# Patient Record
Sex: Male | Born: 1935 | Race: White | Hispanic: No | Marital: Married | Smoking: Former smoker
Health system: Southern US, Community
[De-identification: ages and names within clinical notes are randomized; demographics above are authoritative.]

## PROBLEM LIST (undated history)

## (undated) DIAGNOSIS — I4891 Unspecified atrial fibrillation: Secondary | ICD-10-CM

## (undated) DIAGNOSIS — E039 Hypothyroidism, unspecified: Secondary | ICD-10-CM

## (undated) DIAGNOSIS — I5042 Chronic combined systolic (congestive) and diastolic (congestive) heart failure: Secondary | ICD-10-CM

## (undated) DIAGNOSIS — H4920 Sixth [abducent] nerve palsy, unspecified eye: Principal | ICD-10-CM

## (undated) DIAGNOSIS — I442 Atrioventricular block, complete: Secondary | ICD-10-CM

## (undated) DIAGNOSIS — I10 Essential (primary) hypertension: Secondary | ICD-10-CM

## (undated) DIAGNOSIS — E871 Hypo-osmolality and hyponatremia: Secondary | ICD-10-CM

## (undated) DIAGNOSIS — M810 Age-related osteoporosis without current pathological fracture: Secondary | ICD-10-CM

## (undated) DIAGNOSIS — M519 Unspecified thoracic, thoracolumbar and lumbosacral intervertebral disc disorder: Secondary | ICD-10-CM

## (undated) DIAGNOSIS — Z7901 Long term (current) use of anticoagulants: Secondary | ICD-10-CM

## (undated) DIAGNOSIS — Z435 Encounter for attention to cystostomy: Secondary | ICD-10-CM

## (undated) DIAGNOSIS — J329 Chronic sinusitis, unspecified: Secondary | ICD-10-CM

## (undated) DIAGNOSIS — K219 Gastro-esophageal reflux disease without esophagitis: Secondary | ICD-10-CM

## (undated) DIAGNOSIS — H532 Diplopia: Secondary | ICD-10-CM

## (undated) DIAGNOSIS — H9190 Unspecified hearing loss, unspecified ear: Secondary | ICD-10-CM

## (undated) DIAGNOSIS — M109 Gout, unspecified: Secondary | ICD-10-CM

## (undated) DIAGNOSIS — E669 Obesity, unspecified: Secondary | ICD-10-CM

## (undated) DIAGNOSIS — N452 Orchitis: Secondary | ICD-10-CM

## (undated) DIAGNOSIS — H3530 Unspecified macular degeneration: Secondary | ICD-10-CM

## (undated) DIAGNOSIS — Z950 Presence of cardiac pacemaker: Secondary | ICD-10-CM

## (undated) DIAGNOSIS — J45909 Unspecified asthma, uncomplicated: Secondary | ICD-10-CM

## (undated) DIAGNOSIS — N400 Enlarged prostate without lower urinary tract symptoms: Secondary | ICD-10-CM

## (undated) HISTORY — DX: Diplopia: H53.2

## (undated) HISTORY — DX: Unspecified hearing loss, unspecified ear: H91.90

## (undated) HISTORY — DX: Unspecified macular degeneration: H35.30

## (undated) HISTORY — PX: CATARACT EXTRACTION: SUR2

## (undated) HISTORY — DX: Atrioventricular block, complete: I44.2

## (undated) HISTORY — DX: Unspecified atrial fibrillation: I48.91

## (undated) HISTORY — DX: Sixth (abducent) nerve palsy, unspecified eye: H49.20

## (undated) HISTORY — DX: Gastro-esophageal reflux disease without esophagitis: K21.9

## (undated) HISTORY — PX: TRANSURETHRAL RESECTION OF PROSTATE: SHX73

## (undated) HISTORY — PX: PACEMAKER INSERTION: SHX728

## (undated) HISTORY — PX: OTHER SURGICAL HISTORY: SHX169

---

## 1967-02-28 HISTORY — PX: INGUINAL HERNIA REPAIR: SUR1180

## 1988-02-28 HISTORY — PX: ABLATION: SHX5711

## 1997-06-09 ENCOUNTER — Other Ambulatory Visit: Admission: RE | Admit: 1997-06-09 | Discharge: 1997-06-09 | Payer: Self-pay | Admitting: Cardiology

## 1998-10-23 ENCOUNTER — Emergency Department (HOSPITAL_COMMUNITY): Admission: EM | Admit: 1998-10-23 | Discharge: 1998-10-23 | Payer: Self-pay | Admitting: Emergency Medicine

## 1998-10-24 ENCOUNTER — Encounter: Payer: Self-pay | Admitting: Emergency Medicine

## 2000-07-31 ENCOUNTER — Ambulatory Visit (HOSPITAL_COMMUNITY): Admission: RE | Admit: 2000-07-31 | Discharge: 2000-07-31 | Payer: Self-pay | Admitting: Family Medicine

## 2000-07-31 ENCOUNTER — Encounter: Payer: Self-pay | Admitting: Family Medicine

## 2001-07-11 ENCOUNTER — Encounter (INDEPENDENT_AMBULATORY_CARE_PROVIDER_SITE_OTHER): Payer: Self-pay | Admitting: Specialist

## 2001-07-11 ENCOUNTER — Ambulatory Visit (HOSPITAL_COMMUNITY): Admission: RE | Admit: 2001-07-11 | Discharge: 2001-07-11 | Payer: Self-pay | Admitting: Gastroenterology

## 2003-02-28 DIAGNOSIS — Z950 Presence of cardiac pacemaker: Secondary | ICD-10-CM

## 2003-02-28 HISTORY — DX: Presence of cardiac pacemaker: Z95.0

## 2003-06-29 ENCOUNTER — Encounter: Admission: RE | Admit: 2003-06-29 | Discharge: 2003-06-29 | Payer: Self-pay | Admitting: Cardiology

## 2003-09-16 ENCOUNTER — Encounter: Admission: RE | Admit: 2003-09-16 | Discharge: 2003-09-16 | Payer: Self-pay | Admitting: Orthopedic Surgery

## 2003-11-21 ENCOUNTER — Emergency Department (HOSPITAL_COMMUNITY): Admission: EM | Admit: 2003-11-21 | Discharge: 2003-11-21 | Payer: Self-pay | Admitting: Emergency Medicine

## 2003-11-23 ENCOUNTER — Encounter: Admission: RE | Admit: 2003-11-23 | Discharge: 2003-11-23 | Payer: Self-pay | Admitting: Cardiology

## 2005-02-12 ENCOUNTER — Emergency Department (HOSPITAL_COMMUNITY): Admission: EM | Admit: 2005-02-12 | Discharge: 2005-02-12 | Payer: Self-pay | Admitting: Emergency Medicine

## 2005-07-20 ENCOUNTER — Encounter: Admission: RE | Admit: 2005-07-20 | Discharge: 2005-07-20 | Payer: Self-pay | Admitting: Cardiology

## 2006-07-05 ENCOUNTER — Encounter: Admission: RE | Admit: 2006-07-05 | Discharge: 2006-07-05 | Payer: Self-pay | Admitting: Cardiology

## 2007-07-22 ENCOUNTER — Emergency Department (HOSPITAL_COMMUNITY): Admission: EM | Admit: 2007-07-22 | Discharge: 2007-07-22 | Payer: Self-pay | Admitting: Emergency Medicine

## 2007-08-16 ENCOUNTER — Encounter: Admission: RE | Admit: 2007-08-16 | Discharge: 2007-08-16 | Payer: Self-pay | Admitting: Cardiology

## 2007-09-26 ENCOUNTER — Encounter (INDEPENDENT_AMBULATORY_CARE_PROVIDER_SITE_OTHER): Payer: Self-pay | Admitting: Neurosurgery

## 2007-09-26 ENCOUNTER — Ambulatory Visit: Payer: Self-pay | Admitting: Surgery

## 2007-09-26 ENCOUNTER — Ambulatory Visit (HOSPITAL_COMMUNITY): Admission: RE | Admit: 2007-09-26 | Discharge: 2007-09-26 | Payer: Self-pay | Admitting: Neurosurgery

## 2007-10-01 ENCOUNTER — Ambulatory Visit (HOSPITAL_COMMUNITY): Admission: RE | Admit: 2007-10-01 | Discharge: 2007-10-01 | Payer: Self-pay | Admitting: Neurosurgery

## 2007-11-14 DIAGNOSIS — I10 Essential (primary) hypertension: Secondary | ICD-10-CM | POA: Insufficient documentation

## 2007-11-14 DIAGNOSIS — K219 Gastro-esophageal reflux disease without esophagitis: Secondary | ICD-10-CM | POA: Insufficient documentation

## 2007-11-15 ENCOUNTER — Ambulatory Visit: Payer: Self-pay | Admitting: Pulmonary Disease

## 2007-11-15 DIAGNOSIS — J45901 Unspecified asthma with (acute) exacerbation: Secondary | ICD-10-CM

## 2007-11-15 DIAGNOSIS — R05 Cough: Secondary | ICD-10-CM

## 2007-11-15 DIAGNOSIS — R059 Cough, unspecified: Secondary | ICD-10-CM | POA: Insufficient documentation

## 2007-11-15 DIAGNOSIS — R0602 Shortness of breath: Secondary | ICD-10-CM | POA: Insufficient documentation

## 2007-11-29 ENCOUNTER — Inpatient Hospital Stay (HOSPITAL_COMMUNITY): Admission: EM | Admit: 2007-11-29 | Discharge: 2007-11-30 | Payer: Self-pay | Admitting: Emergency Medicine

## 2007-11-29 ENCOUNTER — Ambulatory Visit: Payer: Self-pay | Admitting: Pulmonary Disease

## 2007-11-29 DIAGNOSIS — J45909 Unspecified asthma, uncomplicated: Secondary | ICD-10-CM | POA: Insufficient documentation

## 2007-12-13 ENCOUNTER — Ambulatory Visit: Payer: Self-pay | Admitting: Pulmonary Disease

## 2007-12-13 DIAGNOSIS — J209 Acute bronchitis, unspecified: Secondary | ICD-10-CM | POA: Insufficient documentation

## 2008-01-20 ENCOUNTER — Ambulatory Visit: Payer: Self-pay | Admitting: Pulmonary Disease

## 2008-02-07 ENCOUNTER — Ambulatory Visit: Payer: Self-pay | Admitting: Pulmonary Disease

## 2008-02-07 ENCOUNTER — Ambulatory Visit: Payer: Self-pay | Admitting: Cardiovascular Disease

## 2008-02-07 LAB — CONVERTED CEMR LAB: INR: 1.8 — ABNORMAL HIGH (ref 0.8–1.0)

## 2008-02-17 ENCOUNTER — Telehealth: Payer: Self-pay | Admitting: Pulmonary Disease

## 2008-02-18 ENCOUNTER — Telehealth: Payer: Self-pay | Admitting: Pulmonary Disease

## 2008-03-16 ENCOUNTER — Encounter: Payer: Self-pay | Admitting: Pulmonary Disease

## 2008-04-24 ENCOUNTER — Encounter: Payer: Self-pay | Admitting: Pulmonary Disease

## 2008-06-12 ENCOUNTER — Telehealth: Payer: Self-pay | Admitting: Pulmonary Disease

## 2008-06-17 ENCOUNTER — Ambulatory Visit: Payer: Self-pay | Admitting: Pulmonary Disease

## 2008-06-17 DIAGNOSIS — J329 Chronic sinusitis, unspecified: Secondary | ICD-10-CM | POA: Insufficient documentation

## 2008-06-18 ENCOUNTER — Encounter: Payer: Self-pay | Admitting: Pulmonary Disease

## 2008-07-06 ENCOUNTER — Emergency Department (HOSPITAL_COMMUNITY): Admission: EM | Admit: 2008-07-06 | Discharge: 2008-07-07 | Payer: Self-pay | Admitting: Emergency Medicine

## 2008-08-05 ENCOUNTER — Emergency Department (HOSPITAL_COMMUNITY): Admission: EM | Admit: 2008-08-05 | Discharge: 2008-08-05 | Payer: Self-pay | Admitting: Emergency Medicine

## 2008-08-05 ENCOUNTER — Encounter: Payer: Self-pay | Admitting: Pulmonary Disease

## 2008-08-19 ENCOUNTER — Ambulatory Visit: Payer: Self-pay | Admitting: Pulmonary Disease

## 2008-12-17 ENCOUNTER — Ambulatory Visit: Payer: Self-pay | Admitting: Pulmonary Disease

## 2009-05-26 ENCOUNTER — Encounter (INDEPENDENT_AMBULATORY_CARE_PROVIDER_SITE_OTHER): Payer: Self-pay | Admitting: *Deleted

## 2010-03-29 NOTE — Letter (Signed)
SummaryBiochemist, clinical Pulmonary Care Appointment Letter  [facility]  [address], [place] [address]   Phone: [phone]  Fax: [phone]    05/26/2009 MRN: [id number]  [facility] [address] [place], [place]  [address]  Dear Mr. Resor,   Our office is attempting to contact you about an appointment.  Please call our office at [phone] to schedule this appointment with Dr. Danton Sewer.  You had an appointment schedulled for June 17, 2009 that has been cancelled.  Please call us to reschedule this appointment.  Our registration staff is prepared to assist you with any questions you may have.    Thank you,   Therapist, music Pulmonary Division

## 2010-06-06 LAB — PROTIME-INR
INR: 3.6 — ABNORMAL HIGH (ref 0.00–1.49)
Prothrombin Time: 39.1 seconds — ABNORMAL HIGH (ref 11.6–15.2)

## 2010-06-06 LAB — CBC
HCT: 40 % (ref 39.0–52.0)
MCHC: 33.6 g/dL (ref 30.0–36.0)
MCV: 91.5 fL (ref 78.0–100.0)

## 2010-06-06 LAB — DIFFERENTIAL
Basophils Absolute: 0 10*3/uL (ref 0.0–0.1)
Eosinophils Absolute: 0.1 10*3/uL (ref 0.0–0.7)
Eosinophils Relative: 2 % (ref 0–5)
Lymphocytes Relative: 27 % (ref 12–46)
Lymphs Abs: 1.5 10*3/uL (ref 0.7–4.0)
Monocytes Relative: 8 % (ref 3–12)
Neutro Abs: 3.5 10*3/uL (ref 1.7–7.7)
Neutrophils Relative %: 63 % (ref 43–77)

## 2010-06-06 LAB — POCT I-STAT, CHEM 8
BUN: 22 mg/dL (ref 6–23)
Glucose, Bld: 145 mg/dL — ABNORMAL HIGH (ref 70–99)
HCT: 41 % (ref 39.0–52.0)
TCO2: 27 mmol/L (ref 0–100)

## 2010-06-07 LAB — APTT: aPTT: 44 seconds — ABNORMAL HIGH (ref 24–37)

## 2010-06-07 LAB — POCT I-STAT, CHEM 8
Calcium, Ion: 1.01 mmol/L — ABNORMAL LOW (ref 1.12–1.32)
Hemoglobin: 13.6 g/dL (ref 13.0–17.0)
Potassium: 4.2 mEq/L (ref 3.5–5.1)
Sodium: 132 mEq/L — ABNORMAL LOW (ref 135–145)

## 2010-06-15 ENCOUNTER — Emergency Department (HOSPITAL_COMMUNITY): Payer: Medicare Other

## 2010-06-15 ENCOUNTER — Emergency Department (HOSPITAL_COMMUNITY)
Admission: EM | Admit: 2010-06-15 | Discharge: 2010-06-15 | Disposition: A | Discharge status: Home / Self Care | Payer: Medicare Other | Attending: Emergency Medicine | Admitting: Emergency Medicine

## 2010-06-15 DIAGNOSIS — Z7901 Long term (current) use of anticoagulants: Secondary | ICD-10-CM | POA: Insufficient documentation

## 2010-06-15 DIAGNOSIS — R0789 Other chest pain: Secondary | ICD-10-CM | POA: Insufficient documentation

## 2010-06-15 DIAGNOSIS — I10 Essential (primary) hypertension: Secondary | ICD-10-CM | POA: Insufficient documentation

## 2010-06-15 DIAGNOSIS — Z862 Personal history of diseases of the blood and blood-forming organs and certain disorders involving the immune mechanism: Secondary | ICD-10-CM | POA: Insufficient documentation

## 2010-06-15 DIAGNOSIS — Z79899 Other long term (current) drug therapy: Secondary | ICD-10-CM | POA: Insufficient documentation

## 2010-06-15 DIAGNOSIS — R5383 Other fatigue: Secondary | ICD-10-CM | POA: Insufficient documentation

## 2010-06-15 DIAGNOSIS — I2510 Atherosclerotic heart disease of native coronary artery without angina pectoris: Secondary | ICD-10-CM | POA: Insufficient documentation

## 2010-06-15 DIAGNOSIS — R0602 Shortness of breath: Secondary | ICD-10-CM | POA: Insufficient documentation

## 2010-06-15 DIAGNOSIS — Z8639 Personal history of other endocrine, nutritional and metabolic disease: Secondary | ICD-10-CM | POA: Insufficient documentation

## 2010-06-15 DIAGNOSIS — N289 Disorder of kidney and ureter, unspecified: Secondary | ICD-10-CM | POA: Insufficient documentation

## 2010-06-15 DIAGNOSIS — Z950 Presence of cardiac pacemaker: Secondary | ICD-10-CM | POA: Insufficient documentation

## 2010-06-15 DIAGNOSIS — R110 Nausea: Secondary | ICD-10-CM | POA: Insufficient documentation

## 2010-06-15 DIAGNOSIS — R5381 Other malaise: Secondary | ICD-10-CM | POA: Insufficient documentation

## 2010-06-15 DIAGNOSIS — R42 Dizziness and giddiness: Secondary | ICD-10-CM | POA: Insufficient documentation

## 2010-06-15 LAB — COMPREHENSIVE METABOLIC PANEL
Alkaline Phosphatase: 53 U/L (ref 39–117)
BUN: 30 mg/dL — ABNORMAL HIGH (ref 6–23)
Chloride: 99 mEq/L (ref 96–112)
Glucose, Bld: 117 mg/dL — ABNORMAL HIGH (ref 70–99)
Potassium: 5.1 mEq/L (ref 3.5–5.1)
Total Bilirubin: 0.8 mg/dL (ref 0.3–1.2)

## 2010-06-15 LAB — CBC
HCT: 37.5 % — ABNORMAL LOW (ref 39.0–52.0)
Platelets: 209 10*3/uL (ref 150–400)
RBC: 4.14 MIL/uL — ABNORMAL LOW (ref 4.22–5.81)
RDW: 13.3 % (ref 11.5–15.5)
WBC: 9.3 10*3/uL (ref 4.0–10.5)

## 2010-06-15 LAB — DIFFERENTIAL
Basophils Absolute: 0.1 10*3/uL (ref 0.0–0.1)
Eosinophils Relative: 2 % (ref 0–5)
Lymphocytes Relative: 18 % (ref 12–46)
Neutrophils Relative %: 72 % (ref 43–77)

## 2010-06-15 LAB — PROTIME-INR: INR: 2.85 — ABNORMAL HIGH (ref 0.00–1.49)

## 2010-06-15 LAB — POCT CARDIAC MARKERS: CKMB, poc: <1 ng/mL — ABNORMAL LOW (ref 1.0–8.0)

## 2010-06-15 LAB — D-DIMER, QUANTITATIVE: D-Dimer, Quant: 0.24 ug/mL-FEU (ref 0.00–0.48)

## 2010-06-15 LAB — URINALYSIS, ROUTINE W REFLEX MICROSCOPIC
Ketones, ur: NEGATIVE mg/dL
Nitrite: NEGATIVE
Protein, ur: NEGATIVE mg/dL

## 2010-06-15 LAB — APTT: aPTT: 49 seconds — ABNORMAL HIGH (ref 24–37)

## 2010-06-17 ENCOUNTER — Inpatient Hospital Stay (HOSPITAL_COMMUNITY): Payer: Medicare Other

## 2010-06-17 ENCOUNTER — Inpatient Hospital Stay (HOSPITAL_COMMUNITY)
Admission: AD | Admit: 2010-06-17 | Discharge: 2010-06-18 | DRG: 149 | Disposition: A | Discharge status: Home / Self Care | Payer: Medicare Other | Source: Ambulatory Visit | Attending: Cardiology | Admitting: Cardiology

## 2010-06-17 ENCOUNTER — Encounter (HOSPITAL_COMMUNITY): Payer: Self-pay | Admitting: Radiology

## 2010-06-17 DIAGNOSIS — I10 Essential (primary) hypertension: Secondary | ICD-10-CM | POA: Diagnosis present

## 2010-06-17 DIAGNOSIS — R0789 Other chest pain: Secondary | ICD-10-CM | POA: Diagnosis present

## 2010-06-17 DIAGNOSIS — Z87891 Personal history of nicotine dependence: Secondary | ICD-10-CM

## 2010-06-17 DIAGNOSIS — E785 Hyperlipidemia, unspecified: Secondary | ICD-10-CM | POA: Diagnosis present

## 2010-06-17 DIAGNOSIS — R42 Dizziness and giddiness: Principal | ICD-10-CM | POA: Diagnosis present

## 2010-06-17 DIAGNOSIS — E039 Hypothyroidism, unspecified: Secondary | ICD-10-CM | POA: Diagnosis present

## 2010-06-17 DIAGNOSIS — Z950 Presence of cardiac pacemaker: Secondary | ICD-10-CM

## 2010-06-17 DIAGNOSIS — I4891 Unspecified atrial fibrillation: Secondary | ICD-10-CM | POA: Diagnosis present

## 2010-06-17 DIAGNOSIS — E871 Hypo-osmolality and hyponatremia: Secondary | ICD-10-CM | POA: Diagnosis present

## 2010-06-17 DIAGNOSIS — J45909 Unspecified asthma, uncomplicated: Secondary | ICD-10-CM | POA: Diagnosis present

## 2010-06-17 DIAGNOSIS — Z79899 Other long term (current) drug therapy: Secondary | ICD-10-CM

## 2010-06-17 DIAGNOSIS — Z7901 Long term (current) use of anticoagulants: Secondary | ICD-10-CM

## 2010-06-17 DIAGNOSIS — N400 Enlarged prostate without lower urinary tract symptoms: Secondary | ICD-10-CM | POA: Diagnosis present

## 2010-06-17 DIAGNOSIS — M810 Age-related osteoporosis without current pathological fracture: Secondary | ICD-10-CM | POA: Diagnosis present

## 2010-06-17 DIAGNOSIS — E669 Obesity, unspecified: Secondary | ICD-10-CM | POA: Diagnosis present

## 2010-06-17 DIAGNOSIS — I498 Other specified cardiac arrhythmias: Secondary | ICD-10-CM | POA: Diagnosis present

## 2010-06-17 DIAGNOSIS — N179 Acute kidney failure, unspecified: Secondary | ICD-10-CM | POA: Diagnosis present

## 2010-06-17 HISTORY — DX: Essential (primary) hypertension: I10

## 2010-06-17 HISTORY — DX: Presence of cardiac pacemaker: Z95.0

## 2010-06-17 LAB — BASIC METABOLIC PANEL
CO2: 25 mEq/L (ref 19–32)
Calcium: 8.8 mg/dL (ref 8.4–10.5)
Creatinine, Ser: 1.51 mg/dL — ABNORMAL HIGH (ref 0.4–1.5)
Glucose, Bld: 126 mg/dL — ABNORMAL HIGH (ref 70–99)
Sodium: 128 mEq/L — ABNORMAL LOW (ref 135–145)

## 2010-06-17 LAB — CARDIAC PANEL(CRET KIN+CKTOT+MB+TROPI)
CK, MB: 1.7 ng/mL (ref 0.3–4.0)
Relative Index: INVALID (ref 0.0–2.5)
Relative Index: INVALID (ref 0.0–2.5)
Total CK: 85 U/L (ref 7–232)
Troponin I: 0.02 ng/mL (ref 0.00–0.06)

## 2010-06-17 LAB — TSH: TSH: 3.013 u[IU]/mL (ref 0.350–4.500)

## 2010-06-17 LAB — CBC
MCHC: 34.4 g/dL (ref 30.0–36.0)
RDW: 13.1 % (ref 11.5–15.5)

## 2010-06-21 NOTE — Discharge Summary (Signed)
NAMEELIZEO, KAHLER NO.:  [id number]  MEDICAL RECORD NO.:  [id number]           PATIENT TYPE:  I  LOCATION:  [id number]                         FACILITY:  [facility]  PHYSICIAN:  Jerline Pain, MD      DATE OF BIRTH:  [birth year]  DATE OF ADMISSION:  06/17/2010 DATE OF DISCHARGE:  06/18/2010                              DISCHARGE SUMMARY   PRIMARY CARE PHYSICIAN:  Janalyn Rouse, MD  CARDIOLOGIST:  Ezzard Standing, MD  FINAL DIAGNOSES: 1. Dizziness. 2. Atypical chest pain. 3. Atrial fibrillation. 4. Chronic anticoagulation. 5. Hyperlipidemia. 6. Hypertension. 7. Obesity. 8. Supraventricular tachycardia. 9. Pacemaker.  Discharge medications changes are as follows:  Flecainide has been decreased from 100 mg twice a day to 50 mg twice a day.  Also triamterene/hydrochlorothiazide is currently on hold.  This may need to be restarted as an outpatient.  These were the two medication changes made.  Other medications remain the same and are as follows:  The addition of meclizine 25 mg p.r.n. q.8 h. has also been made by consultant, Dr. Brigitte Pulse, his primary care physician.  DISCHARGE MEDICATIONS: 1. Flecainide 50 mg twice a day. 2. Meclizine 25 mg p.o. q.8 h. p.r.n. 3. Nasonex once per each nostril daily. 4. Loratadine 10 mg once a day. 5. Omeprazole 20 mg once a day. 6. Multivitamins once a day. 7. Finasteride 5 mg one tablet daily. 8. Levothyroxine 75 mcg one tablet daily. 9. Losartan 100 mg once a day. 10.Warfarin 1.5 mg on Thursdays, 3 mg every other day of the week as     previously prescribed. 11.Allopurinol 300 mg once a day. 12.Alendronate 70 mg every Wednesday. 13.Vitamin D3. 14.Citrucel. 15.MiraLax. 16.Rapaflo 8 mg one tablet nightly. 17.Colchicine 0.6 mg once a day. 18.Vicodin 5/500 one tablet q.6 h. 19.Tramadol 50 mg twice a day. 20.Flurazepam one p.o. nightly as needed for sleep. 21.Fish oil 1000 mg once a day. 22.Calcium with vitamin  D. 23.Ocuvite. 24.Symbicort 160/4.5 mcg two puffs inhale b.i.d.  BRIEF HOSPITAL COURSE:  Mr. Hissam is a pleasant 77 year old male who over the past few days has been having complaints of vague malaise, fatigue, dizziness.  He was seen previously in the emergency department, sent home.  He then sought the counsel of [name] and Dr. Brigitte Pulse. Given his Coumadin usage and his vague complaints including some atypical chest discomfort, he was admitted to the hospital for further evaluation.  Head CT was performed, which was negative for bleed.  He is on chronic Coumadin.  His BUN and creatinine was 30 and 1.6 on June 15, 2010 and on discharge was 23 and 1.51.  Orthostatics were performed in the hospital setting and were negative. Lying down, he was 150/82 with a pulse of 75 and standing he was 147/74 with pulse of 74.  He was remained afebrile.  Urinalysis was normal and INR was therapeutic at 2.5 for his atrial fibrillation.  Cardiac biomarkers were also drawn, which were negative.  TSH was normal at 3.0. ESR was slightly elevated at 22 and his BNP was also elevated at 332.  D- dimer was negative.  On morning  of discharge, he actually stated that he was feeling quite well and was eager to go home.  He was ambulating well without any issues.  Dr. Brigitte Pulse saw him in consultation yesterday on June 17, 2010 and recommended gentle hydration as well as p.r.n. meclizine.  Perhaps his dizziness was secondary at the time due to intravascular volume depletion.  [name] also placed his triamterene/hydrochlorothiazide on hold due to increased BUN and creatinine and also decreased his flecainide from 100 twice a day to 50 twice a day.  PHYSICAL EXAMINATION ON MORNING OF DISCHARGE:  VITAL SIGNS:  Pulse 75, respirations 18, blood pressure 129/71, satting 94% on room air. GENERAL:  Alert and oriented x3, in no acute distress, ambulating well. CARDIOVASCULAR:  Regular rate and rhythm.  Soft  systolic murmur. LUNGS:  Clear to auscultation bilaterally. ABDOMEN:  Soft, nontender.  Normoactive bowel sounds. EXTREMITIES:  No clubbing, cyanosis, or edema.  Telemetry unremarkable, occasional atrial pacing, but mostly persistent ventricular pacing.  ASSESSMENT:  No adverse arrhythmias noted.  FOLLOW UP:  I asked him to call both [name] and Dr. Raul Del office early next week for followup appointment.  Head CT as noted above was negative for any bleed, no intracranial process noted.  Discharge time 35 minutes spent with med reconciliation, the patient instruction, review of medical records and labs.     Jerline Pain, MD     MCS/MEDQ  D:  06/18/2010  T:  06/18/2010  Job:  [id number]  cc:   Janalyn Rouse, M.D. Ezzard Standing, M.D.  Electronically Signed by Candee Furbish MD on 06/21/2010 08:56:38 PM

## 2010-06-23 NOTE — Consult Note (Signed)
NAMEAUBREE, [name] NO.:  [id number]  MEDICAL RECORD NO.:  [id number]           PATIENT TYPE:  I  LOCATION:  [id number]                         FACILITY:  [facility]  PHYSICIAN:  Janalyn Rouse, M.D.  DATE OF BIRTH:  Jan 12, 1923  DATE OF CONSULTATION:  06/17/2010 DATE OF DISCHARGE:                                CONSULTATION   CHIEF COMPLAINT:  Dizziness.  REASON FOR CONSULTATION:  Dizziness.  REQUESTING PHYSICIAN:  W. Tollie Eth, MD  HISTORY OF PRESENT ILLNESS:  Mr. Hubbard is an 77 year old white male with a history of atrial fibrillation status post pacemaker on anticoagulation, asthma, and history of syncope who was admitted by [name] for evaluation of atypical chest pain and persistent dizziness. Mr. Marocco was actually seen in the emergency department 2 days ago for evaluation of dizziness.  He spent the weekend in [place] where he spent a lot of time walking.  He states while he was up there he had some intermittent episodes of dizziness.  Two days ago while at the [facility], he developed some additional dizziness, which he described as blurred vision, lightheadedness associated with some chest tightness. He does state that he had some GI upset as well, but no diarrhea.  Given these symptoms, he was evaluated in the emergency department where was found to have a drop in his systolic blood pressure from 135 lying to 119 sitting, and his BUN and creatinine were elevated from baseline.  It was felt that this was most consistent with orthostasis.  He did follow up with me in the office today following this episode, and I concurred that this was most likely orthostasis.  I encouraged him to increase his fluid intake and to call if his symptoms persisted.  The patient states that this morning he developed some left-sided chest pressure that lasted 2-3 hours.  He stated that he wanted to "check out the pacemaker," so the was seen by [name] who recommended  admission for further evaluation.  I was called by [name] for assistance with medical management.  PAST MEDICAL HISTORY: 1. Atrial fibrillation on chronic anticoagulation. 2. Asthma, well controlled. 3. Hypertension. 4. Hypothyroidism. 5. Chronic hyponatremia in the setting of diuretic therapy. 6. BPH. 7. Obesity. 8. Status post pacemaker placement. 9. Chronic low back pain. 10.History of syncope. 11.Impaired fasting glucose/borderline diabetes. 12.Osteoporosis. 13.History of hyperplastic polyps.  SOCIAL HISTORY:  He is remarried to [place].  His first wife is deceased.  He has one son and one daughter and five grandchildren.  He is retired from [employer] since 1984.  Remote tobacco use and no alcohol use.  FAMILY HISTORY:  Father died of an accident at 75.  Mother died of heart failure at 39.  CURRENT MEDICATIONS: 1. Colchicine 0.6 mg p.r.n. gout. 2. Finasteride 5 mg daily. 3. Flecainide 100 mg b.i.d. 4. Levoxyl 75 mcg daily. 5. Prilosec 20 mg daily. 6. Maxzide 37.5/25 daily. 7. Coumadin 3 mg for 6 days then 1/2 on Wednesdays. 8. Symbicort 160/4.5 b.i.d. 9. Xopenex HFA p.r.n. 10.Colace 100 mg daily. 11.Fosamax 70 mg weekly. 12.Vitamin D 1000 units daily. 13.Allopurinol 300 mg daily.  14.Lutein 20 mg daily. 15.Losartan 100 mg daily. 16.Rapaflo 8 mg daily p.r.n. 17.Hydrocodone p.r.n. 18.Fish oil daily. 19.Ultram 50 mg q.8 h. p.r.n. 20.Nasonex p.r.n. (not taken recently). 21.Proctosol-HC p.r.n. 22.Lidoderm patch p.r.n. 23.Claritin 10 mg daily.  PHYSICAL EXAMINATION:  VITAL SIGNS:  Temperature 98.9, pulse 67, respirations 19, blood pressure 129/71, oxygen saturation 94% on room air. GENERAL:  Pleasant, healthy-appearing male in no acute distress. HEENT:  Oropharynx is moist.  Extraocular movements are intact.  No scleral icterus. NECK:  Supple without lymphadenopathy, JVD, or carotid bruits. HEART:  Regular rate and rhythm without murmurs, rubs, or  gallops. LUNGS:  Clear to auscultation bilaterally. ABDOMEN:  Soft, nondistended, nontender with normoactive bowel sounds. EXTREMITIES:  No clubbing, cyanosis, or edema. NEUROLOGIC:  Alert and oriented x4.  Motor strength 5/5 in all extremities.  No ataxia or finger-to-nose abnormalities.  LABORATORY DATA:  CBC shows a white count of 9.0, hemoglobin 12.2, platelets 194.  BMET shows sodium 128, potassium 4.5, chloride 97, bicarb 25, BUN 23, creatinine 1.5, glucose 126, troponin 0.02.  ASSESSMENT/PLAN: 1. Dizziness - his history and recent evaluation are most consistent     with orthostasis in the setting of diuretic use and mild volume     depletion with recent travel.  I do agree with the head CT to rule     out bleed given the persistence of symptoms in the setting of     Coumadin therapy.  Differential diagnosis does include mild vertigo     secondary to allergies.  We will continue his Claritin and Flonase     and add meclizine. 2. Asthma - this is well controlled.  We will continue his home     medications. 3. Acute renal failure - he has had a slight increase in his     creatinine from baseline.  We will monitor with gentle hydration. 4. Atypical chest pain per [name]. 5. Disposition - per [name]. 6. I appreciate the care by [name], and we will follow his     inpatient course and provide close outpatient followup upon     discharge.     Janalyn Rouse, M.D.     WS/MEDQ  D:  06/17/2010  T:  06/18/2010  Job:  [id number]  cc:   Ezzard Standing, M.D.  Electronically Signed by Viona Gilmore. Lutricia Feil M.D. on 06/23/2010 08:48:53 AM

## 2010-07-12 NOTE — Discharge Summary (Signed)
[name], MOSKWA NO.:  [id number]   MEDICAL RECORD NO.:  [id number]          PATIENT TYPE:  INP   LOCATION:  [id number]                         FACILITY:  [facility]   PHYSICIAN:  Ezzard Standing, M.D.DATE OF BIRTH:  1923/01/22   DATE OF ADMISSION:  11/29/2007  DATE OF DISCHARGE:  11/30/2007                               DISCHARGE SUMMARY   FINAL DIAGNOSES:  1. Syncopal episode, presumed vasovagal.      a.     No arrhythmias noted.  The pacemaker evaluation shows normal       pacemaker function with no tachyarrhythmia previous.  2. Paroxysmal atrial fibrillation, currently in sinus rhythm.  3. Over anticoagulation with Coumadin.  4. Hypertension.  5. History of supraventricular tachycardia.  6. Previous history of asthmatic bronchitis with recent exacerbation.   HISTORY:  The patient is an 77 year old male who has had a 42-month  history of asthmatic bronchitis with exacerbation.  He has received  multiple courses of antibiotics as well as steroids recently.  He was in  his usual state of health and went for routine visit to the doctor's  office yesterday.  He was seen by the doctor and then afterwards while  sitting in an examination table, he then got up, went, found a chair and  had promontory symptoms of nausea, sweating, and diaphoresis and then  had a syncopal episode.  He was assisted before and regained  consciousness.  By the time they got the crash card and there was blood  pressure returned normal and he had regained consciousness and was  transported here.  Please see the previously dictated history and  physical for remainder of the details.   HOSPITAL COURSE:  On admission, his white count is 15,400, hemoglobin is  13.6, hematocrit is 40.  Protime INR was 4.1, sodium is 128, potassium  4.3, chloride 93, CO2 27, BUN 25, creatinine 1.2, glucose 150.  Liver  enzymes were normal.  TSH is 4.667.  CPK-MB and troponin were all  normal.   The patient was  observed and his Coumadin was held and his Flomax was  held.  It was noted that he was taken intermittent Flomax dosages and we  asked him to stop taking this.  His symptoms improved and he had no  arrhythmias.  Pacemaker evaluation showed normal pacemaker function.  Last episode of mode switching was on November 21, 2007.  It was  thought the most likely etiology of his syncope was vasovagal, likely  due also to the recent illness that he had as well as the Flomax that he  had taken the night before, which he was taken only on intermittent  basis.  He is discharged at this time in improved condition.  He does  have hyponatremia and we will stop his diuretics at this time.  He is  discharged on:  1. Flecainide 100 mg twice a day.  2. Levothroid 0.05 mg daily.  3. Prilosec 20 mg daily.  4. Lutein 45 mg daily.  5. Diovan 160 mg daily.  6. Stool softener daily.  7. Multivitamins daily.  8. Finasteride  5 mg nightly.  9. He is to hold Coumadin and have a repeat protime on Monday.  10.Flomax.  11.He is to discontinue colchicine 0.6 mg daily.   He is to follow up with a visit for protime and a 30-day event monitor  on Monday.  He will also have an echocardiogram done as an outpatient it  was the one ordered while he was in the hospital, it has not been done  yet.      Ezzard Standing, M.D.  Electronically Signed     WST/MEDQ  D:  11/30/2007  T:  11/30/2007  Job:  [id number]   cc:   Janalyn Rouse, M.D.

## 2010-07-12 NOTE — H&P (Signed)
[name], [name] NO.:  [id number]   MEDICAL RECORD NO.:  [id number]          PATIENT TYPE:  INP   LOCATION:  [place]                         FACILITY:  [facility]   PHYSICIAN:  Ezzard Standing, M.D.DATE OF BIRTH:  18-Nov-1922   DATE OF ADMISSION:  11/29/2007  DATE OF DISCHARGE:                              HISTORY & PHYSICAL   REASON FOR ADMISSION:  Syncope.   HISTORY:  The patient is a 77 year old male who has a previous history  of paroxysmal atrial fibrillation, hypertension, hyperlipidemia and a  history of supraventricular tachycardia.  He was last seen in June.  He  has a previous history of a [employer] permanent pacemaker implantation  in March of 2005 and has also had paroxysmal atrial fibrillation.  He  has a history of supraventricular tachycardia with an AV node ablation  for this at [facility] in 1988.  He has had previous cardiac  catheterization in 1989 that showed no significant coronary artery  disease and is followed.  He was last seen in June at which time he was  having severe low back pain.  He has been evaluated since then with  normal arterial brachial indices and was seen by Hosie Spangle,  M.D. who felt that he multilevel degenerative disease of the spine that  was not amenable to surgery and recommended medical treatment.  His  pacemaker was checked recently and he was having minimal mode switching.  Over the past months he has had developed severe bronchitis which was  asthmatic bronchitis and was treated with multiple courses of  antibiotics by Janalyn Rouse, M.D. including prednisone.  He received  Zithromax and Avelox, as well as other antibiotics and was eventually  referred to Kathee Delton, MD, [facility] who treated him with prolonged  course of steroids and ultimately resulted in improvement in his  symptoms.  He was at Dr. Janifer Adie office today for routine visit and Dr.  Gwenette Greet wanted him instructed in inhaler therapy.  The  patient had been  sitting on the examination table, got up and moved to a chair in the  examination room and had been sitting there a while and then began to  feel somewhat diaphoretic and sweaty and then dizzy and had a syncopal  episode in the chair.  The wife stepped out and called the nurse who  came into the room and they assisted the patient to the floor after  which he regained consciousness.  He was reported as being apneic and  pulseless, but no CPR was done and when they hooked the monitor up to  him he was in a regular rhythm and had a blood pressure of 120/80 and  was transported to the emergency room.  Evaluation in the emergency room  has shown a paced ventricular rhythm and he also has had a sodium of  128, a white count of 15,000.  Pro time INR was 4.  I was asked to see  him.  The patient has not had any recent syncopal episodes and denies  any significant chest pain.  He has no PND or  orthopnea.  He did take a  dose of Flomax last evening and did eat breakfast today.  He takes  Flomax intermittently for symptoms of BPH.  He has been on chronic  Coumadin therapy without complications.   PAST MEDICAL HISTORY:  Medical:  1. Previous history of hypertension.  2. Obesity.  3. History of BPH.  4. Asthma.  5. Severe lumbar disk disease.  6. Gout.  7. Previous history of hypothyroidism.  8. Previous cardiovascular illnesses include a history of      supraventricular tachycardia with previous history of AV nodal      ablation for SVT in [facility] in 1988 by Dr. Rolland Porter.  A pacemaker was      implanted in [place] for a second-degree heart block, April of 2005   Previous surgery:  1. Inguinal herniorrhaphy bilaterally.  2. TURP.   ALLERGIES:  SULFONAMIDES.   CURRENT MEDICATIONS:  1. Prilosec OTC daily.  2. Diovan 160 mg daily.  3. Flecainide 100 b.i.d.  4. Hydrochlorothiazide and triamterene 25/37.5 daily.  5. Levothyroxine 0.05 mg daily.  6. Lutein 45 mg daily.  7.  Surfak daily.  8. Warfarin 2.5 mg daily except for 5 mg twice weekly.  9. Colchicine 0.6 mg daily.  10.Finasteride daily.  11.He does take intermittent Flomax also.   SOCIAL HISTORY:  He is a retired Corporate treasurer.  He  has never smoked.  He does not use alcohol to excess.  Currently lives  with his second wife.   FAMILY HISTORY:  Negative for premature cardiac disease.   REVIEW OF SYSTEMS:  He has been obese for several years.  He denies  significant eye complaints or eye problems.  He has some moderate  dyspnea with exertion.  No definite chest pain suggestive of angina.  He  has a history of mild esophageal reflux type symptoms in the past and a  history of some mild dyspepsia.  He has a history of an elevated PSA.  He has had prostatitis and urinary retention in the past and  intermittently will take Flomax.  He has a history of significant lumbar  disk disease and also has plantar fasciitis by history.  He has no  history of headaches or seizures other than as noted above.  The  remainder of systems is unremarkable.   PHYSICAL EXAMINATION:  He is a pleasant male appearing younger than  stated age.  Blood pressure standing by me in the emergency room was 140/70, pulse  was 70 and regular.  His skin was warm and dry.  There was no obvious signs of trauma.  HEENT:  EOMI.  PERLLA.  CNS clear.  Fundi not examined.  Pharynx  negative.  NECK:  Supple without masses, JVD, thyromegaly or bruits.  LUNGS:  Clear to A and P.  CARDIOVASCULAR EXAM:  Shows normal S1 and S2.  Tere is no S3-S4.  There  is a soft 1/6 systolic murmur.  ABDOMEN:  Soft and nontender.  Femoral distal pulses are 2+.  There was  no peripheral edema noted.   The 12-lead EKG shows a paced rhythm.   Lab data is as noted in the history and physical.   IMPRESSION:  1. Syncope that by description sounds vasovagal and by description the      recent dose of Flomax may have contributed to this.  2.  Hypertension.  3. Hyponatremia.  4. Over-anticoagulation with Coumadin.  5. History of ablation for supraventricular tachycardia.  6. History of second-degree  block and implantation of pacemaker.  7. Atrial fibrillation, paroxysmal on flecainide.  8. Obesity.  9. Chronic low back pain.  10.Gout.  11.Hypothyroidism.  12.Prostatism and benign prostate hypertrophy.   RECOMMENDATIONS:  The patient will have an echocardiogram.  We will hold  his Coumadin at this time and also hold his Maxzide.  Follow-up  hyponatremia.  Obtain orthostatic blood pressures and rule out  myocardial infarction although I doubt this is a clinical event.  Recommended that he likely just has a vagal reaction that may have been  exacerbated by Flomax.  We will obtain a pacer interrogation and  determine if there are any issues there.      Ezzard Standing, M.D.  Electronically Signed     WST/MEDQ  D:  11/29/2007  T:  11/29/2007  Job:  [id number]   cc:   Janalyn Rouse, M.D.

## 2010-07-15 NOTE — Consult Note (Signed)
NAMESEVERO, BRIAN NO.:  [id number]   MEDICAL RECORD NO.:  [id number]          PATIENT TYPE:  EMS   LOCATION:  [place]                         FACILITY:  [facility]   PHYSICIAN:  Kaylyn Lim., M.D.DATE OF BIRTH:  11-10-1922   DATE OF CONSULTATION:  11/21/2003  DATE OF DISCHARGE:                                   CONSULTATION   HISTORY OF PRESENT ILLNESS:  A 77 year old white male with a past medical  history of atrial fibrillation (status post AV node ablation), permanent  pacemaker placement, hypertension who presents for evaluation of shortness  of breath.  The patient reports recent trip to the [place] and [place].  Since that time, he has been having productive cough, increased shortness of  breath, malaise, and wheezing.  Reports that his chest has been full,  worse with exertion.  Tried Cipro without significant improvement.  Also  reports twitchy airways of the same time period.  Question plus / minus  orthopnea, no PND.  No lower extremity edema.  No chest pain.   CURRENT MEDICATIONS:  Current medications are Diovan, Cardura and Nexium.   ALLERGIES:  None.   SOCIAL HISTORY:  He is married.  No tobacco or alcohol.   FAMILY HISTORY:  Denies coronary disease.   PHYSICAL EXAMINATION:  VITAL SIGNS:  Afebrile, pulse in the 60s, telemetry  shows ventricular paced blood pressure 140/70, sats were 95%.  GENERAL:  Alert and oriented times four.  Elderly white male in no acute  distress.  NECK:  Supple.  No lymphadenopathy.  There are 2+ carotids.  No JVD.  LUNGS:  Bilateral rhonchi, primarily in the bases with wheezing.  HEART:  Regular.  ABDOMEN:  Soft, nontender, nondistended.  EXTREMITIES:  Warm, 2+ pulses and only trace edema.   LABORATORY DATA:  Significant laboratory values show a white count of 5.1,  hemoglobin of 12.2, platelet count 167, BUN and creatinine 21 and 1.2.  LFTs  were normal.  BNP is 51, cardiac enzymes are negative times two.  EKG  is  prepaced.  Chest x-ray shows no acute disease and pacemaker in proper  position.   IMPRESSION:  Bronchitis.   PLAN:  Recommend starting prednisone, azithromycin and Atrovent.  Patient  reports shakes with albuterol.  Prescriptions were called in to [facility].  Patient to follow up in one to two weeks with Dr. Raelene Bott.  He was instructed to notify the office should his breathing worsen or his  symptoms do not improve within the next 24 hours.       TWK/MEDQ  D:  11/21/2003  T:  11/22/2003  Job:  [id number]

## 2010-07-15 NOTE — Procedures (Signed)
[facility]  Patient:    [name], GAIR Visit Number: [id number] MRN: [id number]          Service Type: END Location: ENDO Attending Physician:  Orvis Brill Dictated by:   Jeryl Columbia, M.D. Proc. Date: 07/11/01 Admit Date:  07/11/2001   CC:         Biagio Borg, M.D.   Procedure Report  PROCEDURE:  Colonoscopy with biopsy.  INDICATION:  Patient with history of colon polyps, due for repeat screening. Consent was signed after risks, benefits, methods, and options thoroughly discussed in the office on multiple occasions.  MEDICATIONS:  Demerol 60, Versed 6.  DESCRIPTION OF PROCEDURE:  Rectal inspection was pertinent for external hemorrhoids, small.  Digital exam was negative.  The video colonoscope was inserted, easily advanced around the colon to the cecum.  This did require some abdominal pressure but no position changes.  On insertion, some left-sided diverticula were seen.  The cecum was identified by the appendiceal orifice and the ileocecal valve.  The scope was slowly withdrawn.  On slow withdrawal through the colon, the cecum and the ascending and the transverse were normal.  There was an occasional left-sided diverticula.  On the left side of the colon, probably in the distal descending, two tiny, probable hyperplastic-appearing polyps were seen and were each cold biopsied x 2.  The scope was further withdrawn through the sigmoid. Occasional diverticula were seen in the distal sigmoid and rectum.  A few other hyperplastic-appearing polyps were seen and were cold biopsied as well and put in with the other polyps.  The scope was withdrawn back to the rectum and retroflexed, pertinent for some internal hemorrhoids.  The scope was straightened and readvanced a short ways up the left side of the colon; air was suctioned and the scope removed.   The patient tolerated the procedure well.  There was no obvious immediate  complication.  ENDOSCOPIC DIAGNOSES: 1. Internal/external hemorrhoids. 2. Left occasional diverticula. 3. Rectal distal sigmoid and probably distal descending, probably    hyperplastic-appearing polyps, all cold biopsied. 4. Otherwise within normal limits to the cecum.  PLAN: 1. Await pathology but probably recheck screening in five years if doing well    medically. 2. Otherwise return care to Dr. Valere Dross for the customary health care    maintenance to include yearly rectals and guaiacs. 3. I would be happy to see back sooner p.r.n. Dictated by:   Jeryl Columbia, M.D. Attending Physician:  Orvis Brill DD:  07/11/01 TD:  07/13/01 Job: [phone] [id number]

## 2010-08-25 ENCOUNTER — Telehealth: Payer: Self-pay | Admitting: Pulmonary Disease

## 2010-08-25 MED ORDER — BUDESONIDE-FORMOTEROL FUMARATE 160-4.5 MCG/ACT IN AERO: 2.0000 | INHALATION_SPRAY | Freq: Two times a day (BID) | RESPIRATORY_TRACT | Status: DC

## 2010-08-25 NOTE — Telephone Encounter (Signed)
I spoke with the pt and advised that he has not been seen since 2010 so we cannot refill a med unless he makes an appt. Pt states he doe snot need an appt. I again advised of the above. He set an appt and I sent rx. i advised the pt that he must keep appt to get further refills. He states understanding. Culbertson Bing, CMA

## 2010-08-25 NOTE — Telephone Encounter (Signed)
LMTCBx1. Pt last seen 2010 with no pending appt. Burkburnett Bing, CMA

## 2010-08-25 NOTE — Telephone Encounter (Signed)
Pt called back- he asks for 90 day supply. Me

## 2010-08-25 NOTE — Telephone Encounter (Signed)
PATIENT RETURNED CALL.  I INFORMED HIM THAT HE NEEDED TO MAKE APPOINTMENT, BUT HE WANTS SYMBICORT WITHOUT MAKING ONE.  HE WANTS JENNIFER TO CALL HIM BACK.

## 2010-09-13 ENCOUNTER — Encounter: Payer: Self-pay | Admitting: Pulmonary Disease

## 2010-09-14 ENCOUNTER — Encounter: Payer: Self-pay | Admitting: Pulmonary Disease

## 2010-09-14 ENCOUNTER — Ambulatory Visit (INDEPENDENT_AMBULATORY_CARE_PROVIDER_SITE_OTHER): Payer: Medicare Other | Admitting: Pulmonary Disease

## 2010-09-14 VITALS — BP 132/72 | HR 86 | Temp 97.5°F | Ht 72.0 in | Wt 227.8 lb

## 2010-09-14 DIAGNOSIS — J45909 Unspecified asthma, uncomplicated: Secondary | ICD-10-CM

## 2010-09-14 MED ORDER — LEVALBUTEROL TARTRATE 45 MCG/ACT IN AERO: 2.0000 | INHALATION_SPRAY | RESPIRATORY_TRACT | Status: DC | PRN

## 2010-09-14 NOTE — Patient Instructions (Signed)
No change in medications for your asthma.  followup with me as needed.

## 2010-09-14 NOTE — Progress Notes (Signed)
  Subjective:    Patient ID: [name], male    DOB: 05-29-1922, 77 y.o.   MRN: [id number]  HPI The pt comes in today for pulmonary f/u for his known asthma.  He has not been seen in awhile, but has been doing very well.  He rarely uses his rescue inhaler, and has not had an acute exacerbation since the last visit here.  He denies cough or congestion.   Review of Systems  Constitutional: Negative for fever and unexpected weight change.  HENT: Positive for congestion, rhinorrhea and postnasal drip. Negative for ear pain, nosebleeds, sore throat, sneezing, trouble swallowing, dental problem and sinus pressure.   Eyes: Negative for redness and itching.  Respiratory: Negative for cough, chest tightness, shortness of breath and wheezing.   Cardiovascular: Negative for palpitations and leg swelling.  Gastrointestinal: Negative for nausea and vomiting.  Genitourinary: Negative for dysuria.  Musculoskeletal: Negative for joint swelling.  Skin: Negative for rash.  Neurological: Negative for headaches.  Hematological: Does not bruise/bleed easily.  Psychiatric/Behavioral: Negative for dysphoric mood. The patient is not nervous/anxious.        Objective:   Physical Exam Ow male in nad Chest with clear bs throughout, no wheezing, no rhonchi Cor with rrr LE without signficant edema, no cyanosis Alert, oriented, moves all 4        Assessment & Plan:

## 2010-09-17 NOTE — Assessment & Plan Note (Signed)
The pt is doing well on symbicort, with no acute exacerbations or excessive rescue inhaler use.  I have asked him to stay on his meds, and have encouraged him to work on weight loss and conditioning.

## 2010-11-23 LAB — POCT I-STAT, CHEM 8
BUN: 34 — ABNORMAL HIGH
Calcium, Ion: 1.19
Chloride: 97
Creatinine, Ser: 1.7 — ABNORMAL HIGH
Glucose, Bld: 109 — ABNORMAL HIGH
HCT: 43
Hemoglobin: 14.6
Potassium: 3.8
Sodium: 134 — ABNORMAL LOW
TCO2: 30

## 2010-11-23 LAB — DIFFERENTIAL
Eosinophils Absolute: 0.2
Lymphocytes Relative: 39
Lymphs Abs: 3.5
Neutro Abs: 4.3
Neutrophils Relative %: 48

## 2010-11-23 LAB — POCT CARDIAC MARKERS
CKMB, poc: 1.2
CKMB, poc: 1.6
Myoglobin, poc: 106
Myoglobin, poc: 122
Operator id: 277751
Troponin i, poc: <0.05

## 2010-11-23 LAB — CBC
Platelets: 221
RBC: 4.55
WBC: 8.9

## 2010-11-25 LAB — PROTIME-INR
INR: 1.1
Prothrombin Time: 14

## 2010-11-25 LAB — APTT: aPTT: 33

## 2010-11-28 LAB — BASIC METABOLIC PANEL
CO2: 27
Calcium: 8.9
GFR calc Af Amer: >60
Potassium: 4.3
Sodium: 128 — ABNORMAL LOW

## 2010-11-28 LAB — URINALYSIS, ROUTINE W REFLEX MICROSCOPIC
Glucose, UA: NEGATIVE
Hgb urine dipstick: NEGATIVE
Protein, ur: NEGATIVE
Specific Gravity, Urine: 1.009

## 2010-11-28 LAB — PROTIME-INR
INR: 4.2 — ABNORMAL HIGH
Prothrombin Time: 44.7 — ABNORMAL HIGH

## 2010-11-28 LAB — POCT CARDIAC MARKERS
CKMB, poc: <1 — ABNORMAL LOW
Myoglobin, poc: 76.9
Troponin i, poc: <0.05

## 2010-11-28 LAB — POCT I-STAT, CHEM 8
BUN: 30 — ABNORMAL HIGH
Chloride: 94 — ABNORMAL LOW
Creatinine, Ser: 1.5
Potassium: 4
Sodium: 128 — ABNORMAL LOW

## 2010-11-28 LAB — CARDIAC PANEL(CRET KIN+CKTOT+MB+TROPI)
Relative Index: INVALID
Relative Index: INVALID
Total CK: 21

## 2010-11-28 LAB — DIFFERENTIAL
Eosinophils Relative: 1
Lymphocytes Relative: 11 — ABNORMAL LOW
Monocytes Absolute: 1.3 — ABNORMAL HIGH
Monocytes Relative: 8
Neutro Abs: 12.3 — ABNORMAL HIGH

## 2010-11-28 LAB — CK TOTAL AND CKMB (NOT AT ARMC)
CK, MB: 1.7
Total CK: 31

## 2010-11-28 LAB — COMPREHENSIVE METABOLIC PANEL
ALT: 22
AST: 17
Albumin: 3.4 — ABNORMAL LOW
Calcium: 8.7
GFR calc Af Amer: >60
Glucose, Bld: 86
Sodium: 127 — ABNORMAL LOW
Total Protein: 6.2

## 2010-11-28 LAB — APTT: aPTT: 58 — ABNORMAL HIGH

## 2010-11-28 LAB — D-DIMER, QUANTITATIVE: D-Dimer, Quant: 0.3

## 2010-11-28 LAB — CBC
HCT: 39.2
Hemoglobin: 13.2
MCHC: 33.8
RBC: 4.3

## 2010-12-28 ENCOUNTER — Telehealth: Payer: Self-pay | Admitting: Pulmonary Disease

## 2010-12-28 MED ORDER — BUDESONIDE-FORMOTEROL FUMARATE 160-4.5 MCG/ACT IN AERO: 2.0000 | INHALATION_SPRAY | Freq: Two times a day (BID) | RESPIRATORY_TRACT | Status: DC

## 2010-12-28 NOTE — Telephone Encounter (Signed)
Rx has been sent to requested pharmacy and patient is aware. Also patient understands that he needs to follow up with [facility] as planned and no longer than 3 years as he will become a new patient again.

## 2011-01-17 ENCOUNTER — Encounter (HOSPITAL_COMMUNITY): Payer: Self-pay | Admitting: Emergency Medicine

## 2011-01-17 ENCOUNTER — Emergency Department (HOSPITAL_COMMUNITY)
Admission: EM | Admit: 2011-01-17 | Discharge: 2011-01-18 | Disposition: A | Discharge status: Home / Self Care | Payer: Medicare Other | Attending: Emergency Medicine | Admitting: Emergency Medicine

## 2011-01-17 ENCOUNTER — Emergency Department (HOSPITAL_COMMUNITY): Payer: Medicare Other

## 2011-01-17 DIAGNOSIS — Z79899 Other long term (current) drug therapy: Secondary | ICD-10-CM | POA: Insufficient documentation

## 2011-01-17 DIAGNOSIS — R3919 Other difficulties with micturition: Secondary | ICD-10-CM | POA: Insufficient documentation

## 2011-01-17 DIAGNOSIS — R1031 Right lower quadrant pain: Secondary | ICD-10-CM | POA: Insufficient documentation

## 2011-01-17 DIAGNOSIS — N452 Orchitis: Secondary | ICD-10-CM | POA: Insufficient documentation

## 2011-01-17 DIAGNOSIS — K219 Gastro-esophageal reflux disease without esophagitis: Secondary | ICD-10-CM | POA: Insufficient documentation

## 2011-01-17 DIAGNOSIS — N453 Epididymo-orchitis: Secondary | ICD-10-CM

## 2011-01-17 DIAGNOSIS — N509 Disorder of male genital organs, unspecified: Secondary | ICD-10-CM | POA: Insufficient documentation

## 2011-01-17 DIAGNOSIS — R10819 Abdominal tenderness, unspecified site: Secondary | ICD-10-CM | POA: Insufficient documentation

## 2011-01-17 DIAGNOSIS — I10 Essential (primary) hypertension: Secondary | ICD-10-CM | POA: Insufficient documentation

## 2011-01-17 DIAGNOSIS — N5089 Other specified disorders of the male genital organs: Secondary | ICD-10-CM | POA: Insufficient documentation

## 2011-01-17 DIAGNOSIS — R110 Nausea: Secondary | ICD-10-CM | POA: Insufficient documentation

## 2011-01-17 DIAGNOSIS — Z950 Presence of cardiac pacemaker: Secondary | ICD-10-CM | POA: Insufficient documentation

## 2011-01-17 LAB — BASIC METABOLIC PANEL
BUN: 21 mg/dL (ref 6–23)
GFR calc Af Amer: 67 mL/min — ABNORMAL LOW (ref >90)
GFR calc non Af Amer: 57 mL/min — ABNORMAL LOW (ref >90)
Potassium: 3.9 mEq/L (ref 3.5–5.1)
Sodium: 129 mEq/L — ABNORMAL LOW (ref 135–145)

## 2011-01-17 LAB — DIFFERENTIAL
Basophils Relative: 0 % (ref 0–1)
Eosinophils Absolute: 0 10*3/uL (ref 0.0–0.7)
Neutrophils Relative %: 90 % — ABNORMAL HIGH (ref 43–77)

## 2011-01-17 LAB — CBC
MCH: 31.5 pg (ref 26.0–34.0)
MCHC: 35.2 g/dL (ref 30.0–36.0)
Platelets: 210 10*3/uL (ref 150–400)
RDW: 13.3 % (ref 11.5–15.5)

## 2011-01-17 MED ORDER — ONDANSETRON HCL 4 MG/2ML IJ SOLN
4.0000 mg | Freq: Once | INTRAMUSCULAR | Status: AC
  Administered 2011-01-17: 4 mg via INTRAVENOUS
  Filled 2011-01-17: qty 2

## 2011-01-17 NOTE — ED Notes (Signed)
PT. REPORTS RLQ PAIN WITH NAUSEA , TOOK LAXATIVE FOR CONSTIPATION LAST NIGHT WITH RELIEF.  NO FEVER OR CHILLS.

## 2011-01-17 NOTE — ED Notes (Signed)
Pt states that he has been having severe right lower abdominal pain. Pt states that he still has his gallbladder and appendix. Pt states that he took a laxative and that helped his pain slightly by going to the bathroom. Pt states that the pain does not move. Pt alert and oriented and able to follow commands

## 2011-01-17 NOTE — ED Notes (Signed)
Pt actively vomiting in waiting area. His wife is crying. Pt and wife calmed and reassured. Expediting room assignment.

## 2011-01-18 LAB — URINALYSIS, ROUTINE W REFLEX MICROSCOPIC
Bilirubin Urine: NEGATIVE
Nitrite: NEGATIVE
Protein, ur: 30 mg/dL — AB
Specific Gravity, Urine: 1.018 (ref 1.005–1.030)
Urobilinogen, UA: 0.2 mg/dL (ref 0.0–1.0)

## 2011-01-18 LAB — URINE MICROSCOPIC-ADD ON

## 2011-01-18 MED ORDER — ONDANSETRON 8 MG PO TBDP: 8.0000 mg | ORAL_TABLET | Freq: Three times a day (TID) | ORAL | Status: AC | PRN

## 2011-01-18 MED ORDER — CIPROFLOXACIN HCL 500 MG PO TABS
500.0000 mg | ORAL_TABLET | Freq: Once | ORAL | Status: AC
  Administered 2011-01-18: 500 mg via ORAL
  Filled 2011-01-18: qty 1

## 2011-01-18 MED ORDER — ONDANSETRON 4 MG PO TBDP
8.0000 mg | ORAL_TABLET | Freq: Once | ORAL | Status: AC
  Administered 2011-01-18: 8 mg via ORAL
  Filled 2011-01-18: qty 2

## 2011-01-18 MED ORDER — CIPROFLOXACIN HCL 500 MG PO TABS: 500.0000 mg | ORAL_TABLET | Freq: Two times a day (BID) | ORAL | Status: AC

## 2011-01-18 NOTE — ED Provider Notes (Signed)
History     CSN: [id number] Arrival date & time: 01/17/2011  9:06 PM   First MD Initiated Contact with Patient 01/17/11 2246      Chief Complaint  Patient presents with  . Abdominal Pain    (Consider location/radiation/quality/duration/timing/severity/associated sxs/prior treatment) HPI Comments: Patient with onset of right lower quadrant abdominal and pelvic pain beginning this morning. Patient also notes tenderness and swelling of right testicle. Pain is worsened with any sort of movement. The patient took a laxative without relief of symptoms. He denies fever and vomiting however states he's feeling very nauseous. Denies diarrhea or constipation, dysuria, blood in urine or blood in stool. Patient with remote history of bilateral inguinal hernia repair.  Patient is a 77 y.o. male presenting with abdominal pain. The history is provided by the patient and the spouse.  Abdominal Pain The primary symptoms of the illness include abdominal pain and nausea. The primary symptoms of the illness do not include fever, shortness of breath, vomiting, diarrhea or dysuria. The current episode started 13 to 24 hours ago. The problem has not changed since onset. Symptoms associated with the illness do not include chills, constipation, frequency or back pain.    Past Medical History  Diagnosis Date  . Hypertension   . Pacemaker   . GERD (gastroesophageal reflux disease)   . Atrial arrhythmia     s/p pacemaker- on coumadin    History reviewed. No pertinent past surgical history.  Family History  Problem Relation Age of Onset  . Heart disease Mother   . Rheum arthritis Mother     History  Substance Use Topics  . Smoking status: Former Smoker -- 1.0 packs/day for 2 years    Types: Cigarettes  . Smokeless tobacco: Not on file  . Alcohol Use: No      Review of Systems  Constitutional: Negative for fever and chills.  HENT: Negative for sore throat and rhinorrhea.   Eyes: Negative for  redness.  Respiratory: Negative for shortness of breath.   Cardiovascular: Negative for chest pain.  Gastrointestinal: Positive for nausea and abdominal pain. Negative for vomiting, diarrhea, constipation and blood in stool.  Genitourinary: Positive for difficulty urinating and testicular pain. Negative for dysuria, frequency, flank pain, penile swelling and penile pain.  Musculoskeletal: Negative for myalgias and back pain.  Skin: Negative for rash.  Neurological: Negative for dizziness and headaches.  Hematological: Negative for adenopathy.    Allergies  Sulfonamide derivatives  Home Medications   Current Outpatient Rx  Name Route Sig Dispense Refill  . ALENDRONATE SODIUM 70 MG PO TABS Oral Take 1 tablet by mouth every 7 (seven) days.     . BUDESONIDE-FORMOTEROL FUMARATE 160-4.5 MCG/ACT IN AERO Inhalation Inhale 2 puffs into the lungs 2 (two) times daily. 1 Inhaler 5  . VITAMIN D3 1000 UNITS PO CAPS Oral Take 1 capsule by mouth daily.     . STOOL SOFTENER PO Oral Take 1 tablet by mouth daily.     [name] FINASTERIDE 5 MG PO TABS Oral Take 5 mg by mouth daily.     [name] HYDROCODONE-ACETAMINOPHEN 5-325 MG PO TABS Oral Take 1 tablet by mouth every 6 (six) hours as needed. For pain.    [name] LEVALBUTEROL TARTRATE 45 MCG/ACT IN AERO Inhalation Inhale 2 puffs into the lungs every 4 (four) hours as needed. Shortness of breath     . LEVOTHYROXINE SODIUM 50 MCG PO TABS Oral Take 50 mcg by mouth daily.      [name] LOSARTAN POTASSIUM  100 MG PO TABS Oral Take 1 tablet by mouth daily.    . MOMETASONE FUROATE 50 MCG/ACT NA SUSP Nasal Place 2 sprays into the nose daily.      . MULTIVITAMINS PO CAPS Oral Take 1 capsule by mouth daily.      [name] PRESERVISION AREDS PO Oral Take 1 tablet by mouth daily.     [name] OMEPRAZOLE MAGNESIUM 20 MG PO TBEC Oral Take 20 mg by mouth daily.      . TRAMADOL HCL 50 MG PO TABS Oral Take 1 tablet by mouth as needed. pain    . TRIAMTERENE-HCTZ 37.5-25 MG PO TABS  Take 1/2 tab once daily    .  VALSARTAN 160 MG PO TABS Oral Take 160 mg by mouth daily.        BP 158/60  Pulse 67  Temp(Src) 98.4 F (36.9 C) (Oral)  Resp 21  SpO2 94%  Physical Exam  Nursing note and vitals reviewed. Constitutional: He is oriented to person, place, and time. He appears well-developed and well-nourished.  HENT:  Head: Normocephalic and atraumatic.  Eyes: Conjunctivae are normal. Pupils are equal, round, and reactive to light. Right eye exhibits no discharge. Left eye exhibits no discharge.  Neck: Normal range of motion. Neck supple.  Cardiovascular: Normal rate, regular rhythm and normal heart sounds.   Pulmonary/Chest: Effort normal and breath sounds normal.  Abdominal: Soft. Bowel sounds are normal. There is tenderness. There is no rebound and no guarding.  Genitourinary: Penis normal. Right testis shows swelling and tenderness. Right testis shows no mass. Left testis shows no mass, no swelling and no tenderness.       Right testicle is swollen and very tender to palpation.  Musculoskeletal: He exhibits no edema.  Neurological: He is alert and oriented to person, place, and time.  Skin: Skin is warm and dry. There is erythema.  Psychiatric: He has a normal mood and affect.    ED Course  Procedures (including critical care time)  Labs Reviewed  CBC - Abnormal; Notable for the following:    WBC 24.0 (*)    All other components within normal limits  DIFFERENTIAL - Abnormal; Notable for the following:    Neutrophils Relative 90 (*)    Neutro Abs 21.6 (*)    Lymphocytes Relative 5 (*)    Monocytes Absolute 1.1 (*)    All other components within normal limits  BASIC METABOLIC PANEL - Abnormal; Notable for the following:    Sodium 129 (*)    Chloride 91 (*)    Glucose, Bld 185 (*)    GFR calc non Af Amer 57 (*)    GFR calc Af Amer 67 (*)    All other components within normal limits  URINALYSIS, ROUTINE W REFLEX MICROSCOPIC   US Scrotum  01/18/2011  *RADIOLOGY REPORT*  Clinical  Data: Right testicular swelling.  ULTRASOUND OF SCROTUM  Technique:  Complete ultrasound examination of the testicles, epididymis, and other scrotal structures was performed.  Comparison:  None.  Findings:  Right testis:  Enlarged and heterogeneous in echogenicity, measuring 4.7 x 3.0 x 3.4 cm.  There is increased color Doppler flow.  Arterial and venous wave forms documented.  Left testis:  Small/atrophic in appearance. Mildly heterogeneous echogenicity without definite focal lesion identified.  Measures 2.7 x 1.5 x 1.7 cm.  Color Doppler flow with arterial and venous wave forms documented.  Right epididymis:  Enlarged with increased vascularity.  Left epididymis:  Within normal limits.  A 4  mm epididymal cyst noted.  Hydrocele:  Moderate right hydrocele.  Varicocele:  Within normal limits.  IMPRESSION: Enlarged hypervascular right testicle.  The differential for this sonographic appearance in an elderly patient includes orchitis and lymphoma.  Per the sonographer's history, the onset is acute and painful, which favors orchitis. The epididymis is also enlarged, favoring epididymoorchitis. In the absence of pain, lymphoma would have to be excluded.  The left testicle is atrophic.  This may be congenital or secondary to remote insult.  Color Doppler flow with arterial and venous wave forms documented bilaterally.  Original Report Authenticated By: Suanne Marker, M.D.   [place] Art/ven Flow Abd Pelv Doppler  01/18/2011  *RADIOLOGY REPORT*  Clinical Data: Right testicular swelling.  ULTRASOUND OF SCROTUM  Technique:  Complete ultrasound examination of the testicles, epididymis, and other scrotal structures was performed.  Comparison:  None.  Findings:  Right testis:  Enlarged and heterogeneous in echogenicity, measuring 4.7 x 3.0 x 3.4 cm.  There is increased color Doppler flow.  Arterial and venous wave forms documented.  Left testis:  Small/atrophic in appearance. Mildly heterogeneous echogenicity without  definite focal lesion identified.  Measures 2.7 x 1.5 x 1.7 cm.  Color Doppler flow with arterial and venous wave forms documented.  Right epididymis:  Enlarged with increased vascularity.  Left epididymis:  Within normal limits.  A 4 mm epididymal cyst noted.  Hydrocele:  Moderate right hydrocele.  Varicocele:  Within normal limits.  IMPRESSION: Enlarged hypervascular right testicle.  The differential for this sonographic appearance in an elderly patient includes orchitis and lymphoma.  Per the sonographer's history, the onset is acute and painful, which favors orchitis. The epididymis is also enlarged, favoring epididymoorchitis. In the absence of pain, lymphoma would have to be excluded.  The left testicle is atrophic.  This may be congenital or secondary to remote insult.  Color Doppler flow with arterial and venous wave forms documented bilaterally.  Original Report Authenticated By: Suanne Marker, M.D.     1. Epididymo-orchitis, acute     Pt seen and examined. D/w Dr. Maryan Rued. U/S ordered as testicle seems to be the focus of the patient's pain. Nausea medication ordered.  12:46 AM Pt is in U/S.   1:50 AM US shows epididymo-orchitis. Will treat with cipro. Pt notified to have his INR closely monitored. Dr. Maryan Rued has seen. Patient agrees to close followup with his primary care doctor and urologist. Patient urged to return with worsening pain, persistent fever, worsening abdominal pain, persistent vomiting or if he has any other concerns. The patient and family verbalized understanding and agree with the plan. First his antibiotics given in the emergency department.   MDM  Patient with epididymoorchitis. The increased white blood cell count is likely due to stress as patient was persistently retching prior to arrival. Patient does not have significant tenderness in the right lower quadrant. Doubt appendicitis. Patient does not have a fever. He appears well and is stable for discharge home.  Patient has close PCP and urology followup. Patient has chronic hyponatremia that is at baseline.       Faustino Congress, [place] 01/18/11 [phone]

## 2011-01-18 NOTE — ED Provider Notes (Signed)
Medical screening examination/treatment/procedure(s) were conducted as a shared visit with non-physician practitioner(s) and myself.  I personally evaluated the patient during the encounter  Pt with testicular pain but no significant RLQ pain on exam.  U/S with epidiymo-orchitis.  Tolerating po's and well appearing here.  Will treat with cipro and have f/u with his PCP to keep a check on his INR and urology for above problem.  Blanchie Dessert, MD 01/18/11 [phone]

## 2011-05-03 ENCOUNTER — Encounter: Payer: Self-pay | Admitting: Internal Medicine

## 2011-05-16 ENCOUNTER — Other Ambulatory Visit: Payer: Self-pay | Admitting: Dermatology

## 2011-06-06 ENCOUNTER — Encounter: Payer: Self-pay | Admitting: Internal Medicine

## 2011-07-11 ENCOUNTER — Telehealth: Payer: Self-pay | Admitting: Internal Medicine

## 2011-07-11 NOTE — Telephone Encounter (Signed)
New msg Pt called and wants to talk to you about symptoms he has been having and possibly moving June appt up sooner.

## 2011-07-11 NOTE — Telephone Encounter (Signed)
I spoke with the patient. He states he is very tired and not feeling well since his device hit ERI. I explained to the patient I would review with Dr. Caryl Comes when we can get him worked in sooner. I will call him back today.

## 2011-07-11 NOTE — Telephone Encounter (Signed)
Per Dr. Caryl Comes- ok for 07/18/11 at 12:15 pm. I have left a message with the appointment date and time on his identified voice mail. I have asked that he call back if this is a problem.

## 2011-07-18 ENCOUNTER — Encounter: Payer: Self-pay | Admitting: Internal Medicine

## 2011-07-18 ENCOUNTER — Encounter: Payer: Self-pay | Admitting: *Deleted

## 2011-07-18 ENCOUNTER — Ambulatory Visit (INDEPENDENT_AMBULATORY_CARE_PROVIDER_SITE_OTHER): Payer: Medicare Other | Admitting: Internal Medicine

## 2011-07-18 VITALS — BP 152/64 | HR 65 | Ht 70.0 in | Wt 225.0 lb

## 2011-07-18 DIAGNOSIS — Z950 Presence of cardiac pacemaker: Secondary | ICD-10-CM

## 2011-07-18 DIAGNOSIS — I442 Atrioventricular block, complete: Secondary | ICD-10-CM | POA: Insufficient documentation

## 2011-07-18 DIAGNOSIS — I10 Essential (primary) hypertension: Secondary | ICD-10-CM

## 2011-07-18 DIAGNOSIS — I4891 Unspecified atrial fibrillation: Secondary | ICD-10-CM

## 2011-07-18 LAB — CBC WITH DIFFERENTIAL/PLATELET
Basophils Relative: 0.8 % (ref 0.0–3.0)
Eosinophils Absolute: 0.1 10*3/uL (ref 0.0–0.7)
HCT: 38.5 % — ABNORMAL LOW (ref 39.0–52.0)
Hemoglobin: 12.8 g/dL — ABNORMAL LOW (ref 13.0–17.0)
Lymphocytes Relative: 23.5 % (ref 12.0–46.0)
Lymphs Abs: 1.7 10*3/uL (ref 0.7–4.0)
MCHC: 33.2 g/dL (ref 30.0–36.0)
Neutro Abs: 4.9 10*3/uL (ref 1.4–7.7)
RBC: 4.07 Mil/uL — ABNORMAL LOW (ref 4.22–5.81)
RDW: 14.8 % — ABNORMAL HIGH (ref 11.5–14.6)

## 2011-07-18 LAB — PROTIME-INR
INR: 2.5 ratio — ABNORMAL HIGH (ref 0.8–1.0)
Prothrombin Time: 27.5 s — ABNORMAL HIGH (ref 10.2–12.4)

## 2011-07-18 LAB — BASIC METABOLIC PANEL
CO2: 29 mEq/L (ref 19–32)
Calcium: 9.1 mg/dL (ref 8.4–10.5)
Chloride: 99 mEq/L (ref 96–112)
Glucose, Bld: 109 mg/dL — ABNORMAL HIGH (ref 70–99)
Potassium: 4.1 mEq/L (ref 3.5–5.1)
Sodium: 134 mEq/L — ABNORMAL LOW (ref 135–145)

## 2011-07-18 MED ORDER — SODIUM CHLORIDE 0.9 % IR SOLN
80.0000 mg | Status: AC
  Filled 2011-07-18: qty 2

## 2011-07-18 MED ORDER — CEFAZOLIN SODIUM-DEXTROSE 2-3 GM-% IV SOLR
2.0000 g | INTRAVENOUS | Status: AC
  Filled 2011-07-18: qty 50

## 2011-07-18 NOTE — Assessment & Plan Note (Signed)
I presume he is device dependent. He is 100% ventricularly paced

## 2011-07-18 NOTE — Assessment & Plan Note (Signed)
Paroxysmal on flecainide and warfarin.

## 2011-07-18 NOTE — Progress Notes (Signed)
History and Physical  Patient ID: [name] MRN: [id number], SOB: November 13, 1922 78 y.o. Date of Encounter: 07/18/2011, 1:11 PM  Primary Physician: Janalyn Rouse, MD, MD Primary Cardiologist: Dorthula Perfect Electrophysiologist:  sk  Chief Complaint: need pacer replacement   History of Present Illness: [name] is a 78 y.o. male  is referred by Dr. Francella Solian in her device generator replacement.  He underwent urgent pacemaker implantation in 2005 for high-grade heart block while in [place]. This occurred in the setting of paroxysmal atrial fibrillation for which he takes flecainide and Coumadin.  About 3-4 weeks ago he noted a significant change in his exercise tolerance. This is coincidental with his reversion to VVI mode.  He denies chest pain. He has no peripheral edema. Exercise is limited primarily by his back.  Past Medical History  Diagnosis Date  . Hypertension   . Pacemaker-[employer]tr[employer]   . GERD (gastroesophageal reflux disease)   . Atrial fibrillation      on coumadin    . Atrioventricular block, complete      No past surgical history on file.    Current Outpatient Prescriptions  Medication Sig Dispense Refill  . alendronate (FOSAMAX) 70 MG tablet Take 1 tablet by mouth every 7 (seven) days.       [name] allopurinol (ZYLOPRIM) 300 MG tablet Take 300 mg by mouth daily.       . budesonide-formoterol (SYMBICORT) 160-4.5 MCG/ACT inhaler Inhale 2 puffs into the lungs 2 (two) times daily.  1 Inhaler  5  . Cholecalciferol (VITAMIN D3) 1000 UNITS CAPS Take 1 capsule by mouth daily.       . colchicine 0.6 MG tablet Take 0.6 mg by mouth daily as needed.      Mariane Baumgarten Calcium (STOOL SOFTENER PO) Take 1 tablet by mouth daily.       . finasteride (PROSCAR) 5 MG tablet Take 5 mg by mouth daily.       . fish oil-omega-3 fatty acids 1000 MG capsule Take 1 g by mouth daily.      . flecainide (TAMBOCOR) 100 MG tablet Take 50 mg by mouth 2 (two) times daily.       . flurazepam  (DALMANE) 30 MG capsule Take 30 mg by mouth at bedtime as needed.      . hydrocodone-acetaminophen (LORCET-HD) 5-500 MG per capsule Take 1 capsule by mouth as needed.      . levalbuterol (XOPENEX HFA) 45 MCG/ACT inhaler Inhale 2 puffs into the lungs every 4 (four) hours as needed. Shortness of breath       . levothyroxine (SYNTHROID, LEVOTHROID) 50 MCG tablet Take 50 mcg by mouth daily.        [name] losartan (COZAAR) 100 MG tablet Take 1 tablet by mouth daily.      [name] lubiprostone (AMITIZA) 8 MCG capsule Take 8 mcg by mouth as needed.      . methylcellulose oral powder Take 1 packet by mouth daily.      . mometasone (NASONEX) 50 MCG/ACT nasal spray Place 2 sprays into the nose daily.        . Multiple Vitamin (MULTIVITAMIN) capsule Take 1 capsule by mouth daily.        . Multiple Vitamins-Minerals (PRESERVISION AREDS PO) Take 1 tablet by mouth daily.       [name] omeprazole (PRILOSEC OTC) 20 MG tablet Take 20 mg by mouth daily.        . silodosin (RAPAFLO) 8 MG CAPS capsule Take 8 mg  by mouth every 3 (three) days as needed.      . traMADol (ULTRAM) 50 MG tablet Take 1 tablet by mouth as needed. pain      . triamterene-hydrochlorothiazide (MAXZIDE-25) 37.5-25 MG per tablet Take 1/2 tab once daily      . warfarin (COUMADIN) 3 MG tablet Take by mouth. As directed by the COUMADIN clinic         Allergies: Allergies  Allergen Reactions  . Sulfonamide Derivatives      History  Substance Use Topics  . Smoking status: Former Smoker -- 1.0 packs/day for 2 years    Types: Cigarettes    Quit date: 07/17/1941  . Smokeless tobacco: Never Used  . Alcohol Use: No      Family History  Problem Relation Age of Onset  . Heart disease Mother   . Rheum arthritis Mother       ROS:  Please see the history of present illness.     All other systems reviewed and negative.   Vital Signs: Blood pressure 152/64, pulse 65, height 5\' 10"  (1.778 m), weight 225 lb (102.059 kg).  PHYSICAL EXAM: General:  Well  nourished, well developed significantly obese male in no acute distress  HEENT: normal Lymph: no adenopathy Neck: no JVD Endocrine:  No thryomegaly Vascular: No carotid bruits; FA pulses 2+ bilaterally without bruits Cardiac:  normal S1, S2; RRR; no murmur Back: without kyphosis/scoliosis, no CVA tenderness Lungs:  clear to auscultation bilaterally, no wheezing, rhonchi or rales Abd: soft, nontender, no hepatomegaly Ext: no edema Musculoskeletal:  No deformities, BUE and BLE strength normal and equal Skin: warm and dry Neuro:  CNs 2-12 intact, no focal abnormalities noted Psych:  Normal affect    ECG: Sinus Rhythm  @65             Intervals  -/20/49  Axis -70       ASSESSMENT AND PLAN:

## 2011-07-18 NOTE — Patient Instructions (Signed)
Your physician has recommended that you have a pacemaker generator change. Please see the instruction sheet given to you today for more information.

## 2011-07-18 NOTE — Assessment & Plan Note (Signed)
Modestly elevated. I will defer further management to Dr. Francella Solian and Dr. Brigitte Pulse

## 2011-07-18 NOTE — Assessment & Plan Note (Signed)
The patient's pacemaker has reached ERI and has reverted to VVI concurrent with his change in functional status. We have reviewed the potential benefits and risks of device generator replacement primarily related to infection and lead failure He understands these risks and is willing to proceed. The sooner rather than later

## 2011-07-19 ENCOUNTER — Ambulatory Visit (HOSPITAL_COMMUNITY)
Admission: RE | Admit: 2011-07-19 | Discharge: 2011-07-19 | Disposition: A | Discharge status: Home / Self Care | Payer: Medicare Other | Source: Ambulatory Visit | Attending: Internal Medicine | Admitting: Internal Medicine

## 2011-07-19 ENCOUNTER — Encounter (HOSPITAL_COMMUNITY)
Admission: RE | Disposition: A | Discharge status: Home / Self Care | Payer: Self-pay | Source: Ambulatory Visit | Attending: Internal Medicine

## 2011-07-19 DIAGNOSIS — J45909 Unspecified asthma, uncomplicated: Secondary | ICD-10-CM

## 2011-07-19 DIAGNOSIS — Z45018 Encounter for adjustment and management of other part of cardiac pacemaker: Secondary | ICD-10-CM | POA: Insufficient documentation

## 2011-07-19 DIAGNOSIS — Z7901 Long term (current) use of anticoagulants: Secondary | ICD-10-CM | POA: Insufficient documentation

## 2011-07-19 DIAGNOSIS — Z950 Presence of cardiac pacemaker: Secondary | ICD-10-CM

## 2011-07-19 DIAGNOSIS — I10 Essential (primary) hypertension: Secondary | ICD-10-CM

## 2011-07-19 DIAGNOSIS — K219 Gastro-esophageal reflux disease without esophagitis: Secondary | ICD-10-CM

## 2011-07-19 DIAGNOSIS — J329 Chronic sinusitis, unspecified: Secondary | ICD-10-CM

## 2011-07-19 DIAGNOSIS — I4891 Unspecified atrial fibrillation: Secondary | ICD-10-CM

## 2011-07-19 DIAGNOSIS — I442 Atrioventricular block, complete: Secondary | ICD-10-CM

## 2011-07-19 HISTORY — PX: PACEMAKER GENERATOR CHANGE: SHX5481

## 2011-07-19 LAB — SURGICAL PCR SCREEN
MRSA, PCR: NEGATIVE
Staphylococcus aureus: NEGATIVE

## 2011-07-19 SURGERY — PACEMAKER GENERATOR CHANGE
Anesthesia: LOCAL

## 2011-07-19 MED ORDER — SODIUM CHLORIDE 0.9 % IV SOLN: 250.0000 mL | INTRAVENOUS | Status: DC

## 2011-07-19 MED ORDER — SODIUM CHLORIDE 0.9 % IJ SOLN: 3.0000 mL | Freq: Two times a day (BID) | INTRAMUSCULAR | Status: DC

## 2011-07-19 MED ORDER — FENTANYL CITRATE 0.05 MG/ML IJ SOLN
INTRAMUSCULAR | Status: AC
  Filled 2011-07-19: qty 2

## 2011-07-19 MED ORDER — MUPIROCIN 2 % EX OINT
TOPICAL_OINTMENT | CUTANEOUS | Status: AC
  Administered 2011-07-19: 1 via NASAL
  Filled 2011-07-19: qty 22

## 2011-07-19 MED ORDER — MIDAZOLAM HCL 5 MG/5ML IJ SOLN
INTRAMUSCULAR | Status: AC
  Filled 2011-07-19: qty 5

## 2011-07-19 MED ORDER — ONDANSETRON HCL 4 MG/2ML IJ SOLN: 4.0000 mg | Freq: Four times a day (QID) | INTRAMUSCULAR | Status: DC | PRN

## 2011-07-19 MED ORDER — CHLORHEXIDINE GLUCONATE 4 % EX LIQD
60.0000 mL | Freq: Once | CUTANEOUS | Status: DC
  Filled 2011-07-19: qty 60

## 2011-07-19 MED ORDER — SODIUM CHLORIDE 0.45 % IV SOLN
INTRAVENOUS | Status: DC
  Administered 2011-07-19: 12:00:00 via INTRAVENOUS

## 2011-07-19 MED ORDER — LABETALOL HCL 5 MG/ML IV SOLN
INTRAVENOUS | Status: AC
  Filled 2011-07-19: qty 4

## 2011-07-19 MED ORDER — SODIUM CHLORIDE 0.9 % IJ SOLN: 3.0000 mL | INTRAMUSCULAR | Status: DC | PRN

## 2011-07-19 MED ORDER — ACETAMINOPHEN 325 MG PO TABS
325.0000 mg | ORAL_TABLET | ORAL | Status: DC | PRN
  Filled 2011-07-19: qty 2

## 2011-07-19 MED ORDER — MUPIROCIN 2 % EX OINT
TOPICAL_OINTMENT | Freq: Once | CUTANEOUS | Status: AC
  Administered 2011-07-19: 1 via NASAL

## 2011-07-19 MED ORDER — LABETALOL HCL 5 MG/ML IV SOLN
10.0000 mg | Freq: Once | INTRAVENOUS | Status: AC
  Administered 2011-07-19: 10 mg via INTRAVENOUS

## 2011-07-19 NOTE — Interval H&P Note (Signed)
History and Physical Interval Note:  07/19/2011 12:13 PM  [name]  has presented today for surgery, with the diagnosis of ERI  The various methods of treatment have been discussed with the patient and family. After consideration of risks, benefits and other options for treatment, the patient has consented to  Procedure(s) (LRB): PACEMAKER GENERATOR CHANGE (N/A) as a surgical intervention .  The patients' history has been reviewed, patient examined, no change in status, stable for surgery.  I have reviewed the patients' chart and labs.  Questions were answered to the patient's satisfaction.     Virl Axe

## 2011-07-19 NOTE — Discharge Instructions (Signed)
Pacemaker Battery Change A pacemaker battery usually lasts 4 to 12 years. Once or twice per year, you will be asked to visit your caregiver to have a full evaluation of your pacemaker. When a battery needs to be replaced, the entire pacemaker is actually replaced so that you can benefit from new circuitry and any new features that have recently been added to pacemakers. Most often, this procedure is very simple because the leads are already in place. After giving medicine to numb the skin, your health care provider makes a cut to reopen the pocket holding the pacemaker and disconnects the old device from its leads. The leads are routinely tested at this time. If they are working okay, the new pacemaker may simply be connected to the existing leads. If there is any problem with the old lead system, it may be wise to replace the lead system while inserting the new pacemaker. There are many things that affect how long a pacemaker battery will last:  Age of the pacemaker.   Number of leads (1, 2 or 3).   Pacemaker work load. If the pacemaker is helping the heart more often, then the battery will not last as long as if the pacemaker does not need to help the heart.   Resistance of the leads. The greater the resistance, the greater the drain on the battery. This can increase as the leads get older or if one or more of the leads does not have the best contact with the heart.   Power (voltage) settings.   The health of the person's heart. If the health of the heart gets worse, then the pacemaker may have to work more often and the setting changed to accommodate these changes.  Your health care provider will be alerted to the fact that it is time to replace the battery during follow-up exams. He or she will check your pacemaker using a small table-top computer, called a programmer, and a wand. The wand is about the same size as a remote control. Your provider puts the wand on your body in the area where the  pacemaker is located. Information from the pacemaker is received about how well your heart is working and the status of the battery. It is not painful, and it usually takes just a few minutes. You will have plenty of time before the battery is fully used up to plan for replacement.  LET YOUR CAREGIVER KNOW ABOUT:   Symptoms of chest pain, trouble breathing, palpitations, lightheadedness, or feelings of an abnormal or irregular heart beat.   Allergies.   Medications taken including herbs, eye drops, over the counter medications, and creams   Use of steroids (by mouth or creams).   Possible pregnancy, if applicable.   Previous problems with anesthetics or Novocaine.   History of blood clots (thrombophlebitis).   History of bleeding or blood problems.   Surgery since your last pacemaker placement.   Other health problems.  RISKS AND COMPLICATIONS These are very uncommon but include:  Bleeding.   Bruising of the skin around where the incision was made.   Pain at the site of the incision.   Pulling apart of the skin at the incision site.   Infection.   Allergic reaction to anesthetics or medicines used during the procedure.  Diabetics may have a temporary increase in their blood sugar after any surgical procedure.  BEFORE THE PROCEDURE  Wash all of the skin around the area of the chest where the pacemaker is located.   Try to remove any loose, scaling skin. Unless advised otherwise, avoid using aspirin, ibuprofen, or naproxen for 3-4 days before the procedure. Ask your caregiver for help with any other medication adjustments before the pacemaker is replaced. Unless advised otherwise, do not eat or drink after midnight on the night before the procedure EXCEPT for drinking water and taking your medications as you normally would. AFTER THE PROCEDURE   A heart monitor and the pacemaker programmer will be used to make sure that the new pacemaker is working properly.   You can go home  after the procedure.   Your caregiver will advise you if you need to have any stitches. They will be removed 5-7 days after the procedure.  HOME CARE INSTRUCTIONS   Keep the incision clean and dry.   Unless advised otherwise, you may shower after carefully covering the incision with plastic wrap that is taped to your chest.   For the first week after the replacement, avoid stretching motions that pull at the incision site and avoid heavy exercise with the arm on the same side as the incision.   Only take over-the-counter or prescription medicines for pain, discomfort, or fever as directed by your caregiver.   Your caregiver will tell you when you will need to next test your pacemaker by telephone or when to return to the office for re-exam and/or removal of stitches, if necessary.  SEEK MEDICAL CARE IF:   You have unusual pain at the incision site that is not adequately helped by over-the-counter or prescription medicine.   There is drainage or pus from the incision site.   You develop red streaking that extends above or below the incision site.   You feel brief intermittent palpitations, lightheadedness or any symptoms that you feel might be related to your heart.  SEEK IMMEDIATE MEDICAL CARE IF:   You experience chest pain that is different than the pain at the incision site.   You experience:   Shortness of breath.   Palpitations.   Irregular heart beat.   Lightheadedness that does not go away quickly.   Fainting.   You develop a fever.   You have pain that gets worse even though you are taking pain medicine.  MAKE SURE YOU:   Understand these instructions.   Will watch your condition.   Will get help right away if you are not doing well or get worse.  Document Released: 05/24/2006 Document Revised: 02/02/2011 Document Reviewed: 08/27/2006 [employer] Patient Information 2012 [employer]. 

## 2011-07-19 NOTE — Progress Notes (Signed)
Upon wheeling pt out at d/c, pt noticed two blood spots coming up on dsg.  Dime sized not increasing in size.  Dr. Caryl Comes notified.  Wanted pressure held x86min then reinfoce drg with a pressure drg.  Completed.  No further bleeding.  Pt monitored 20 more minutes as per Dr. Aquilla Hacker instructions then d/c to home.

## 2011-07-19 NOTE — H&P (View-Only) (Signed)
History and Physical  Patient ID: [name] MRN: [id number], SOB: May 16, 1922 78 y.o. Date of Encounter: 07/18/2011, 1:11 PM  Primary Physician: Janalyn Rouse, MD, MD Primary Cardiologist: Dorthula Perfect Electrophysiologist:  sk  Chief Complaint: need pacer replacement   History of Present Illness: [name] is a 78 y.o. male  is referred by Dr. Francella Solian in her device generator replacement.  He underwent urgent pacemaker implantation in 2005 for high-grade heart block while in [place]. This occurred in the setting of paroxysmal atrial fibrillation for which he takes flecainide and Coumadin.  About 3-4 weeks ago he noted a significant change in his exercise tolerance. This is coincidental with his reversion to VVI mode.  He denies chest pain. He has no peripheral edema. Exercise is limited primarily by his back.  Past Medical History  Diagnosis Date  . Hypertension   . Pacemaker-Medtronic   . GERD (gastroesophageal reflux disease)   . Atrial fibrillation      on coumadin    . Atrioventricular block, complete      No past surgical history on file.    Current Outpatient Prescriptions  Medication Sig Dispense Refill  . alendronate (FOSAMAX) 70 MG tablet Take 1 tablet by mouth every 7 (seven) days.       [name] allopurinol (ZYLOPRIM) 300 MG tablet Take 300 mg by mouth daily.       . budesonide-formoterol (SYMBICORT) 160-4.5 MCG/ACT inhaler Inhale 2 puffs into the lungs 2 (two) times daily.  1 Inhaler  5  . Cholecalciferol (VITAMIN D3) 1000 UNITS CAPS Take 1 capsule by mouth daily.       . colchicine 0.6 MG tablet Take 0.6 mg by mouth daily as needed.      Mariane Baumgarten Calcium (STOOL SOFTENER PO) Take 1 tablet by mouth daily.       . finasteride (PROSCAR) 5 MG tablet Take 5 mg by mouth daily.       . fish oil-omega-3 fatty acids 1000 MG capsule Take 1 g by mouth daily.      . flecainide (TAMBOCOR) 100 MG tablet Take 50 mg by mouth 2 (two) times daily.       . flurazepam  (DALMANE) 30 MG capsule Take 30 mg by mouth at bedtime as needed.      . hydrocodone-acetaminophen (LORCET-HD) 5-500 MG per capsule Take 1 capsule by mouth as needed.      . levalbuterol (XOPENEX HFA) 45 MCG/ACT inhaler Inhale 2 puffs into the lungs every 4 (four) hours as needed. Shortness of breath       . levothyroxine (SYNTHROID, LEVOTHROID) 50 MCG tablet Take 50 mcg by mouth daily.        [name] losartan (COZAAR) 100 MG tablet Take 1 tablet by mouth daily.      [name] lubiprostone (AMITIZA) 8 MCG capsule Take 8 mcg by mouth as needed.      . methylcellulose oral powder Take 1 packet by mouth daily.      . mometasone (NASONEX) 50 MCG/ACT nasal spray Place 2 sprays into the nose daily.        . Multiple Vitamin (MULTIVITAMIN) capsule Take 1 capsule by mouth daily.        . Multiple Vitamins-Minerals (PRESERVISION AREDS PO) Take 1 tablet by mouth daily.       [name] omeprazole (PRILOSEC OTC) 20 MG tablet Take 20 mg by mouth daily.        . silodosin (RAPAFLO) 8 MG CAPS capsule Take 8 mg  by mouth every 3 (three) days as needed.      . traMADol (ULTRAM) 50 MG tablet Take 1 tablet by mouth as needed. pain      . triamterene-hydrochlorothiazide (MAXZIDE-25) 37.5-25 MG per tablet Take 1/2 tab once daily      . warfarin (COUMADIN) 3 MG tablet Take by mouth. As directed by the COUMADIN clinic         Allergies: Allergies  Allergen Reactions  . Sulfonamide Derivatives      History  Substance Use Topics  . Smoking status: Former Smoker -- 1.0 packs/day for 2 years    Types: Cigarettes    Quit date: 07/17/1941  . Smokeless tobacco: Never Used  . Alcohol Use: No      Family History  Problem Relation Age of Onset  . Heart disease Mother   . Rheum arthritis Mother       ROS:  Please see the history of present illness.     All other systems reviewed and negative.   Vital Signs: Blood pressure 152/64, pulse 65, height 5\' 10"  (1.778 m), weight 225 lb (102.059 kg).  PHYSICAL EXAM: General:  Well  nourished, well developed significantly obese male in no acute distress  HEENT: normal Lymph: no adenopathy Neck: no JVD Endocrine:  No thryomegaly Vascular: No carotid bruits; FA pulses 2+ bilaterally without bruits Cardiac:  normal S1, S2; RRR; no murmur Back: without kyphosis/scoliosis, no CVA tenderness Lungs:  clear to auscultation bilaterally, no wheezing, rhonchi or rales Abd: soft, nontender, no hepatomegaly Ext: no edema Musculoskeletal:  No deformities, BUE and BLE strength normal and equal Skin: warm and dry Neuro:  CNs 2-12 intact, no focal abnormalities noted Psych:  Normal affect    ECG: Sinus Rhythm  @65             Intervals  -/20/49  Axis -70       ASSESSMENT AND PLAN:

## 2011-07-19 NOTE — CV Procedure (Signed)
.  Preoperative diagnosis CHB, prev pacemaker Postoperative diagnosis same Procedure: Generator replacement    Following informed consent the patient was brought to the electrophysiology laboratory in place of the fluoroscopic table in the supine position after routine prep and drape lidocaine was infiltrated in the region of the previous incision and carried down to later the device pocket using sharp dissection and electrocautery. The pocket was opened the device was freed up and was explanted.  Interrogation of the previously implanted ventricular lead MDT [phone]  demonstrated an R wave of 5.2 millivolts., and impedance of 612ohms, and a pacing threshold of 0.5 volts at .5 msec.    The previously implanted atrial lead  MDT 5568 added P-wave amplitude of 1.5 illlivolts  and impedance of  534 ohms, and a pacing threshold of 1.2volts at 0.53milliseconds.  The leads were inspected. The leads were then attached to a MDT Adapta L pulse generator, serial number [id number]  P-synchronous/ AV  Pacing noted.    The pocket was irrigated with antibiotic containing saline solution hemostasis was assured and the leads and the device were placed in the pocket. The wound is then closed in2 layers in normal fashion.  The patient tolerated the procedure without apparent complication.  Virl Axe

## 2011-07-27 ENCOUNTER — Ambulatory Visit (INDEPENDENT_AMBULATORY_CARE_PROVIDER_SITE_OTHER): Payer: Medicare Other | Admitting: *Deleted

## 2011-07-27 ENCOUNTER — Encounter: Payer: Self-pay | Admitting: Internal Medicine

## 2011-07-27 DIAGNOSIS — I442 Atrioventricular block, complete: Secondary | ICD-10-CM

## 2011-07-27 LAB — PACEMAKER DEVICE OBSERVATION
AL IMPEDENCE PM: 548 Ohm
ATRIAL PACING PM: 6
BAMS-0001: 150 {beats}/min
RV LEAD IMPEDENCE PM: 584 Ohm
VENTRICULAR PACING PM: 100

## 2011-07-27 NOTE — Progress Notes (Signed)
Wound check-PPM 

## 2011-08-08 ENCOUNTER — Encounter: Payer: Self-pay | Admitting: Internal Medicine

## 2011-08-10 ENCOUNTER — Ambulatory Visit: Payer: Medicare Other | Admitting: Internal Medicine

## 2011-10-19 ENCOUNTER — Encounter (INDEPENDENT_AMBULATORY_CARE_PROVIDER_SITE_OTHER): Payer: Medicare Other | Admitting: Ophthalmology

## 2011-10-25 ENCOUNTER — Encounter (INDEPENDENT_AMBULATORY_CARE_PROVIDER_SITE_OTHER): Payer: Medicare Other | Admitting: Ophthalmology

## 2011-10-25 DIAGNOSIS — H35379 Puckering of macula, unspecified eye: Secondary | ICD-10-CM

## 2011-10-25 DIAGNOSIS — H35039 Hypertensive retinopathy, unspecified eye: Secondary | ICD-10-CM

## 2011-10-25 DIAGNOSIS — H3530 Unspecified macular degeneration: Secondary | ICD-10-CM

## 2011-10-25 DIAGNOSIS — H43819 Vitreous degeneration, unspecified eye: Secondary | ICD-10-CM

## 2011-10-25 DIAGNOSIS — I10 Essential (primary) hypertension: Secondary | ICD-10-CM

## 2011-10-31 ENCOUNTER — Ambulatory Visit (INDEPENDENT_AMBULATORY_CARE_PROVIDER_SITE_OTHER): Payer: Medicare Other | Admitting: Ophthalmology

## 2011-11-01 ENCOUNTER — Ambulatory Visit (INDEPENDENT_AMBULATORY_CARE_PROVIDER_SITE_OTHER): Payer: Medicare Other | Admitting: Ophthalmology

## 2011-11-01 DIAGNOSIS — I10 Essential (primary) hypertension: Secondary | ICD-10-CM

## 2011-11-01 DIAGNOSIS — H3530 Unspecified macular degeneration: Secondary | ICD-10-CM

## 2011-11-01 DIAGNOSIS — H43819 Vitreous degeneration, unspecified eye: Secondary | ICD-10-CM

## 2011-11-01 DIAGNOSIS — H35039 Hypertensive retinopathy, unspecified eye: Secondary | ICD-10-CM

## 2011-11-01 DIAGNOSIS — H35379 Puckering of macula, unspecified eye: Secondary | ICD-10-CM

## 2011-11-29 ENCOUNTER — Encounter (INDEPENDENT_AMBULATORY_CARE_PROVIDER_SITE_OTHER): Payer: Medicare Other | Admitting: Ophthalmology

## 2012-02-28 DIAGNOSIS — E871 Hypo-osmolality and hyponatremia: Secondary | ICD-10-CM

## 2012-02-28 HISTORY — DX: Hypo-osmolality and hyponatremia: E87.1

## 2012-04-23 ENCOUNTER — Encounter: Payer: Self-pay | Admitting: *Deleted

## 2012-05-10 ENCOUNTER — Telehealth: Payer: Self-pay | Admitting: Internal Medicine

## 2012-05-10 NOTE — Telephone Encounter (Signed)
Patient status now inactive in paceart.

## 2012-05-10 NOTE — Telephone Encounter (Signed)
pt called to let us know his device being checked by dr tilley/mt

## 2012-05-16 ENCOUNTER — Other Ambulatory Visit: Payer: Self-pay | Admitting: Dermatology

## 2012-06-30 ENCOUNTER — Encounter (HOSPITAL_COMMUNITY): Payer: Self-pay | Admitting: *Deleted

## 2012-06-30 ENCOUNTER — Emergency Department (INDEPENDENT_AMBULATORY_CARE_PROVIDER_SITE_OTHER)
Admission: EM | Admit: 2012-06-30 | Discharge: 2012-06-30 | Disposition: A | Discharge status: Home / Self Care | Payer: Medicare Other | Source: Home / Self Care | Attending: Family Medicine | Admitting: Family Medicine

## 2012-06-30 ENCOUNTER — Emergency Department (INDEPENDENT_AMBULATORY_CARE_PROVIDER_SITE_OTHER): Payer: Medicare Other

## 2012-06-30 DIAGNOSIS — J309 Allergic rhinitis, unspecified: Secondary | ICD-10-CM

## 2012-06-30 DIAGNOSIS — J302 Other seasonal allergic rhinitis: Secondary | ICD-10-CM

## 2012-06-30 MED ORDER — METHYLPREDNISOLONE SODIUM SUCC 125 MG IJ SOLR: 125.0000 mg | Freq: Once | INTRAMUSCULAR | Status: DC

## 2012-06-30 NOTE — ED Notes (Signed)
Patient complains of cough and congestion x 5 days. Patient states that he can't lie down due to not being able to breath. Denies fever/chills.

## 2012-06-30 NOTE — ED Provider Notes (Cosign Needed)
History     CSN: [id number]  Arrival date & time 06/30/12  1520   First MD Initiated Contact with Patient 06/30/12 1555      Chief Complaint  Patient presents with  . URI    (Consider location/radiation/quality/duration/timing/severity/associated sxs/prior treatment) Patient is a 79 y.o. male presenting with URI. The history is provided by the patient and the spouse.  URI Presenting symptoms: congestion, cough and rhinorrhea   Presenting symptoms: no fever   Severity:  Moderate Onset quality:  Gradual Duration:  5 days Timing:  Constant Progression:  Worsening Chronicity:  Chronic Associated symptoms: no wheezing   Risk factors: chronic respiratory disease     Past Medical History  Diagnosis Date  . Hypertension   . Pacemaker-Medtronic   . GERD (gastroesophageal reflux disease)   . Atrial fibrillation      on coumadin    . Atrioventricular block, complete     History reviewed. No pertinent past surgical history.  Family History  Problem Relation Age of Onset  . Heart disease Mother   . Rheum arthritis Mother     History  Substance Use Topics  . Smoking status: Former Smoker -- 1.00 packs/day for 2 years    Types: Cigarettes    Quit date: 07/17/1941  . Smokeless tobacco: Never Used  . Alcohol Use: No      Review of Systems  Constitutional: Negative.  Negative for fever.  HENT: Positive for congestion and rhinorrhea.   Respiratory: Positive for cough and shortness of breath. Negative for chest tightness and wheezing.   Cardiovascular: Negative.   Gastrointestinal: Negative.     Allergies  Sulfonamide derivatives  Home Medications   Current Outpatient Rx  Name  Route  Sig  Dispense  Refill  . alendronate (FOSAMAX) 70 MG tablet   Oral   Take 1 tablet by mouth every 7 (seven) days. wednesdays         . allopurinol (ZYLOPRIM) 300 MG tablet   Oral   Take 300 mg by mouth daily.          . beta carotene w/minerals (OCUVITE) tablet   Oral  Take 1 tablet by mouth daily.         [name] EXPIRED: budesonide-formoterol (SYMBICORT) 160-4.5 MCG/ACT inhaler   Inhalation   Inhale 2 puffs into the lungs 2 (two) times daily.   1 Inhaler   5   . Cholecalciferol (VITAMIN D3) 1000 UNITS CAPS   Oral   Take 2 capsules by mouth daily.          . colchicine 0.6 MG tablet   Oral   Take 0.6 mg by mouth daily as needed. For gout         . Docusate Calcium (STOOL SOFTENER PO)   Oral   Take 1 tablet by mouth daily.          . finasteride (PROSCAR) 5 MG tablet   Oral   Take 5 mg by mouth daily.          . fish oil-omega-3 fatty acids 1000 MG capsule   Oral   Take 1 g by mouth daily.         . flecainide (TAMBOCOR) 100 MG tablet   Oral   Take 50 mg by mouth 2 (two) times daily.          . flurazepam (DALMANE) 30 MG capsule   Oral   Take 30 mg by mouth at bedtime as needed. For sleep         .  hydrocodone-acetaminophen (LORCET-HD) 5-500 MG per capsule   Oral   Take 1 capsule by mouth as needed.         . levalbuterol (XOPENEX HFA) 45 MCG/ACT inhaler   Inhalation   Inhale 2 puffs into the lungs every 4 (four) hours as needed. Shortness of breath          . levothyroxine (SYNTHROID, LEVOTHROID) 50 MCG tablet   Oral   Take 50 mcg by mouth daily.          [name] losartan (COZAAR) 100 MG tablet   Oral   Take 1 tablet by mouth daily.         [name] lubiprostone (AMITIZA) 8 MCG capsule   Oral   Take 8 mcg by mouth as needed.         . methylcellulose oral powder   Oral   Take 1 packet by mouth daily.         . mometasone (NASONEX) 50 MCG/ACT nasal spray   Nasal   Place 2 sprays into the nose daily as needed. For nasal congestion         . Multiple Vitamin (MULTIVITAMIN) capsule   Oral   Take 1 capsule by mouth daily.           [name] omeprazole (PRILOSEC OTC) 20 MG tablet   Oral   Take 20 mg by mouth at bedtime.          . silodosin (RAPAFLO) 8 MG CAPS capsule   Oral   Take 8 mg by mouth every 3  (three) days as needed.         . traMADol (ULTRAM) 50 MG tablet   Oral   Take 1 tablet by mouth as needed. pain         . triamterene-hydrochlorothiazide (MAXZIDE-25) 37.5-25 MG per tablet   Oral   Take 1 tablet by mouth daily.          [name] warfarin (COUMADIN) 3 MG tablet   Oral   Take 1.5-3 mg by mouth at bedtime. Take 1/2 tab on Wednesday and 1 tab all other days           BP 151/70  Pulse 87  Temp(Src) 98.1 F (36.7 C) (Oral)  Resp 17  SpO2 96%  Physical Exam  Nursing note and vitals reviewed. Constitutional: He is oriented to person, place, and time. He appears well-developed and well-nourished.  HENT:  Head: Normocephalic.  Right Ear: External ear normal.  Left Ear: External ear normal.  Mouth/Throat: Oropharynx is clear and moist.  Neck: Normal range of motion. Neck supple.  Cardiovascular: Normal rate, regular rhythm and normal heart sounds.   Pulmonary/Chest: Effort normal and breath sounds normal.  Abdominal: Soft. There is no tenderness.  Neurological: He is alert and oriented to person, place, and time.  Skin: Skin is warm and dry.    ED Course  Procedures (including critical care time)  Labs Reviewed - No data to display Dg Chest 2 View  06/30/2012  *RADIOLOGY REPORT*  Clinical Data: Cough, shortness of breath  CHEST - 2 VIEW  Comparison: 06/15/2010  Findings: Cardiomediastinal silhouette is stable.  No pulmonary edema.  Mild basilar atelectasis.  Dual lead cardiac pacemaker is unchanged in position.  Bony thorax is stable.  IMPRESSION: No pulmonary edema.  Mild basilar atelectasis.   Original Report Authenticated By: Lahoma Crocker, M.D.      1. Seasonal allergic reaction       MDM  X-rays reviewed  and report per radiologist.         Billy Fischer, MD 06/30/12 [phone]

## 2012-07-03 ENCOUNTER — Emergency Department (HOSPITAL_COMMUNITY): Payer: Medicare Other

## 2012-07-03 ENCOUNTER — Inpatient Hospital Stay (HOSPITAL_COMMUNITY)
Admission: EM | Admit: 2012-07-03 | Discharge: 2012-07-06 | DRG: 640 | Disposition: A | Discharge status: Home / Self Care | Payer: Medicare Other | Attending: Internal Medicine | Admitting: Internal Medicine

## 2012-07-03 ENCOUNTER — Encounter (HOSPITAL_COMMUNITY): Payer: Self-pay | Admitting: *Deleted

## 2012-07-03 DIAGNOSIS — Z950 Presence of cardiac pacemaker: Secondary | ICD-10-CM

## 2012-07-03 DIAGNOSIS — E871 Hypo-osmolality and hyponatremia: Principal | ICD-10-CM | POA: Diagnosis present

## 2012-07-03 DIAGNOSIS — E039 Hypothyroidism, unspecified: Secondary | ICD-10-CM | POA: Diagnosis present

## 2012-07-03 DIAGNOSIS — Z66 Do not resuscitate: Secondary | ICD-10-CM | POA: Diagnosis present

## 2012-07-03 DIAGNOSIS — K560 Paralytic ileus: Secondary | ICD-10-CM | POA: Diagnosis present

## 2012-07-03 DIAGNOSIS — Z794 Long term (current) use of insulin: Secondary | ICD-10-CM

## 2012-07-03 DIAGNOSIS — R7301 Impaired fasting glucose: Secondary | ICD-10-CM | POA: Diagnosis present

## 2012-07-03 DIAGNOSIS — J45901 Unspecified asthma with (acute) exacerbation: Secondary | ICD-10-CM | POA: Diagnosis present

## 2012-07-03 DIAGNOSIS — I4891 Unspecified atrial fibrillation: Secondary | ICD-10-CM | POA: Diagnosis present

## 2012-07-03 DIAGNOSIS — Z79899 Other long term (current) drug therapy: Secondary | ICD-10-CM

## 2012-07-03 DIAGNOSIS — M109 Gout, unspecified: Secondary | ICD-10-CM | POA: Diagnosis present

## 2012-07-03 DIAGNOSIS — Z7901 Long term (current) use of anticoagulants: Secondary | ICD-10-CM

## 2012-07-03 DIAGNOSIS — N400 Enlarged prostate without lower urinary tract symptoms: Secondary | ICD-10-CM | POA: Diagnosis present

## 2012-07-03 DIAGNOSIS — J189 Pneumonia, unspecified organism: Secondary | ICD-10-CM | POA: Diagnosis present

## 2012-07-03 DIAGNOSIS — I10 Essential (primary) hypertension: Secondary | ICD-10-CM | POA: Diagnosis present

## 2012-07-03 DIAGNOSIS — E669 Obesity, unspecified: Secondary | ICD-10-CM | POA: Diagnosis present

## 2012-07-03 DIAGNOSIS — J45909 Unspecified asthma, uncomplicated: Secondary | ICD-10-CM | POA: Diagnosis present

## 2012-07-03 DIAGNOSIS — R112 Nausea with vomiting, unspecified: Secondary | ICD-10-CM | POA: Diagnosis present

## 2012-07-03 DIAGNOSIS — K567 Ileus, unspecified: Secondary | ICD-10-CM | POA: Diagnosis present

## 2012-07-03 LAB — URINALYSIS, ROUTINE W REFLEX MICROSCOPIC
Bilirubin Urine: NEGATIVE
Glucose, UA: NEGATIVE mg/dL
Hgb urine dipstick: NEGATIVE
Specific Gravity, Urine: 1.019 (ref 1.005–1.030)
Urobilinogen, UA: 0.2 mg/dL (ref 0.0–1.0)
pH: 7 (ref 5.0–8.0)

## 2012-07-03 LAB — CBC WITH DIFFERENTIAL/PLATELET
Eosinophils Absolute: 0 10*3/uL (ref 0.0–0.7)
Hemoglobin: 13.1 g/dL (ref 13.0–17.0)
Lymphocytes Relative: 9 % — ABNORMAL LOW (ref 12–46)
Lymphs Abs: 1.5 10*3/uL (ref 0.7–4.0)
MCH: 29.9 pg (ref 26.0–34.0)
MCV: 83.6 fL (ref 78.0–100.0)
Monocytes Relative: 6 % (ref 3–12)
Neutrophils Relative %: 85 % — ABNORMAL HIGH (ref 43–77)
RBC: 4.38 MIL/uL (ref 4.22–5.81)

## 2012-07-03 LAB — COMPREHENSIVE METABOLIC PANEL
Alkaline Phosphatase: 55 U/L (ref 39–117)
BUN: 24 mg/dL — ABNORMAL HIGH (ref 6–23)
CO2: 23 mEq/L (ref 19–32)
Chloride: 77 mEq/L — ABNORMAL LOW (ref 96–112)
GFR calc Af Amer: 65 mL/min — ABNORMAL LOW (ref >90)
GFR calc non Af Amer: 56 mL/min — ABNORMAL LOW (ref >90)
Glucose, Bld: 129 mg/dL — ABNORMAL HIGH (ref 70–99)
Potassium: 3.9 mEq/L (ref 3.5–5.1)
Total Bilirubin: 0.6 mg/dL (ref 0.3–1.2)
Total Protein: 6.9 g/dL (ref 6.0–8.3)

## 2012-07-03 LAB — LIPASE, BLOOD: Lipase: 15 U/L (ref 11–59)

## 2012-07-03 MED ORDER — ONDANSETRON HCL 4 MG/2ML IJ SOLN
INTRAMUSCULAR | Status: AC
  Filled 2012-07-03: qty 2

## 2012-07-03 MED ORDER — POTASSIUM CHLORIDE IN NACL 20-0.9 MEQ/L-% IV SOLN
Freq: Once | INTRAVENOUS | Status: AC
  Administered 2012-07-04: via INTRAVENOUS
  Filled 2012-07-03: qty 1000

## 2012-07-03 MED ORDER — ONDANSETRON HCL 4 MG/2ML IJ SOLN
4.0000 mg | Freq: Once | INTRAMUSCULAR | Status: AC
  Administered 2012-07-03: 4 mg via INTRAVENOUS

## 2012-07-03 MED ORDER — ONDANSETRON 4 MG PO TBDP
4.0000 mg | ORAL_TABLET | Freq: Once | ORAL | Status: AC
  Administered 2012-07-04: 4 mg via ORAL
  Filled 2012-07-03: qty 1

## 2012-07-03 MED ORDER — SODIUM CHLORIDE 0.9 % IV BOLUS (SEPSIS)
1000.0000 mL | Freq: Once | INTRAVENOUS | Status: AC
  Administered 2012-07-03: 1000 mL via INTRAVENOUS

## 2012-07-03 NOTE — H&P (Signed)
PCP:   Eric Rouse, MD   Chief Complaint:  Nausea and vomiting, weakness  HPI: [name] is an 79 year old white male with a history of asthma, atrial fibrillation on anticoagulation status post pacemaker placement who presented to the emergency department with a complaint of nausea/vomiting and severe weakness. Patient reports that he developed upper respiratory infection symptoms about one week ago consisting of head congestion, drainage, and cough. This worsened over the weekend prompting him to go to urgent care on Sunday 5/4 where chest x-ray was normal. His symptoms were felt to be secondary to allergies and he was discharged home on over-the-counter antihistamines. He was seen in our office the following day for followup or he reported that his symptoms were worsening with increasing cough. He also reported nausea and vomiting with dry heaves. At the end of the visit he began vomiting in our office. He was prescribed Zofran for nausea and Levaquin for his bronchitis. He was continued on his Symbicort and Xopenex for asthma. Over the past 3 days his symptoms have continued to worsen with persistent dry heaves. He has been unable eat for at least 4 days. His last bowel movement was 3 days ago. He has been able to keep down his medications and has continued taking his diuretic and Coumadin. Given the inability to maintain his hydration he presented to the emergency department tonight where he was found have a sodium of 112. Acute abdominal series shows possible ileus but no obstruction. He is being admitted for further management.  Review of Systems:  Review of Systems - All systems were reviewed with the patient and are negative except in history of present illness with following exceptions: Generalized fatigue and weakness, dry mouth.   Past Medical History: Asthma Afib on coumadin HTN IFG/borderline DM2 Microalbuminuria Hypothyroid Hyponatremia BPH Obesity Long-term anticoagulant  therapy Mitral valve d/o SVT Chronic LBP Chronic sinusitis Gout Syncope Osteoporosis BPH Hyperplastic polyps (5/03)  Past Surgical History Pacemaker 2005 Ablation 1990 Inguinal hernia repair 1969 Tympan. tubes   Medications: Prior to Admission medications   Medication Sig Start Date End Date Taking? Authorizing Provider  alendronate (FOSAMAX) 70 MG tablet Take 1 tablet by mouth every 7 (seven) days. wednesdays 06/17/10  Yes Historical Provider, MD  beta carotene w/minerals (OCUVITE) tablet Take 1 tablet by mouth daily.   Yes Historical Provider, MD  budesonide-formoterol (SYMBICORT) 160-4.5 MCG/ACT inhaler Inhale 2 puffs into the lungs 2 (two) times daily.   Yes Historical Provider, MD  Cholecalciferol (VITAMIN D3) 1000 UNITS CAPS Take 2 capsules by mouth daily.    Yes Historical Provider, MD  docusate sodium (COLACE) 100 MG capsule Take 100 mg by mouth 2 (two) times daily as needed for constipation.   Yes Historical Provider, MD  finasteride (PROSCAR) 5 MG tablet Take 2.5 mg by mouth 2 (two) times daily.    Yes Historical Provider, MD  fish oil-omega-3 fatty acids 1000 MG capsule Take 1 g by mouth daily.   Yes Historical Provider, MD  flecainide (TAMBOCOR) 100 MG tablet Take 50 mg by mouth 2 (two) times daily.  05/09/11  Yes Historical Provider, MD  flurazepam (DALMANE) 30 MG capsule Take 30 mg by mouth at bedtime as needed. For sleep   Yes Historical Provider, MD  hydrocodone-acetaminophen (LORCET-HD) 5-500 MG per capsule Take 1 capsule by mouth as needed.   Yes Historical Provider, MD  levalbuterol [facility] HFA) 45 MCG/ACT inhaler Inhale 2 puffs into the lungs every 4 (four) hours as needed. Shortness of breath  09/14/10  Yes Eric Delton, MD  levothyroxine (SYNTHROID, LEVOTHROID) 50 MCG tablet Take 50 mcg by mouth daily.    Yes Historical Provider, MD  losartan (COZAAR) 100 MG tablet Take 1 tablet by mouth daily. 07/28/10  Yes Historical Provider, MD  methylcellulose oral powder  Take 1 packet by mouth daily.   Yes Historical Provider, MD  mometasone (NASONEX) 50 MCG/ACT nasal spray Place 2 sprays into the nose daily as needed. For nasal congestion   Yes Historical Provider, MD  Multiple Vitamin (MULTIVITAMIN) capsule Take 1 capsule by mouth daily.     Yes Historical Provider, MD  omeprazole (PRILOSEC OTC) 20 MG tablet Take 20 mg by mouth at bedtime.    Yes Historical Provider, MD  ondansetron (ZOFRAN) 4 MG tablet Take 4 mg by mouth every 6 (six) hours as needed for nausea.   Yes Historical Provider, MD  silodosin (RAPAFLO) 8 MG CAPS capsule Take 8 mg by mouth every 3 (three) days as needed.   Yes Historical Provider, MD  traMADol (ULTRAM) 50 MG tablet Take 1 tablet by mouth as needed. pain 07/23/10  Yes Historical Provider, MD  triamterene-hydrochlorothiazide (MAXZIDE-25) 37.5-25 MG per tablet Take 1 tablet by mouth daily.    Yes Historical Provider, MD  warfarin (COUMADIN) 3 MG tablet Take 1.5-3 mg by mouth at bedtime. Take 1/2 tab on Wednesday and 1 tab all other days   Yes Historical Provider, MD  budesonide-formoterol (SYMBICORT) 160-4.5 MCG/ACT inhaler Inhale 2 puffs into the lungs 2 (two) times daily. 12/28/10 12/28/11  Eric Delton, MD  colchicine 0.6 MG tablet Take 0.6 mg by mouth daily as needed. For gout    Historical Provider, MD    Allergies:  No Known Allergies  Social History: Re-Married [name]).  1st wife deceased.  1 son, 1 daughter, 5 grandchildren. High school education.  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 Remote tobacco use, [place].   Family History: Father deceased at 72 (accident, PUD, ETOHism). Mother deceased at 35 (heart dz). Sister- deceased blood clot. Son & daughter healthy.  Physical Exam: Filed Vitals:   07/03/12 2008  BP: 167/94  Pulse: 84  Temp: 98.3 F (36.8 C)  TempSrc: Oral  Resp: 18  SpO2: 96%   General appearance: Appears fatigued, pale appearing. Head: Normocephalic, without obvious abnormality, atraumatic Eyes:  conjunctivae/corneas clear. PERRL, EOM's intact.  Nose: Nares normal. Septum midline. Mucosa normal. No drainage or sinus tenderness. Throat: lips, mucosa, and tongue normal; teeth and gums normal Neck: no adenopathy, no carotid bruit, no JVD and thyroid not enlarged, symmetric, no tenderness/mass/nodules Resp: Bilateral expiratory wheezing with left greater than right basilar rhonchi; possible egophony in the bilateral upper lobes; no dullness to percussion Cardio: regular rate and rhythm GI: Distended with hypoactive bowel sounds; no tenderness rebound or guarding. Extremities: extremities normal, atraumatic, no cyanosis or edema Pulses: 2+ and symmetric Lymph nodes:  no cervical lymphadenopathy Neurologic: Alert and oriented X 3, normal strength and tone. Normal symmetric reflexes.     Labs on Admission:   Recent Labs  07/03/12 2120  NA 112*  K 3.9  CL 77*  CO2 23  GLUCOSE 129*  BUN 24*  CREATININE 1.13  CALCIUM 8.7    Recent Labs  07/03/12 2120  AST 26  ALT 15  ALKPHOS 55  BILITOT 0.6  PROT 6.9  ALBUMIN 3.2*    Recent Labs  07/03/12 2120  LIPASE 15    Recent Labs  07/03/12 2120  WBC 17.4*  NEUTROABS 14.8*  HGB 13.1  HCT 36.6*  MCV 83.6  PLT 169   No results found for this basename: CKTOTAL, CKMB, CKMBINDEX, TROPONINI,  in the last 72 hours Lab Results  Component Value Date   INR 2.5* 07/18/2011   INR 2.55* 06/18/2010   INR 2.85* 06/15/2010   Urinalysis normal  Radiological Exams on Admission: Dg Chest 2 View  06/30/2012  *RADIOLOGY REPORT*  Clinical Data: Cough, shortness of breath  CHEST - 2 VIEW  Comparison: 06/15/2010  Findings: Cardiomediastinal silhouette is stable.  No pulmonary edema.  Mild basilar atelectasis.  Dual lead cardiac pacemaker is unchanged in position.  Bony thorax is stable.  IMPRESSION: No pulmonary edema.  Mild basilar atelectasis.   Original Report Authenticated By: Lahoma Crocker, M.D.    ACUTE ABDOMEN SERIES (ABDOMEN 2 VIEW &  CHEST 1 VIEW)  Comparison: 06/30/2012 and earlier.  Findings: Larger lung volumes. Stable left chest dual lead cardiac  pacemaker. Stable cardiac size and mediastinal contours.  Visualized tracheal air column is within normal limits. Apical  lung scarring is stable. No pneumothorax or pneumoperitoneum. Gas  in colon at the flexures. No acute pulmonary opacity identified.  Gas filled colon. No dilated small bowel identified. Gas is  present in the descending colon which appears more decompressed.  Chronic degenerative changes in the lumbar spine. Chronic right  pelvic phlebolith.  IMPRESSION:  1. Nonobstructed bowel gas pattern, no free air. Gas throughout  much of the colon might reflect ileus or chronic dysmotility.  2. Larger lung volumes. No acute cardiopulmonary abnormality.    Assessment/Plan Principal Problem: 1. Acute hyponatremia- severe hyponatremia is likely secondary to volume depletion in the setting of nausea/vomiting, diuretic use. 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 Will check urine sodium, serum/urine osmolality. Hydration with normal saline has been initiated in the emergency department. Will follow his electrolytes closely.  Active Problems: 2. Asthmatic Bronchitis, acute- we'll continue Levaquin to cover bacterial causes. Repeat chest x-ray is pending though there are no acute pulmonary findings on acute abdominal series. Continue Advair and Xopenex nebulizers.  3. Nausea and vomiting- compounded by his hyponatremia. We'll treat with IV antibiotics and IV fluid hydration. Consider CNS imaging if persistent that he does not have any neurologic findings. 4. Ileus- likely secondary to his electrolyte disturbance. Monitor with hydration. Clear liquid diet. Will advance as tolerated. 5. Atrial fibrillation- check PT/INR and continue Coumadin per pharmacy protocol to 6. Hypertension-blood pressure is elevated in the emergency department. We'll  continue home medications with exception of his diuretic and monitor. 7. Disposition- anticipate discharge to home in 2-3 days if stable.    Dawna Jakes,W [name] 07/03/2012, 10:57 PM

## 2012-07-03 NOTE — ED Notes (Signed)
Pt c/o constant nausea/vomiting and abd bloating. Family reports that pt has had a "chest cold all week." also reports poor appetite.

## 2012-07-03 NOTE — ED Notes (Signed)
Dr Wilson Singer notified of sodium 112; no orders given

## 2012-07-03 NOTE — ED Provider Notes (Signed)
History     CSN: [id number]  Arrival date & time 07/03/12  1945   First MD Initiated Contact with Patient 07/03/12 2307      Chief Complaint  Patient presents with  . Nausea  . Emesis  . Bloated   HPI [name] is a 79 y.o. male who presents with generalized weakness, nausea vomiting and abdominal bloating. Patient started feeling bad prior to last weekend summer on the middle of the week. Towards the end of the week, he saw his physician Dr. Brigitte Pulse who prescribed him Levaquin is he's also been having a cough.  Over the course of the last couple days the nausea vomiting and bloating has gotten worse, the patient has had a poor appetite and has not been to keep food or fluids down. Symptoms are severe, constant, with no alleviating or exacerbating factors. Patient is on Coumadin, has a cough but denies any chest pains, no known fevers, some chills, no abdominal pains, no back pains no shortness of breath.   Past Medical History  Diagnosis Date  . Hypertension   . Pacemaker-[employer]   . GERD (gastroesophageal reflux disease)   . Atrial fibrillation      on coumadin    . Atrioventricular block, complete     History reviewed. No pertinent past surgical history.  Family History  Problem Relation Age of Onset  . Heart disease Mother   . Rheum arthritis Mother     History  Substance Use Topics  . Smoking status: Former Smoker -- 1.00 packs/day for 2 years    Types: Cigarettes    Quit date: 07/17/1941  . Smokeless tobacco: Never Used  . Alcohol Use: No      Review of Systems At least 10pt or greater review of systems completed and are negative except where specified in the HPI.  Allergies  Review of patient's allergies indicates no known allergies.  Home Medications   Current Outpatient Rx  Name  Route  Sig  Dispense  Refill  . alendronate (FOSAMAX) 70 MG tablet   Oral   Take 1 tablet by mouth every 7 (seven) days. wednesdays         . beta carotene  w/minerals (OCUVITE) tablet   Oral   Take 1 tablet by mouth daily.         . budesonide-formoterol (SYMBICORT) 160-4.5 MCG/ACT inhaler   Inhalation   Inhale 2 puffs into the lungs 2 (two) times daily.         . Cholecalciferol (VITAMIN D3) 1000 UNITS CAPS   Oral   Take 2 capsules by mouth daily.          [name] docusate sodium (COLACE) 100 MG capsule   Oral   Take 100 mg by mouth 2 (two) times daily as needed for constipation.         . finasteride (PROSCAR) 5 MG tablet   Oral   Take 2.5 mg by mouth 2 (two) times daily.          . fish oil-omega-3 fatty acids 1000 MG capsule   Oral   Take 1 g by mouth daily.         . flecainide (TAMBOCOR) 100 MG tablet   Oral   Take 50 mg by mouth 2 (two) times daily.          . flurazepam (DALMANE) 30 MG capsule   Oral   Take 30 mg by mouth at bedtime as needed. For sleep         .  hydrocodone-acetaminophen (LORCET-HD) 5-500 MG per capsule   Oral   Take 1 capsule by mouth as needed.         . levalbuterol (XOPENEX HFA) 45 MCG/ACT inhaler   Inhalation   Inhale 2 puffs into the lungs every 4 (four) hours as needed. Shortness of breath          . levothyroxine (SYNTHROID, LEVOTHROID) 50 MCG tablet   Oral   Take 50 mcg by mouth daily.          [name] losartan (COZAAR) 100 MG tablet   Oral   Take 1 tablet by mouth daily.         . methylcellulose oral powder   Oral   Take 1 packet by mouth daily.         . mometasone (NASONEX) 50 MCG/ACT nasal spray   Nasal   Place 2 sprays into the nose daily as needed. For nasal congestion         . Multiple Vitamin (MULTIVITAMIN) capsule   Oral   Take 1 capsule by mouth daily.           [name] omeprazole (PRILOSEC OTC) 20 MG tablet   Oral   Take 20 mg by mouth at bedtime.          . ondansetron (ZOFRAN) 4 MG tablet   Oral   Take 4 mg by mouth every 6 (six) hours as needed for nausea.         . silodosin (RAPAFLO) 8 MG CAPS capsule   Oral   Take 8 mg by mouth  every 3 (three) days as needed.         . traMADol (ULTRAM) 50 MG tablet   Oral   Take 1 tablet by mouth as needed. pain         . triamterene-hydrochlorothiazide (MAXZIDE-25) 37.5-25 MG per tablet   Oral   Take 1 tablet by mouth daily.          [name] warfarin (COUMADIN) 3 MG tablet   Oral   Take 1.5-3 mg by mouth at bedtime. Take 1/2 tab on Wednesday and 1 tab all other days         . EXPIRED: budesonide-formoterol (SYMBICORT) 160-4.5 MCG/ACT inhaler   Inhalation   Inhale 2 puffs into the lungs 2 (two) times daily.   1 Inhaler   5   . colchicine 0.6 MG tablet   Oral   Take 0.6 mg by mouth daily as needed. For gout           BP 167/94  Pulse 84  Temp(Src) 98.3 F (36.8 C) (Oral)  Resp 18  SpO2 96%  Physical Exam  Nursing notes reviewed.  Electronic medical record reviewed. VITAL SIGNS:   Filed Vitals:   07/03/12 2008 07/04/12 0013 07/04/12 0040  BP: 167/94 155/90 158/78  Pulse: 84 83 80  Temp: 98.3 F (36.8 C) 98.2 F (36.8 C) 98.1 F (36.7 C)  TempSrc: Oral Oral Oral  Resp: 18 20 18   Height:   5' 11.5" (1.816 m)  Weight:   219 lb 2.2 oz (99.4 kg)  SpO2: 96% 97% 98%   CONSTITUTIONAL: Awake, oriented, appears non-toxic HENT: Atraumatic, normocephalic, oral mucosa pink and moist, airway patent. Nares patent without drainage. External ears normal. EYES: Conjunctiva clear, EOMI, PERRLA NECK: Trachea midline, non-tender, supple CARDIOVASCULAR: Normal heart rate, Normal rhythm, No murmurs, rubs, gallops PULMONARY/CHEST: Rales in the left base, slightly decreased in the left base. Non-tender. Pacer in the  left upper chest ABDOMINAL: Non-distended, soft, non-tender - no rebound or guarding.  BS normal. NEUROLOGIC: Non-focal, moving all four extremities, no gross sensory or motor deficits. EXTREMITIES: No clubbing, cyanosis, or edema SKIN: Warm, Dry, No erythema, No rash  ED Course  Procedures (including critical care time)  Labs Reviewed  CBC WITH  DIFFERENTIAL - Abnormal; Notable for the following:    WBC 17.4 (*)    HCT 36.6 (*)    Neutrophils Relative 85 (*)    Neutro Abs 14.8 (*)    Lymphocytes Relative 9 (*)    Monocytes Absolute 1.1 (*)    All other components within normal limits  COMPREHENSIVE METABOLIC PANEL - Abnormal; Notable for the following:    Sodium 112 (*)    Chloride 77 (*)    Glucose, Bld 129 (*)    BUN 24 (*)    Albumin 3.2 (*)    GFR calc non Af Amer 56 (*)    GFR calc Af Amer 65 (*)    All other components within normal limits  PROTIME-INR - Abnormal; Notable for the following:    Prothrombin Time 26.2 (*)    INR 2.55 (*)    All other components within normal limits  LIPASE, BLOOD  URINALYSIS, ROUTINE W REFLEX MICROSCOPIC  URINALYSIS, MICROSCOPIC ONLY  OSMOLALITY  SODIUM, URINE, RANDOM  CREATININE, URINE, RANDOM  ETHANOL  LEGIONELLA ANTIGEN, URINE  CBC  BASIC METABOLIC PANEL   Dg Chest 1 View  07/04/2012  *RADIOLOGY REPORT*  Clinical Data: Nausea and vomiting, concern for pneumonia  CHEST - 1 VIEW  Comparison: PA views same day 07/03/2012, chest radiograph 06/30/2012  Findings: ,Compared to chest radiograph of 06/30/2012 there is increased lower lung density.  This could be on the left or right.  IMPRESSION: Concern for lower lobe pneumonia.   Original Report Authenticated By: Suzy Bouchard, M.D.    Dg Abd Acute W/chest  07/03/2012  *RADIOLOGY REPORT*  Clinical Data: 79 year old male nausea vomiting abdominal pain.  ACUTE ABDOMEN SERIES (ABDOMEN 2 VIEW & CHEST 1 VIEW)  Comparison: 06/30/2012 and earlier.  Findings: Larger lung volumes.  Stable left chest dual lead cardiac pacemaker.  Stable cardiac size and mediastinal contours. Visualized tracheal air column is within normal limits.  Apical lung scarring is stable.  No pneumothorax or pneumoperitoneum.  Gas in colon at the flexures.  No acute pulmonary opacity identified.  Gas filled colon.  No dilated small bowel identified.  Gas is present in the  descending colon which appears more decompressed. Chronic degenerative changes in the lumbar spine.  Chronic right pelvic phlebolith.  IMPRESSION: 1. Nonobstructed bowel gas pattern, no free air.  Gas throughout much of the colon might reflect ileus or chronic dysmotility. 2.  Larger lung volumes. No acute cardiopulmonary abnormality.   Original Report Authenticated By: Roselyn Reef, M.D.      1. Hyponatremia   2. Community acquired pneumonia   3. Ileus   4. Nausea and vomiting       MDM   This is a very nice 79 year old gentleman who is nontoxic, afebrile on presentation does have some changes in the left lung base concerning for pneumonia, patient has been on Levaquin for the last 2 days. As I finish my assessment, Dr. Brigitte Pulse came to the ER to evaluate the patient and admit him.  Patient also has hyponatremia, it started a normal saline drip it 125 an hour to slowly corrected. Based on hyponatremia, possible pneumonia also obtain urine for Legionella, as  well as a urine for sodium and urine for creatinine.  Admit patient stable and in good condition.      Rhunette Croft, MD 07/04/12 [id number]

## 2012-07-04 ENCOUNTER — Inpatient Hospital Stay (HOSPITAL_COMMUNITY): Payer: Medicare Other

## 2012-07-04 LAB — LEGIONELLA ANTIGEN, URINE

## 2012-07-04 LAB — CBC
HCT: 32.5 % — ABNORMAL LOW (ref 39.0–52.0)
Hemoglobin: 11.7 g/dL — ABNORMAL LOW (ref 13.0–17.0)
MCHC: 36 g/dL (ref 30.0–36.0)
MCV: 83.3 fL (ref 78.0–100.0)
RDW: 12.1 % (ref 11.5–15.5)
WBC: 12.6 10*3/uL — ABNORMAL HIGH (ref 4.0–10.5)

## 2012-07-04 LAB — BASIC METABOLIC PANEL
BUN: 20 mg/dL (ref 6–23)
BUN: 19 mg/dL (ref 6–23)
CO2: 24 mEq/L (ref 19–32)
CO2: 23 mEq/L (ref 19–32)
Calcium: 8 mg/dL — ABNORMAL LOW (ref 8.4–10.5)
Chloride: 79 mEq/L — ABNORMAL LOW (ref 96–112)
Chloride: 81 mEq/L — ABNORMAL LOW (ref 96–112)
Chloride: 82 mEq/L — ABNORMAL LOW (ref 96–112)
Creatinine, Ser: 1.16 mg/dL (ref 0.50–1.35)
GFR calc Af Amer: 63 mL/min — ABNORMAL LOW (ref >90)
GFR calc non Af Amer: 56 mL/min — ABNORMAL LOW (ref >90)
Glucose, Bld: 109 mg/dL — ABNORMAL HIGH (ref 70–99)
Glucose, Bld: 163 mg/dL — ABNORMAL HIGH (ref 70–99)
Glucose, Bld: 115 mg/dL — ABNORMAL HIGH (ref 70–99)
Potassium: 3.9 mEq/L (ref 3.5–5.1)
Sodium: 115 mEq/L — CL (ref 135–145)
Sodium: 116 mEq/L — CL (ref 135–145)

## 2012-07-04 LAB — PROTIME-INR: Prothrombin Time: 26.2 seconds — ABNORMAL HIGH (ref 11.6–15.2)

## 2012-07-04 LAB — SODIUM, URINE, RANDOM: Sodium, Ur: 78 mEq/L

## 2012-07-04 LAB — CREATININE, URINE, RANDOM: Creatinine, Urine: 70.8 mg/dL

## 2012-07-04 LAB — OSMOLALITY: Osmolality: 247 mOsm/kg — ABNORMAL LOW (ref 275–300)

## 2012-07-04 MED ORDER — BUDESONIDE-FORMOTEROL FUMARATE 160-4.5 MCG/ACT IN AERO
2.0000 | INHALATION_SPRAY | Freq: Two times a day (BID) | RESPIRATORY_TRACT | Status: DC
  Administered 2012-07-04 – 2012-07-06 (×5): 2 via RESPIRATORY_TRACT
  Filled 2012-07-04: qty 6

## 2012-07-04 MED ORDER — WARFARIN - PHARMACIST DOSING INPATIENT: Freq: Every day | Status: DC

## 2012-07-04 MED ORDER — LEVALBUTEROL HCL 0.63 MG/3ML IN NEBU
0.6300 mg | INHALATION_SOLUTION | Freq: Four times a day (QID) | RESPIRATORY_TRACT | Status: DC | PRN
  Filled 2012-07-04: qty 3

## 2012-07-04 MED ORDER — WARFARIN SODIUM 3 MG PO TABS
3.0000 mg | ORAL_TABLET | Freq: Once | ORAL | Status: AC
  Administered 2012-07-04: 3 mg via ORAL
  Filled 2012-07-04: qty 1

## 2012-07-04 MED ORDER — LEVOFLOXACIN IN D5W 750 MG/150ML IV SOLN
750.0000 mg | Freq: Every day | INTRAVENOUS | Status: DC
  Administered 2012-07-04 (×2): 750 mg via INTRAVENOUS
  Filled 2012-07-04 (×2): qty 150

## 2012-07-04 MED ORDER — FLUTICASONE PROPIONATE 50 MCG/ACT NA SUSP
1.0000 | Freq: Every day | NASAL | Status: DC
  Administered 2012-07-04 – 2012-07-06 (×3): 1 via NASAL
  Filled 2012-07-04: qty 16

## 2012-07-04 MED ORDER — LOSARTAN POTASSIUM 50 MG PO TABS
100.0000 mg | ORAL_TABLET | Freq: Every day | ORAL | Status: DC
  Administered 2012-07-04 – 2012-07-06 (×3): 100 mg via ORAL
  Filled 2012-07-04 (×3): qty 2

## 2012-07-04 MED ORDER — ONDANSETRON 8 MG/NS 50 ML IVPB
8.0000 mg | Freq: Four times a day (QID) | INTRAVENOUS | Status: DC | PRN
Start: 2012-07-04 — End: 2012-07-06
  Administered 2012-07-04: 8 mg via INTRAVENOUS
  Filled 2012-07-04 (×2): qty 8

## 2012-07-04 MED ORDER — LEVALBUTEROL HCL 0.63 MG/3ML IN NEBU
0.6300 mg | INHALATION_SOLUTION | Freq: Four times a day (QID) | RESPIRATORY_TRACT | Status: DC
  Administered 2012-07-04 – 2012-07-05 (×3): 0.63 mg via RESPIRATORY_TRACT
  Filled 2012-07-04 (×9): qty 3

## 2012-07-04 MED ORDER — OMEPRAZOLE MAGNESIUM 20 MG PO TBEC: 20.0000 mg | DELAYED_RELEASE_TABLET | Freq: Every day | ORAL | Status: DC

## 2012-07-04 MED ORDER — MULTIVITAMINS PO CAPS: 1.0000 | ORAL_CAPSULE | Freq: Every day | ORAL | Status: DC

## 2012-07-04 MED ORDER — ACETAMINOPHEN 650 MG RE SUPP: 650.0000 mg | Freq: Four times a day (QID) | RECTAL | Status: DC | PRN

## 2012-07-04 MED ORDER — TRAMADOL HCL 50 MG PO TABS
50.0000 mg | ORAL_TABLET | Freq: Four times a day (QID) | ORAL | Status: DC | PRN
  Administered 2012-07-04: 50 mg via ORAL
  Filled 2012-07-04: qty 1

## 2012-07-04 MED ORDER — TAMSULOSIN HCL 0.4 MG PO CAPS
0.4000 mg | ORAL_CAPSULE | Freq: Every day | ORAL | Status: DC
  Administered 2012-07-04 – 2012-07-05 (×3): 0.4 mg via ORAL
  Filled 2012-07-04 (×4): qty 1

## 2012-07-04 MED ORDER — FLURAZEPAM HCL 30 MG PO CAPS: 30.0000 mg | ORAL_CAPSULE | Freq: Every evening | ORAL | Status: DC | PRN

## 2012-07-04 MED ORDER — POTASSIUM CHLORIDE IN NACL 20-0.9 MEQ/L-% IV SOLN
INTRAVENOUS | Status: DC
  Administered 2012-07-04 – 2012-07-06 (×5): via INTRAVENOUS
  Filled 2012-07-04 (×8): qty 1000

## 2012-07-04 MED ORDER — ZOLPIDEM TARTRATE 5 MG PO TABS
5.0000 mg | ORAL_TABLET | Freq: Every evening | ORAL | Status: DC | PRN
  Administered 2012-07-04 – 2012-07-05 (×3): 5 mg via ORAL
  Filled 2012-07-04 (×3): qty 1

## 2012-07-04 MED ORDER — FINASTERIDE 5 MG PO TABS
2.5000 mg | ORAL_TABLET | Freq: Two times a day (BID) | ORAL | Status: DC
  Administered 2012-07-04 – 2012-07-06 (×5): 2.5 mg via ORAL
  Filled 2012-07-04 (×6): qty 0.5

## 2012-07-04 MED ORDER — PANTOPRAZOLE SODIUM 40 MG PO TBEC
40.0000 mg | DELAYED_RELEASE_TABLET | Freq: Every day | ORAL | Status: DC
  Administered 2012-07-04 – 2012-07-05 (×2): 40 mg via ORAL
  Filled 2012-07-04 (×3): qty 1

## 2012-07-04 MED ORDER — ACETAMINOPHEN 325 MG PO TABS: 650.0000 mg | ORAL_TABLET | Freq: Four times a day (QID) | ORAL | Status: DC | PRN

## 2012-07-04 MED ORDER — LEVOTHYROXINE SODIUM 50 MCG PO TABS
50.0000 ug | ORAL_TABLET | Freq: Every day | ORAL | Status: DC
  Administered 2012-07-04 – 2012-07-06 (×3): 50 ug via ORAL
  Filled 2012-07-04 (×4): qty 1

## 2012-07-04 MED ORDER — PROMETHAZINE HCL 25 MG/ML IJ SOLN
12.5000 mg | Freq: Four times a day (QID) | INTRAMUSCULAR | Status: DC | PRN
  Administered 2012-07-04: 12.5 mg via INTRAVENOUS
  Filled 2012-07-04: qty 1

## 2012-07-04 MED ORDER — DOCUSATE SODIUM 100 MG PO CAPS
100.0000 mg | ORAL_CAPSULE | Freq: Two times a day (BID) | ORAL | Status: DC
  Administered 2012-07-04 – 2012-07-06 (×6): 100 mg via ORAL
  Filled 2012-07-04 (×7): qty 1

## 2012-07-04 MED ORDER — FLECAINIDE ACETATE 50 MG PO TABS
50.0000 mg | ORAL_TABLET | Freq: Two times a day (BID) | ORAL | Status: DC
  Administered 2012-07-04 – 2012-07-06 (×6): 50 mg via ORAL
  Filled 2012-07-04 (×7): qty 1

## 2012-07-04 MED ORDER — ADULT MULTIVITAMIN W/MINERALS CH
1.0000 | ORAL_TABLET | Freq: Every day | ORAL | Status: DC
  Administered 2012-07-04 – 2012-07-06 (×3): 1 via ORAL
  Filled 2012-07-04 (×3): qty 1

## 2012-07-04 NOTE — Care Management Note (Signed)
CM spoke with patient at bedside concerning dc planning with spouse present. Pt lives home with spouse but pt notes spouse having hx of dementia requiring daughter-n-law to assist in home care. Pt offered choice list of [facility] agencies in [place]. Pt request HHRN for dx & medication management. Per pt choice [facility] to provide [facility] services upon discharge. [facility] rep Kristen notified of referral.   Arlean Hopping [phone]

## 2012-07-04 NOTE — Progress Notes (Signed)
Subjective: He reports that he feels better though still having dry heaves and abdominal pain.  Abdominal pain with bloating is a little better.  Cough with "sticky" white sputum.  Objective: Vital signs in last 24 hours: Temp:  [98.1 F (36.7 C)-98.3 F (36.8 C)] 98.1 F (36.7 C) (05/08 0453) Pulse Rate:  [74-84] 74 (05/08 0453) Resp:  [18-20] 18 (05/08 0453) BP: (138-167)/(62-94) 138/62 mmHg (05/08 0453) SpO2:  [96 %-98 %] 97 % (05/08 0453) Weight:  [99.4 kg (219 lb 2.2 oz)] 99.4 kg (219 lb 2.2 oz) (05/08 0040) Weight change:  Last BM Date: 06/01/12  CBG (last 3)  No results found for this basename: GLUCAP,  in the last 72 hours  Intake/Output from previous day: 05/07 0701 - 05/08 0700 In: 120 [P.O.:120] Out: 175 [Urine:175] Intake/Output this shift:    General appearance: alert and fatigued, looks a little stronger Eyes: no scleral icterus Throat: oropharynx moist without erythema Resp: bilateral expiratory wheezing most prominent in the bases Cardio: regular rate and rhythm GI: less distended with hypoactive bowel sounds, no rebound or guarding Extremities: no clubbing, cyanosis or edema   Lab Results:  Recent Labs  07/03/12 2120 07/04/12 0441  NA 112* 113*  K 3.9 4.0  CL 77* 79*  CO2 23 26  GLUCOSE 129* 109*  BUN 24* 20  CREATININE 1.13 1.16  CALCIUM 8.7 7.9*    Recent Labs  07/03/12 2120  AST 26  ALT 15  ALKPHOS 55  BILITOT 0.6  PROT 6.9  ALBUMIN 3.2*    Recent Labs  07/03/12 2120 07/04/12 0441  WBC 17.4* 12.6*  NEUTROABS 14.8*  --   HGB 13.1 11.7*  HCT 36.6* 32.5*  MCV 83.6 83.3  PLT 169 150   Lab Results  Component Value Date   INR 2.69* 07/04/2012   INR 2.55* 07/04/2012   INR 2.5* 07/18/2011    Studies/Results: Dg Chest 1 View  07/04/2012  *RADIOLOGY REPORT*  Clinical Data: Nausea and vomiting, concern for pneumonia  CHEST - 1 VIEW  Comparison: PA views same day 07/03/2012, chest radiograph 06/30/2012  Findings: ,Compared to chest  radiograph of 06/30/2012 there is increased lower lung density.  This could be on the left or right.  IMPRESSION: Concern for lower lobe pneumonia.   Original Report Authenticated By: Suzy Bouchard, M.D.    Dg Abd Acute W/chest  07/03/2012  *RADIOLOGY REPORT*  Clinical Data: 79 year old male nausea vomiting abdominal pain.  ACUTE ABDOMEN SERIES (ABDOMEN 2 VIEW & CHEST 1 VIEW)  Comparison: 06/30/2012 and earlier.  Findings: Larger lung volumes.  Stable left chest dual lead cardiac pacemaker.  Stable cardiac size and mediastinal contours. Visualized tracheal air column is within normal limits.  Apical lung scarring is stable.  No pneumothorax or pneumoperitoneum.  Gas in colon at the flexures.  No acute pulmonary opacity identified.  Gas filled colon.  No dilated small bowel identified.  Gas is present in the descending colon which appears more decompressed. Chronic degenerative changes in the lumbar spine.  Chronic right pelvic phlebolith.  IMPRESSION: 1. Nonobstructed bowel gas pattern, no free air.  Gas throughout much of the colon might reflect ileus or chronic dysmotility. 2.  Larger lung volumes. No acute cardiopulmonary abnormality.   Original Report Authenticated By: Roselyn Reef, M.D.      Medications: Scheduled: . budesonide-formoterol  2 puff Inhalation BID  . docusate sodium  100 mg Oral BID  . finasteride  2.5 mg Oral BID  . flecainide  50  mg Oral BID  . fluticasone  1 spray Each Nare Daily  . levofloxacin (LEVAQUIN) IV  750 mg Intravenous QHS  . levothyroxine  50 mcg Oral Daily  . losartan  100 mg Oral Daily  . multivitamin with minerals  1 tablet Oral Daily  . ondansetron      . pantoprazole  40 mg Oral QHS  . tamsulosin  0.4 mg Oral QHS  . warfarin  3 mg Oral Once  . Warfarin - Pharmacist Dosing Inpatient   Does not apply q1800   Continuous:   Assessment/Plan: Principal Problem:  1. Acute hyponatremia- severe hyponatremia is likely secondary to volume depletion in the setting  of nausea/vomiting, diuretic use. 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 Urine sodium and serum Osm noted.  Urine osmolality ordered. Continue NS hydration and repeat BMET this afternoon.    Active Problems:  2. Asthmatic Bronchitis, acute/possible pneumonia- we'll continue Levaquin (Day #4) to cover bacterial causes. Repeat chest x-ray (PA and lateral) to evaluate further.  Continue Advair and Xopenex nebulizers.  3. Nausea and vomiting- compounded by his hyponatremia. Patient reports some improvement.  We'll treat with IV antiemetics and IV fluid hydration. Consider CNS imaging if persistent that he does not have any neurologic findings.  4. Ileus- likely secondary to his electrolyte disturbance. Slight improvement.  Monitor with hydration. Clear liquid diet- advance as tolerated.  5. Atrial fibrillation- Continue Coumadin per pharmacy protocol. 6. Hypertension-blood pressure improved. We'll continue home medications with exception of his diuretic and monitor.  7. Disposition- anticipate discharge to home in 2-3 days if stable.    LOS: 1 day   Tecia Cinnamon,W [name] 07/04/2012, 8:19 AM

## 2012-07-04 NOTE — Progress Notes (Signed)
CRITICAL VALUE ALERT  Critical value received:  Serum osmolality   Date of notification:  07/04/2012  Time of notification:  0800  Critical value read back:yes  Nurse who received alert:  Drue Dun, RN  MD notified (1st page):  Brigitte Pulse  Time of first page:  0815  MD notified (2nd page):  Time of second page:  Responding MD:  Brigitte Pulse  Time MD responded:  [email or web address].Helper - MD arrived on floor to assess patient

## 2012-07-04 NOTE — Progress Notes (Signed)
CRITICAL VALUE ALERT  Critical value received:  Sodium - 115  Date of notification:  07/04/2012  Time of notification:  [id number]  Critical value read back:yes  Nurse who received alert:  Aldean Baker, RN  MD notified (1st page):  Avva  Time of first page:  1145  MD notified (2nd page):  Time of second page:  Responding MD:  Avva  Time MD responded:  [id number] - orders to continue fluids that he had ordered previously as well as redraw BMP at 1800

## 2012-07-04 NOTE — Progress Notes (Signed)
CRITICAL VALUE ALERT  Critical value received:  Sodium 116  Date of notification:  07/04/2012  Time of notification:  1850  Critical value read back:yes  Nurse who received alert:  Aldean Baker, RN  MD notified (1st page):  Dr. Forde Dandy  Time of first page:  1855  MD notified (2nd page):  Time of second page:  Responding MD:  Dr. Forde Dandy  Time MD responded:  36 - no new orders as sodium a little improved from admission. Just informed to make sure labs are drawn in the AM

## 2012-07-04 NOTE — Progress Notes (Signed)
ANTICOAGULATION CONSULT NOTE - Initial Consult  Pharmacy Consult for Warfarin Indication: atrial fibrillation  No Known Allergies  Patient Measurements: Height: 5' 11.5" (181.6 cm) Weight: 219 lb 2.2 oz (99.4 kg) IBW/kg (Calculated) : 76.45   Vital Signs: Temp: 98.1 F (36.7 C) (05/08 0040) Temp src: Oral (05/08 0040) BP: 158/78 mmHg (05/08 0040) Pulse Rate: 80 (05/08 0040)  Labs:  Recent Labs  07/03/12 2120 07/04/12 0040  HGB 13.1  --   HCT 36.6*  --   PLT 169  --   LABPROT  --  26.2*  INR  --  2.55*  CREATININE 1.13  --     Estimated Creatinine Clearance: 55.8 ml/min (by C-G formula based on Cr of 1.13).   Medical History: Past Medical History  Diagnosis Date  . Hypertension   . Pacemaker-[employer]tr[employer]   . GERD (gastroesophageal reflux disease)   . Atrial fibrillation      on coumadin    . Atrioventricular block, complete     Medications:  Scheduled:  . [COMPLETED] 0.9 % NaCl with KCl 20 mEq / L   Intravenous Once  . budesonide-formoterol  2 puff Inhalation BID  . docusate sodium  100 mg Oral BID  . finasteride  2.5 mg Oral BID  . flecainide  50 mg Oral BID  . fluticasone  1 spray Each Nare Daily  . levofloxacin (LEVAQUIN) IV  750 mg Intravenous QHS  . levothyroxine  50 mcg Oral Daily  . losartan  100 mg Oral Daily  . multivitamin with minerals  1 tablet Oral Daily  . ondansetron      . [COMPLETED] ondansetron (ZOFRAN) IV  4 mg Intravenous Once  . [COMPLETED] ondansetron  4 mg Oral Once  . pantoprazole  40 mg Oral QHS  . [COMPLETED] sodium chloride  1,000 mL Intravenous Once  . tamsulosin  0.4 mg Oral QHS  . [DISCONTINUED] multivitamin  1 capsule Oral Daily  . [DISCONTINUED] omeprazole  20 mg Oral QHS   Infusions:    Assessment: 79 yo with history of HTN, pacemaker, Gerd on chronic coumadin for A-fib.  On Levaquin x 2 days for PNA.  Goal of Therapy:  INR 2-3    Plan:   Coumadin 3mg  x1 this am.  Daily PT/INR  Education  Lawana Pai R 07/04/2012,2:19 AM

## 2012-07-04 NOTE — Progress Notes (Signed)
CRITICAL VALUE ALERT  Critical value received:  Na 113  Date of notification:  07/04/12  Time of notification:  0556  Critical value read back: yes  Nurse who received alert:  Wilhemina Cash  MD notified (1st page):  Raliegh Ip Schorr  Time of first page:  0559  MD notified (2nd page):  Time of second page:  Responding MD:  Lamar Blinks  Time MD responded:  No new orders recieved

## 2012-07-05 DIAGNOSIS — J189 Pneumonia, unspecified organism: Secondary | ICD-10-CM | POA: Diagnosis present

## 2012-07-05 LAB — BASIC METABOLIC PANEL
BUN: 17 mg/dL (ref 6–23)
CO2: 25 mEq/L (ref 19–32)
Chloride: 86 mEq/L — ABNORMAL LOW (ref 96–112)
Creatinine, Ser: 1.2 mg/dL (ref 0.50–1.35)
GFR calc Af Amer: 61 mL/min — ABNORMAL LOW (ref >90)
Potassium: 4.2 mEq/L (ref 3.5–5.1)

## 2012-07-05 LAB — CBC
HCT: 34.4 % — ABNORMAL LOW (ref 39.0–52.0)
Hemoglobin: 12.1 g/dL — ABNORMAL LOW (ref 13.0–17.0)
MCV: 83.7 fL (ref 78.0–100.0)
RBC: 4.11 MIL/uL — ABNORMAL LOW (ref 4.22–5.81)
WBC: 11.6 10*3/uL — ABNORMAL HIGH (ref 4.0–10.5)

## 2012-07-05 LAB — PROTIME-INR: INR: 2.37 — ABNORMAL HIGH (ref 0.00–1.49)

## 2012-07-05 MED ORDER — POLYETHYLENE GLYCOL 3350 17 G PO PACK
17.0000 g | PACK | Freq: Every day | ORAL | Status: DC
  Administered 2012-07-05 – 2012-07-06 (×2): 17 g via ORAL
  Filled 2012-07-05 (×2): qty 1

## 2012-07-05 MED ORDER — LEVALBUTEROL HCL 0.63 MG/3ML IN NEBU
0.6300 mg | INHALATION_SOLUTION | Freq: Four times a day (QID) | RESPIRATORY_TRACT | Status: DC | PRN
  Filled 2012-07-05: qty 3

## 2012-07-05 MED ORDER — LEVOFLOXACIN 500 MG PO TABS
500.0000 mg | ORAL_TABLET | Freq: Every day | ORAL | Status: DC
  Administered 2012-07-05: 500 mg via ORAL
  Filled 2012-07-05: qty 1

## 2012-07-05 MED ORDER — ENSURE COMPLETE PO LIQD
237.0000 mL | Freq: Two times a day (BID) | ORAL | Status: DC
  Administered 2012-07-05: 237 mL via ORAL

## 2012-07-05 MED ORDER — AMOXICILLIN-POT CLAVULANATE 875-125 MG PO TABS
1.0000 | ORAL_TABLET | Freq: Two times a day (BID) | ORAL | Status: DC
  Administered 2012-07-05 – 2012-07-06 (×2): 1 via ORAL
  Filled 2012-07-05 (×3): qty 1

## 2012-07-05 MED ORDER — WARFARIN SODIUM 3 MG PO TABS
3.0000 mg | ORAL_TABLET | Freq: Once | ORAL | Status: AC
  Administered 2012-07-05: 3 mg via ORAL
  Filled 2012-07-05: qty 1

## 2012-07-05 NOTE — Progress Notes (Signed)
Nutrition Brief Note  Patient identified on the Malnutrition Screening Tool (MST) Report  Body mass index is 30.14 kg/(m^2). Patient meets criteria for class I obesity based on current BMI.   Current diet order is heart healthy, patient is consuming approximately 100% of meals at this time. Labs and medications reviewed. Pt with low sodium. Pt admitted with nausea/vomiting and bloating with poor appetite. Pt reports poor intake for the past week but pt's weight has been stable. Pt denies any nausea/vomiting today. Pt c/o some changes in taste and is interested in getting Ensure Complete for additional nutrition.   No further nutrition interventions warranted at this time. If nutrition issues arise, please consult RD.   Eric College MS, [place], [place] Pager [phone] After Hours Pager

## 2012-07-05 NOTE — Progress Notes (Addendum)
ANTICOAGULATION CONSULT NOTE - Follow Up  Pharmacy Consult for Warfarin Indication: atrial fibrillation  No Known Allergies  Patient Measurements: Height: 5' 11.5" (181.6 cm) Weight: 219 lb 2.2 oz (99.4 kg) IBW/kg (Calculated) : 76.45   Vital Signs: Temp: 97.5 F (36.4 C) (05/09 0617) Temp src: Oral (05/09 0617) BP: 139/60 mmHg (05/09 0617) Pulse Rate: 81 (05/09 0617)  Labs:  Recent Labs  07/03/12 2120 07/04/12 0040 07/04/12 0441 07/04/12 0509 07/04/12 1104 07/04/12 1749 07/05/12 0420  HGB 13.1  --  11.7*  --   --   --  12.1*  HCT 36.6*  --  32.5*  --   --   --  34.4*  PLT 169  --  150  --   --   --  183  LABPROT  --  26.2*  --  27.3*  --   --  24.8*  INR  --  2.55*  --  2.69*  --   --  2.37*  CREATININE 1.13  --  1.16  --  1.11 1.14 1.20    Estimated Creatinine Clearance: 52.6 ml/min (by C-G formula based on Cr of 1.2).   Medical History: Past Medical History  Diagnosis Date  . Hypertension   . Pacemaker-[employer]tr[employer]   . GERD (gastroesophageal reflux disease)   . Atrial fibrillation      on coumadin    . Atrioventricular block, complete     Medications:  Scheduled:  . budesonide-formoterol  2 puff Inhalation BID  . docusate sodium  100 mg Oral BID  . finasteride  2.5 mg Oral BID  . flecainide  50 mg Oral BID  . fluticasone  1 spray Each Nare Daily  . levalbuterol  0.63 mg Nebulization Q6H  . levofloxacin  500 mg Oral Daily  . levothyroxine  50 mcg Oral Daily  . losartan  100 mg Oral Daily  . multivitamin with minerals  1 tablet Oral Daily  . [EXPIRED] ondansetron      . pantoprazole  40 mg Oral QHS  . polyethylene glycol  17 g Oral Daily  . tamsulosin  0.4 mg Oral QHS  . Warfarin - Pharmacist Dosing Inpatient   Does not apply q1800  . [DISCONTINUED] levofloxacin (LEVAQUIN) IV  750 mg Intravenous QHS   Infusions:  . 0.9 % NaCl with KCl 20 mEq / L 125 mL/hr at 07/05/12 [id number]    Assessment: 79 yo with history of HTN, pacemaker, Gerd on chronic  coumadin for A-fib.  On Levaquin x 2 days for PNA. Warfarin dose PTA was reported as 3mg  daily except 1.5mg  on Wednesdays  INR therapeutic - 3mg  given yesterday AM as inpt  No reported bleeding  CBC stable  Note patient also on Levaquin Day #5 which could effect INR but has not shown to as of yet  Goal of Therapy:  INR 2-3    Plan:   Repeat Coumadin 3mg  tonight as per home dose  Daily PT/INR   Adrian Saran, PharmD, BCPS Pager [phone] 07/05/2012 8:58 AM

## 2012-07-05 NOTE — Progress Notes (Signed)
CRITICAL VALUE ALERT  Critical value received:  Sodium 119  Date of notification:  07/05/2012  Time of notification:  0755  Critical value read back:yes  Nurse who received alert:  Aldean Baker, RN  MD notified (1st page):  Brigitte Pulse  Time of first page:  0755  MD notified (2nd page):  Time of second page:  Responding MD:  Brigitte Pulse  Time MD responded:  [email or web address] - MD was rounding on patient, no new orders received

## 2012-07-05 NOTE — Progress Notes (Signed)
Subjective: Feels much better.  Nausea/Vomitting improved.  Cough and shortness of breath improved.  Wants to go home- concerned about wife's (has dementia) confusion.  No BM but abdomen less distended.   Objective: Vital signs in last 24 hours: Temp:  [97.5 F (36.4 C)-97.8 F (36.6 C)] 97.5 F (36.4 C) (05/09 0617) Pulse Rate:  [81-107] 81 (05/09 0617) Resp:  [18-20] 18 (05/09 0617) BP: (125-161)/(60-91) 139/60 mmHg (05/09 0617) SpO2:  [95 %-100 %] 96 % (05/09 0740) Weight change:  Last BM Date: 07/01/12  CBG (last 3)  No results found for this basename: GLUCAP,  in the last 72 hours  Intake/Output from previous day: 05/08 0701 - 05/09 0700 In: 840 [P.O.:840] Out: 2650 [Urine:2650] Intake/Output this shift:    General appearance: alert and no distress Eyes: no scleral icterus Throat: oropharynx moist without erythema Resp: clear to auscultation bilaterally Cardio: regular rate and rhythm GI: soft, non-tender; bowel sounds normal; no masses,  no organomegaly Extremities: no clubbing, cyanosis or edema   Lab Results:  Recent Labs  07/04/12 1104 07/04/12 1749  NA 115* 116*  K 3.7 3.9  CL 81* 82*  CO2 24 23  GLUCOSE 163* 115*  BUN 19 19  CREATININE 1.11 1.14  CALCIUM 8.0* 7.9*    Recent Labs  07/03/12 2120  AST 26  ALT 15  ALKPHOS 55  BILITOT 0.6  PROT 6.9  ALBUMIN 3.2*    Recent Labs  07/03/12 2120 07/04/12 0441 07/05/12 0420  WBC 17.4* 12.6* 11.6*  NEUTROABS 14.8*  --   --   HGB 13.1 11.7* 12.1*  HCT 36.6* 32.5* 34.4*  MCV 83.6 83.3 83.7  PLT 169 150 183   Lab Results  Component Value Date   INR 2.37* 07/05/2012   INR 2.69* 07/04/2012   INR 2.55* 07/04/2012   Urine sodium- 78 Urine Osmolality- 477 Serum Osmolality- 247 Urine Legionella antigen- negative  Studies/Results: Dg Chest 1 View  07/04/2012  *RADIOLOGY REPORT*  Clinical Data: Nausea and vomiting, concern for pneumonia  CHEST - 1 VIEW  Comparison: PA views same day 07/03/2012,  chest radiograph 06/30/2012  Findings: ,Compared to chest radiograph of 06/30/2012 there is increased lower lung density.  This could be on the left or right.  IMPRESSION: Concern for lower lobe pneumonia.   Original Report Authenticated By: Suzy Bouchard, M.D.    Dg Chest 2 View  07/04/2012  *RADIOLOGY REPORT*  Clinical Data: Cough, pneumonia  CHEST - 2 VIEW  Comparison: Chest radiograph 07/03/2012  Findings: Left-sided pacemaker overlies stable cardiac silhouette. There is mild interval increase in the bibasilar air space densities greater on the right.  Upper lungs are clear.  No pneumothorax.  IMPRESSION: Basilar pneumonia or aspiration pneumonitis greater on the right.   Original Report Authenticated By: Suzy Bouchard, M.D.    Dg Abd Acute W/chest  07/03/2012  *RADIOLOGY REPORT*  Clinical Data: 79 year old male nausea vomiting abdominal pain.  ACUTE ABDOMEN SERIES (ABDOMEN 2 VIEW & CHEST 1 VIEW)  Comparison: 06/30/2012 and earlier.  Findings: Larger lung volumes.  Stable left chest dual lead cardiac pacemaker.  Stable cardiac size and mediastinal contours. Visualized tracheal air column is within normal limits.  Apical lung scarring is stable.  No pneumothorax or pneumoperitoneum.  Gas in colon at the flexures.  No acute pulmonary opacity identified.  Gas filled colon.  No dilated small bowel identified.  Gas is present in the descending colon which appears more decompressed. Chronic degenerative changes in the lumbar spine.  Chronic  right pelvic phlebolith.  IMPRESSION: 1. Nonobstructed bowel gas pattern, no free air.  Gas throughout much of the colon might reflect ileus or chronic dysmotility. 2.  Larger lung volumes. No acute cardiopulmonary abnormality.   Original Report Authenticated By: Roselyn Reef, M.D.      Medications: Scheduled: . budesonide-formoterol  2 puff Inhalation BID  . docusate sodium  100 mg Oral BID  . finasteride  2.5 mg Oral BID  . flecainide  50 mg Oral BID  .  fluticasone  1 spray Each Nare Daily  . levalbuterol  0.63 mg Nebulization Q6H  . levofloxacin (LEVAQUIN) IV  750 mg Intravenous QHS  . levothyroxine  50 mcg Oral Daily  . losartan  100 mg Oral Daily  . multivitamin with minerals  1 tablet Oral Daily  . pantoprazole  40 mg Oral QHS  . tamsulosin  0.4 mg Oral QHS  . Warfarin - Pharmacist Dosing Inpatient   Does not apply q1800   Continuous: . 0.9 % NaCl with KCl 20 mEq / L 125 mL/hr at 07/05/12 0645    Assessment/Plan: Principal Problem:  1. Acute hyponatremia- severe hyponatremia is likely secondary to volume depletion in the setting of nausea/vomiting, diuretic use. 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 Baseline sodium is a little low (~130).  Slowly improving with hydration.  BMET pending this am.  Will continue NS for 24 hours but anticipate continued improvement given clinical improvement.  Place on free water restriction 1L.  Active Problems:  2. Asthmatic Bronchitis, acute/pneumonia- we'll continue Levaquin (Day #5/7) to cover CAP and aspiration causes. Continue Advair and Xopenex nebulizers (HFA at home).  3. Nausea and vomiting- compounded by his hyponatremia. Improved.  Continue hydration.  Tolerating regular diet  4. Ileus- likely secondary to his electrolyte disturbance. Improving.  Will add Miralax. 5. Atrial fibrillation- Continue Coumadin per pharmacy protocol.  Interaction between Flecainide and Levaquin noted- will obtain EKG to evaluate QTc.  If Ok will complete course of Levaquin. INR is stable with interaction with Levaquin. 6. Hypertension-blood pressure improved. We'll continue home medications with exception of his diuretic and monitor.  DO NOT RESUME MAXZIDE ON DISCHARGE.  7. Disposition- anticipate discharge to home in tomorrow if stable (would like Na to be ~125 prior to discharge).  Will need close follow-up.    LOS: 2 days   Dee Maday,W [name] 07/05/2012, 7:51 AM

## 2012-07-05 NOTE — Progress Notes (Signed)
Patient has been intermittently refusing nebulizer treatments. He complains of jitters and states he feels his breathing is good. He does use xopenex HFA at home on an as needed basis. RT assessment protocol completed. Orders changed accordingly.

## 2012-07-06 LAB — BASIC METABOLIC PANEL
BUN: 17 mg/dL (ref 6–23)
CO2: 23 mEq/L (ref 19–32)
Calcium: 7.7 mg/dL — ABNORMAL LOW (ref 8.4–10.5)
GFR calc non Af Amer: 51 mL/min — ABNORMAL LOW (ref >90)
Glucose, Bld: 99 mg/dL (ref 70–99)
Potassium: 4.4 mEq/L (ref 3.5–5.1)

## 2012-07-06 LAB — CBC
HCT: 33.1 % — ABNORMAL LOW (ref 39.0–52.0)
Hemoglobin: 11.9 g/dL — ABNORMAL LOW (ref 13.0–17.0)
MCH: 30.1 pg (ref 26.0–34.0)
MCHC: 36 g/dL (ref 30.0–36.0)
RBC: 3.95 MIL/uL — ABNORMAL LOW (ref 4.22–5.81)

## 2012-07-06 NOTE — Discharge Summary (Signed)
DISCHARGE SUMMARY  [name]  MR#: [id number]  DOB:1922-10-12  Date of Admission: 07/03/2012 Date of Discharge: 07/06/2012  Attending Physician:Rachael Ferrie A  Patient's HL:[id number] [name], MD  Consults:  none  Discharge Diagnoses: Principal Problem:   Acute hyponatremia Active Problems:   HYPERTENSION   Atrial fibrillation   Nausea and vomiting   Ileus   Acute asthmatic bronchitis   Pneumonia   Discharge Medications:   Medication List    STOP taking these medications       alendronate 70 MG tablet  Commonly known as:  FOSAMAX     beta carotene w/minerals tablet     fish oil-omega-3 fatty acids 1000 MG capsule     methylcellulose oral powder     triamterene-hydrochlorothiazide 37.5-25 MG per tablet  Commonly known as:  MAXZIDE-25      TAKE these medications       budesonide-formoterol 160-4.5 MCG/ACT inhaler  Commonly known as:  SYMBICORT  Inhale 2 puffs into the lungs 2 (two) times daily.     colchicine 0.6 MG tablet  Take 0.6 mg by mouth daily as needed. For gout     docusate sodium 100 MG capsule  Commonly known as:  COLACE  Take 100 mg by mouth 2 (two) times daily as needed for constipation.     finasteride 5 MG tablet  Commonly known as:  PROSCAR  Take 2.5 mg by mouth 2 (two) times daily.     flecainide 100 MG tablet  Commonly known as:  TAMBOCOR  Take 50 mg by mouth 2 (two) times daily.     flurazepam 30 MG capsule  Commonly known as:  DALMANE  Take 30 mg by mouth at bedtime as needed. For sleep     hydrocodone-acetaminophen 5-500 MG per capsule  Commonly known as:  LORCET-HD  Take 1 capsule by mouth as needed.     levalbuterol 45 MCG/ACT inhaler  Commonly known as:  XOPENEX HFA  Inhale 2 puffs into the lungs every 4 (four) hours as needed. Shortness of breath     levothyroxine 50 MCG tablet  Commonly known as:  SYNTHROID, LEVOTHROID  Take 50 mcg by mouth daily.     losartan 100 MG tablet  Commonly known as:  COZAAR   Take 1 tablet by mouth daily.     mometasone 50 MCG/ACT nasal spray  Commonly known as:  NASONEX  Place 2 sprays into the nose daily as needed. For nasal congestion     multivitamin capsule  Take 1 capsule by mouth daily.     omeprazole 20 MG tablet  Commonly known as:  PRILOSEC OTC  Take 20 mg by mouth at bedtime.     ondansetron 4 MG tablet  Commonly known as:  ZOFRAN  Take 4 mg by mouth every 6 (six) hours as needed for nausea.     silodosin 8 MG Caps capsule  Commonly known as:  RAPAFLO  Take 8 mg by mouth every 3 (three) days as needed.     traMADol 50 MG tablet  Commonly known as:  ULTRAM  Take 1 tablet by mouth as needed. pain     Vitamin D3 1000 UNITS Caps  Take 2 capsules by mouth daily.     warfarin 3 MG tablet  Commonly known as:  COUMADIN  Take 1.5-3 mg by mouth at bedtime. Take 1/2 tab on Wednesday and 1 tab all other days        Hospital Procedures: Dg Chest 1 View  07/04/2012  *  RADIOLOGY REPORT*  Clinical Data: Nausea and vomiting, concern for pneumonia  CHEST - 1 VIEW  Comparison: PA views same day 07/03/2012, chest radiograph 06/30/2012  Findings: ,Compared to chest radiograph of 06/30/2012 there is increased lower lung density.  This could be on the left or right.  IMPRESSION: Concern for lower lobe pneumonia.   Original Report Authenticated By: Suzy Bouchard, M.D.    Dg Chest 2 View  07/04/2012  *RADIOLOGY REPORT*  Clinical Data: Cough, pneumonia  CHEST - 2 VIEW  Comparison: Chest radiograph 07/03/2012  Findings: Left-sided pacemaker overlies stable cardiac silhouette. There is mild interval increase in the bibasilar air space densities greater on the right.  Upper lungs are clear.  No pneumothorax.  IMPRESSION: Basilar pneumonia or aspiration pneumonitis greater on the right.   Original Report Authenticated By: Suzy Bouchard, M.D.    Dg Chest 2 View  06/30/2012  *RADIOLOGY REPORT*  Clinical Data: Cough, shortness of breath  CHEST - 2 VIEW  Comparison:  06/15/2010  Findings: Cardiomediastinal silhouette is stable.  No pulmonary edema.  Mild basilar atelectasis.  Dual lead cardiac pacemaker is unchanged in position.  Bony thorax is stable.  IMPRESSION: No pulmonary edema.  Mild basilar atelectasis.   Original Report Authenticated By: Lahoma Crocker, M.D.    Dg Abd Acute W/chest  07/03/2012  *RADIOLOGY REPORT*  Clinical Data: 79 year old male nausea vomiting abdominal pain.  ACUTE ABDOMEN SERIES (ABDOMEN 2 VIEW & CHEST 1 VIEW)  Comparison: 06/30/2012 and earlier.  Findings: Larger lung volumes.  Stable left chest dual lead cardiac pacemaker.  Stable cardiac size and mediastinal contours. Visualized tracheal air column is within normal limits.  Apical lung scarring is stable.  No pneumothorax or pneumoperitoneum.  Gas in colon at the flexures.  No acute pulmonary opacity identified.  Gas filled colon.  No dilated small bowel identified.  Gas is present in the descending colon which appears more decompressed. Chronic degenerative changes in the lumbar spine.  Chronic right pelvic phlebolith.  IMPRESSION: 1. Nonobstructed bowel gas pattern, no free air.  Gas throughout much of the colon might reflect ileus or chronic dysmotility. 2.  Larger lung volumes. No acute cardiopulmonary abnormality.   Original Report Authenticated By: Roselyn Reef, M.D.     History of Present Illness:  Mr. Jardine is an 79 year old white male with a history of asthma, atrial fibrillation on anticoagulation status post pacemaker placement who presented to the emergency department with a complaint of nausea/vomiting and severe weakness. Patient reports that he developed upper respiratory infection symptoms about one week ago consisting of head congestion, drainage, and cough. This worsened over the weekend prompting him to go to urgent care on Sunday 5/4 where chest x-ray was normal. His symptoms were felt to be secondary to allergies and he was discharged home on over-the-counter  antihistamines. He was seen in our office the following day for followup or he reported that his symptoms were worsening with increasing cough. He also reported nausea and vomiting with dry heaves. At the end of the visit he began vomiting in our office. He was prescribed Zofran for nausea and Levaquin for his bronchitis. He was continued on his Symbicort and Xopenex for asthma. Over the past 3 days his symptoms have continued to worsen with persistent dry heaves. He has been unable eat for at least 4 days. His last bowel movement was 3 days ago. He has been able to keep down his medications and has continued taking his diuretic and Coumadin. Given the inability  to maintain his hydration he presented to the emergency department tonight where he was found have a sodium of 112. Acute abdominal series shows possible ileus but no obstruction. He is being admitted for further management.  Hospital Course: Admitted and placed on normal saline infusion as well as 1000cc fluid restriction.  The thiazide diuretic was discontinued. Sodium gradually improved to the mid 120s--baseline for him is 130.  On the morning of discharged patient was mentally clear, ambulating in the halls and taking pos well. He had an excellent understanding of his fluid restriction. From a pulmonary standpoint- no cough or shortness of breath--back to baseline.  He was insisting on discharge to care for his ailing wife. He will follow-up on Monday in the office.  Day of Discharge Exam BP 144/68  Pulse 90  Temp(Src) 97.3 F (36.3 C) (Oral)  Resp 18  Ht 5' 11.5" (1.816 m)  Wt 99.4 kg (219 lb 2.2 oz)  BMI 30.14 kg/m2  SpO2 100%  Physical Exam: General appearance: alert, cooperative and no distress Eyes: no scleral icterus Throat: oropharynx moist without erythema Resp: clear to auscultation bilaterally Cardio: regular rate and rhythm Extremities: no clubbing, cyanosis or edema  Discharge Labs:  Recent Labs  07/05/12 0420  07/06/12 0453  NA 119* 124*  K 4.2 4.4  CL 86* 93*  CO2 25 23  GLUCOSE 91 99  BUN 17 17  CREATININE 1.20 1.23  CALCIUM 8.1* 7.7*    Recent Labs  07/03/12 2120  AST 26  ALT 15  ALKPHOS 55  BILITOT 0.6  PROT 6.9  ALBUMIN 3.2*    Recent Labs  07/03/12 2120  07/05/12 0420 07/06/12 0453  WBC 17.4*  < > 11.6* 11.4*  NEUTROABS 14.8*  --   --   --   HGB 13.1  < > 12.1* 11.9*  HCT 36.6*  < > 34.4* 33.1*  MCV 83.6  < > 83.7 83.8  PLT 169  < > 183 206  < > = values in this interval not displayed. No results found for this basename: CKTOTAL, CKMB, CKMBINDEX, TROPONINI,  in the last 72 hours No results found for this basename: TSH, T4TOTAL, FREET3, T3FREE, THYROIDAB,  in the last 72 hours No results found for this basename: VITAMINB12, FOLATE, FERRITIN, TIBC, IRON, RETICCTPCT,  in the last 72 hours  Discharge instructions:     Discharge Orders   Future Orders Complete By Expires     Diet - low sodium heart healthy  As directed     Comments:      1000cc fluid restiction    Increase activity slowly  As directed        Disposition: home  Follow-up Appts: Follow-up with Dr. Reynaldo Minium at [facility] in [place].  Call for appointment.  Condition on Discharge: improved/stable  Tests Needing Follow-up: bmet monday  Signed: Consetta Cosner A 07/06/2012, 9:14 AM

## 2012-10-27 ENCOUNTER — Emergency Department (HOSPITAL_COMMUNITY)
Admission: EM | Admit: 2012-10-27 | Discharge: 2012-10-27 | Disposition: A | Discharge status: Home / Self Care | Payer: Medicare Other | Attending: Emergency Medicine | Admitting: Emergency Medicine

## 2012-10-27 ENCOUNTER — Encounter (HOSPITAL_COMMUNITY): Payer: Self-pay | Admitting: Emergency Medicine

## 2012-10-27 ENCOUNTER — Emergency Department (HOSPITAL_COMMUNITY): Payer: Medicare Other

## 2012-10-27 DIAGNOSIS — I4891 Unspecified atrial fibrillation: Secondary | ICD-10-CM | POA: Insufficient documentation

## 2012-10-27 DIAGNOSIS — Z87891 Personal history of nicotine dependence: Secondary | ICD-10-CM | POA: Insufficient documentation

## 2012-10-27 DIAGNOSIS — K219 Gastro-esophageal reflux disease without esophagitis: Secondary | ICD-10-CM | POA: Insufficient documentation

## 2012-10-27 DIAGNOSIS — Z950 Presence of cardiac pacemaker: Secondary | ICD-10-CM | POA: Insufficient documentation

## 2012-10-27 DIAGNOSIS — I10 Essential (primary) hypertension: Secondary | ICD-10-CM | POA: Insufficient documentation

## 2012-10-27 DIAGNOSIS — R110 Nausea: Secondary | ICD-10-CM | POA: Insufficient documentation

## 2012-10-27 DIAGNOSIS — N453 Epididymo-orchitis: Secondary | ICD-10-CM | POA: Insufficient documentation

## 2012-10-27 DIAGNOSIS — Z79899 Other long term (current) drug therapy: Secondary | ICD-10-CM | POA: Insufficient documentation

## 2012-10-27 DIAGNOSIS — Z7901 Long term (current) use of anticoagulants: Secondary | ICD-10-CM | POA: Insufficient documentation

## 2012-10-27 DIAGNOSIS — N5089 Other specified disorders of the male genital organs: Secondary | ICD-10-CM | POA: Insufficient documentation

## 2012-10-27 DIAGNOSIS — Z792 Long term (current) use of antibiotics: Secondary | ICD-10-CM | POA: Insufficient documentation

## 2012-10-27 LAB — BASIC METABOLIC PANEL
BUN: 21 mg/dL (ref 6–23)
CO2: 27 mEq/L (ref 19–32)
Calcium: 9.7 mg/dL (ref 8.4–10.5)
Creatinine, Ser: 1.32 mg/dL (ref 0.50–1.35)
GFR calc non Af Amer: 46 mL/min — ABNORMAL LOW (ref >90)
Glucose, Bld: 121 mg/dL — ABNORMAL HIGH (ref 70–99)
Sodium: 130 mEq/L — ABNORMAL LOW (ref 135–145)

## 2012-10-27 LAB — CBC WITH DIFFERENTIAL/PLATELET
Eosinophils Absolute: 0.1 10*3/uL (ref 0.0–0.7)
Eosinophils Relative: 1 % (ref 0–5)
HCT: 37.8 % — ABNORMAL LOW (ref 39.0–52.0)
Hemoglobin: 12.8 g/dL — ABNORMAL LOW (ref 13.0–17.0)
Lymphocytes Relative: 12 % (ref 12–46)
Lymphs Abs: 1.4 10*3/uL (ref 0.7–4.0)
MCH: 29.8 pg (ref 26.0–34.0)
MCV: 88.1 fL (ref 78.0–100.0)
Monocytes Absolute: 1 10*3/uL (ref 0.1–1.0)
Monocytes Relative: 8 % (ref 3–12)
Platelets: 203 10*3/uL (ref 150–400)
RBC: 4.29 MIL/uL (ref 4.22–5.81)
WBC: 11.8 10*3/uL — ABNORMAL HIGH (ref 4.0–10.5)

## 2012-10-27 LAB — URINALYSIS, ROUTINE W REFLEX MICROSCOPIC
Bilirubin Urine: NEGATIVE
Glucose, UA: NEGATIVE mg/dL
Specific Gravity, Urine: 1.016 (ref 1.005–1.030)
Urobilinogen, UA: 0.2 mg/dL (ref 0.0–1.0)

## 2012-10-27 LAB — URINE MICROSCOPIC-ADD ON

## 2012-10-27 MED ORDER — LEVOFLOXACIN IN D5W 500 MG/100ML IV SOLN
500.0000 mg | Freq: Once | INTRAVENOUS | Status: DC
  Filled 2012-10-27: qty 100

## 2012-10-27 MED ORDER — FENTANYL CITRATE 0.05 MG/ML IJ SOLN
50.0000 ug | Freq: Once | INTRAMUSCULAR | Status: AC
  Administered 2012-10-27: 50 ug via INTRAVENOUS
  Filled 2012-10-27: qty 2

## 2012-10-27 MED ORDER — HYDROCODONE-ACETAMINOPHEN 5-325 MG PO TABS: 2.0000 | ORAL_TABLET | ORAL | Status: DC | PRN

## 2012-10-27 MED ORDER — LEVOFLOXACIN 500 MG PO TABS: 500.0000 mg | ORAL_TABLET | Freq: Every day | ORAL | Status: DC

## 2012-10-27 MED ORDER — DEXTROSE 5 % IV SOLN
1.0000 g | Freq: Once | INTRAVENOUS | Status: AC
  Administered 2012-10-27: 1 g via INTRAVENOUS
  Filled 2012-10-27: qty 10

## 2012-10-27 MED ORDER — DEXTROSE 5 % IV SOLN: 1.0000 g | Freq: Once | INTRAVENOUS | Status: DC

## 2012-10-27 MED ORDER — ONDANSETRON HCL 4 MG/2ML IJ SOLN
4.0000 mg | Freq: Once | INTRAMUSCULAR | Status: AC
  Administered 2012-10-27: 4 mg via INTRAVENOUS
  Filled 2012-10-27: qty 2

## 2012-10-27 MED ORDER — ONDANSETRON 8 MG PO TBDP
8.0000 mg | ORAL_TABLET | Freq: Once | ORAL | Status: AC
  Administered 2012-10-27: 8 mg via ORAL
  Filled 2012-10-27: qty 1

## 2012-10-27 NOTE — ED Notes (Signed)
D/C instructions given to family and caregiver at bedside.

## 2012-10-27 NOTE — ED Notes (Signed)
US at bedside

## 2012-10-27 NOTE — ED Notes (Signed)
Pt has had no further vomiting, ok to D/C

## 2012-10-27 NOTE — ED Notes (Addendum)
Pt reports right groin pain, and right testicle swelling. Pt states he is awaiting prostate surgery due to an enlarged prostate. Pt was seen by [facility] on Wednesday. [facility] started the patient on Cefixime due to an UTI.  Pt reports nausea, however denies fever, vomiting, or diarrhea. Pt reports the testicle swelling began this morning, however he reports the selling is getting worse. Pt reports having epididymitis several years ago and reports similar symptoms.  Pt reports being recently prescribed lasix and to be taken "as needed", and taken the medication twice, which today has resulted in abnormally large urinary output for patient.

## 2012-10-27 NOTE — ED Provider Notes (Signed)
CSN: [id number]     Arrival date & time 10/27/12  1700 History   First MD Initiated Contact with Patient 10/27/12 1729     Chief Complaint  Patient presents with  . Groin Swelling  . Groin Pain    HPI Patient comes in with right testicular swelling for last 24 to 36 hours.  Patient has had no fever chills.  Was seen by [facility] on Wednesday and started on antibiotic but seems to have gotten some worse.  Patient has had some nausea but denies fever or vomiting.  Patient has history of epididymitis in the past.  Patient had no history of recent trauma.  Denies diarrhea. Past Medical History  Diagnosis Date  . Hypertension   . Pacemaker-Medtronic   . GERD (gastroesophageal reflux disease)   . Atrial fibrillation      on coumadin    . Atrioventricular block, complete    History reviewed. No pertinent past surgical history. Family History  Problem Relation Age of Onset  . Heart disease Mother   . Rheum arthritis Mother    History  Substance Use Topics  . Smoking status: Former Smoker -- 1.00 packs/day for 2 years    Types: Cigarettes    Quit date: 07/17/1941  . Smokeless tobacco: Never Used  . Alcohol Use: No    Review of Systems All other systems reviewed and are negative Allergies  Review of patient's allergies indicates no known allergies.  Home Medications   Current Outpatient Rx  Name  Route  Sig  Dispense  Refill  . alendronate (FOSAMAX) 70 MG tablet   Oral   Take 70 mg by mouth every 7 (seven) days. Take with a full glass of water on an empty stomach.  Taken on Wednesdays.         . beta carotene w/minerals (OCUVITE) tablet   Oral   Take 1 tablet by mouth at bedtime.         . budesonide-formoterol (SYMBICORT) 160-4.5 MCG/ACT inhaler   Inhalation   Inhale 2 puffs into the lungs 2 (two) times daily.         . calcium-vitamin D (OSCAL WITH D) 500-200 MG-UNIT per tablet   Oral   Take 1 tablet by mouth at bedtime.         . cefixime (SUPRAX)  400 MG tablet   Oral   Take 200 mg by mouth daily. 8 day course of therapy started 10/26/12         . Cholecalciferol (VITAMIN D3) 1000 UNITS CAPS   Oral   Take 2,000 Units by mouth daily.          . colchicine 0.6 MG tablet   Oral   Take 0.6 mg by mouth daily as needed (for gout).          [name] docusate sodium (COLACE) 100 MG capsule   Oral   Take 100 mg by mouth 2 (two) times daily as needed for constipation.         . finasteride (PROSCAR) 5 MG tablet   Oral   Take 5 mg by mouth daily.          . flurazepam (DALMANE) 30 MG capsule   Oral   Take 30 mg by mouth at bedtime as needed for sleep.          . furosemide (LASIX) 20 MG tablet   Oral   Take 20 mg by mouth daily as needed for fluid.         [name]  levothyroxine (SYNTHROID, LEVOTHROID) 75 MCG tablet   Oral   Take 75 mcg by mouth daily before breakfast.         . Linaclotide (LINZESS) 145 MCG CAPS capsule   Oral   Take 145 mcg by mouth daily.         [name] losartan (COZAAR) 100 MG tablet   Oral   Take 100 mg by mouth daily.          . mometasone (NASONEX) 50 MCG/ACT nasal spray   Nasal   Place 2 sprays into the nose daily as needed (for congestion).          . Multiple Vitamin (MULTIVITAMIN WITH MINERALS) TABS tablet   Oral   Take 1 tablet by mouth daily.         [name] omega-3 acid ethyl esters (LOVAZA) 1 G capsule   Oral   Take 1 g by mouth every morning.         [name] omeprazole (PRILOSEC OTC) 20 MG tablet   Oral   Take 20 mg by mouth at bedtime.          . silodosin (RAPAFLO) 8 MG CAPS capsule   Oral   Take 8 mg by mouth at bedtime.          . traMADol (ULTRAM) 50 MG tablet   Oral   Take 50 mg by mouth every 6 (six) hours as needed for pain.          [name] warfarin (COUMADIN) 3 MG tablet   Oral   Take 1.5-3 mg by mouth at bedtime. Take 1/2 tab on Wednesday and 1 tab all other days         . HYDROcodone-acetaminophen (NORCO/VICODIN) 5-325 MG per tablet   Oral   Take 2 tablets by  mouth every 4 (four) hours as needed for pain.   15 tablet   0   . levofloxacin (LEVAQUIN) 500 MG tablet   Oral   Take 1 tablet (500 mg total) by mouth daily.   7 tablet   0    BP 158/85  Pulse 84  Temp(Src) 99 F (37.2 C) (Oral)  Resp 17  SpO2 100% Physical Exam  Nursing note and vitals reviewed. Constitutional: He is oriented to person, place, and time. He appears well-developed and well-nourished. No distress.  HENT:  Head: Normocephalic and atraumatic.  Eyes: Pupils are equal, round, and reactive to light.  Neck: Normal range of motion.  Cardiovascular: Normal rate and intact distal pulses.   Pulmonary/Chest: No respiratory distress.  Abdominal: Normal appearance. He exhibits no distension. Hernia confirmed negative in the right inguinal area.  Genitourinary: Penis normal. Right testis shows swelling and tenderness. Left testis shows tenderness. Left testis shows no swelling.  Musculoskeletal: Normal range of motion.  Neurological: He is alert and oriented to person, place, and time. No cranial nerve deficit.  Skin: Skin is warm and dry. No rash noted.  Psychiatric: He has a normal mood and affect. His behavior is normal.    ED Course  Procedures (including critical care time) Medications  fentaNYL (SUBLIMAZE) injection 50 mcg (50 mcg Intravenous Given 10/27/12 2157)  ondansetron (ZOFRAN) injection 4 mg (4 mg Intravenous Given 10/27/12 2155)  cefTRIAXone (ROCEPHIN) 1 g in dextrose 5 % 50 mL IVPB (0 g Intravenous Stopped 10/27/12 2303)  ondansetron (ZOFRAN-ODT) disintegrating tablet 8 mg (8 mg Oral Given 10/27/12 2314)    Labs Review Labs Reviewed  URINALYSIS, ROUTINE W REFLEX MICROSCOPIC - Abnormal; Notable  for the following:    APPearance TURBID (*)    Hgb urine dipstick MODERATE (*)    Protein, ur 30 (*)    Leukocytes, UA LARGE (*)    All other components within normal limits  CBC WITH DIFFERENTIAL - Abnormal; Notable for the following:    WBC 11.8 (*)     Hemoglobin 12.8 (*)    HCT 37.8 (*)    Neutrophils Relative % 79 (*)    Neutro Abs 9.3 (*)    All other components within normal limits  BASIC METABOLIC PANEL - Abnormal; Notable for the following:    Sodium 130 (*)    Chloride 92 (*)    Glucose, Bld 121 (*)    GFR calc non Af Amer 46 (*)    GFR calc Af Amer 54 (*)    All other components within normal limits  URINE CULTURE  URINE MICROSCOPIC-ADD ON   Imaging Review US Scrotum  10/27/2012   *RADIOLOGY REPORT*  Clinical Data:  Painful swelling right testicle  SCROTAL ULTRASOUND DOPPLER ULTRASOUND OF THE TESTICLES  Technique: Complete ultrasound examination of the testicles, epididymis, and other scrotal structures was performed.  Color and spectral Doppler ultrasound were also utilized to evaluate blood flow to the testicles.  Comparison:  Findings:  Right testis:  44 x 27 x 32 mm with an edematous appearance but no testicular mass.  Left testis:  30 x 12 x 18 mm.  Mildly heterogeneous with mild microlithiasis.  No evidence of mass.  Right epididymis:  Enlarged and hypervascular, measuring 42 x 19 x 32 mm  Left epididymis:  Normal  Hydrocele:  Very small left hydrocele.  Moderate to large right hydrocele.  Varicocele:  present/absent.  Pulsed Doppler interrogation of both testes demonstrates hypervascularity of the right testicle and epididymis.  Normal blood flow to left testicle.  IMPRESSION: Right epididymo-orchitis.   Original Report Authenticated By: Skipper Cliche, M.D.   [place] Art/ven Flow Abd Pelv Doppler  10/27/2012   *RADIOLOGY REPORT*  Clinical Data:  Painful swelling right testicle  SCROTAL ULTRASOUND DOPPLER ULTRASOUND OF THE TESTICLES  Technique: Complete ultrasound examination of the testicles, epididymis, and other scrotal structures was performed.  Color and spectral Doppler ultrasound were also utilized to evaluate blood flow to the testicles.  Comparison:  Findings:  Right testis:  44 x 27 x 32 mm with an edematous appearance but  no testicular mass.  Left testis:  30 x 12 x 18 mm.  Mildly heterogeneous with mild microlithiasis.  No evidence of mass.  Right epididymis:  Enlarged and hypervascular, measuring 42 x 19 x 32 mm  Left epididymis:  Normal  Hydrocele:  Very small left hydrocele.  Moderate to large right hydrocele.  Varicocele:  present/absent.  Pulsed Doppler interrogation of both testes demonstrates hypervascularity of the right testicle and epididymis.  Normal blood flow to left testicle.  IMPRESSION: Right epididymo-orchitis.   Original Report Authenticated By: Skipper Cliche, M.D.    MDM   1. Orchitis and epididymitis    I discussed the case with urology(Dahlstedt) who after review of his cultures recommended patient receive IV Rocephin and that he would call in prescription for antibiotics that his pharmacy.    Dot Lanes, MD 10/27/12 [phone]

## 2012-10-28 LAB — URINE CULTURE
Colony Count: NO GROWTH
Culture: NO GROWTH

## 2012-11-02 ENCOUNTER — Telehealth: Payer: Self-pay | Admitting: Internal Medicine

## 2012-11-02 NOTE — Telephone Encounter (Signed)
Mr Reuther was wondering if he should stop his coumadin while he take ibuprofen for pain from an infection. I told him to continue to take the coumadin.

## 2012-11-04 ENCOUNTER — Inpatient Hospital Stay (HOSPITAL_COMMUNITY)
Admission: AD | Admit: 2012-11-04 | Discharge: 2012-11-08 | DRG: 728 | Disposition: A | Discharge status: Home Health | Payer: Medicare Other | Source: Ambulatory Visit | Attending: Internal Medicine | Admitting: Internal Medicine

## 2012-11-04 ENCOUNTER — Inpatient Hospital Stay (HOSPITAL_COMMUNITY): Payer: Medicare Other

## 2012-11-04 DIAGNOSIS — G8929 Other chronic pain: Secondary | ICD-10-CM | POA: Diagnosis present

## 2012-11-04 DIAGNOSIS — I5032 Chronic diastolic (congestive) heart failure: Secondary | ICD-10-CM | POA: Diagnosis present

## 2012-11-04 DIAGNOSIS — Z6828 Body mass index (BMI) 28.0-28.9, adult: Secondary | ICD-10-CM

## 2012-11-04 DIAGNOSIS — R197 Diarrhea, unspecified: Secondary | ICD-10-CM | POA: Diagnosis present

## 2012-11-04 DIAGNOSIS — N179 Acute kidney failure, unspecified: Secondary | ICD-10-CM | POA: Diagnosis present

## 2012-11-04 DIAGNOSIS — N433 Hydrocele, unspecified: Secondary | ICD-10-CM | POA: Diagnosis present

## 2012-11-04 DIAGNOSIS — N138 Other obstructive and reflux uropathy: Secondary | ICD-10-CM | POA: Diagnosis present

## 2012-11-04 DIAGNOSIS — I509 Heart failure, unspecified: Secondary | ICD-10-CM | POA: Diagnosis present

## 2012-11-04 DIAGNOSIS — I4891 Unspecified atrial fibrillation: Secondary | ICD-10-CM | POA: Diagnosis present

## 2012-11-04 DIAGNOSIS — M109 Gout, unspecified: Secondary | ICD-10-CM | POA: Diagnosis present

## 2012-11-04 DIAGNOSIS — R319 Hematuria, unspecified: Secondary | ICD-10-CM | POA: Diagnosis present

## 2012-11-04 DIAGNOSIS — J45909 Unspecified asthma, uncomplicated: Secondary | ICD-10-CM | POA: Diagnosis present

## 2012-11-04 DIAGNOSIS — N452 Orchitis: Secondary | ICD-10-CM | POA: Diagnosis present

## 2012-11-04 DIAGNOSIS — Z87891 Personal history of nicotine dependence: Secondary | ICD-10-CM

## 2012-11-04 DIAGNOSIS — N39498 Other specified urinary incontinence: Secondary | ICD-10-CM | POA: Diagnosis present

## 2012-11-04 DIAGNOSIS — N500 Atrophy of testis: Secondary | ICD-10-CM | POA: Diagnosis present

## 2012-11-04 DIAGNOSIS — N5089 Other specified disorders of the male genital organs: Secondary | ICD-10-CM | POA: Diagnosis present

## 2012-11-04 DIAGNOSIS — T502X5A Adverse effect of carbonic-anhydrase inhibitors, benzothiadiazides and other diuretics, initial encounter: Secondary | ICD-10-CM | POA: Diagnosis present

## 2012-11-04 DIAGNOSIS — R300 Dysuria: Secondary | ICD-10-CM | POA: Diagnosis present

## 2012-11-04 DIAGNOSIS — R809 Proteinuria, unspecified: Secondary | ICD-10-CM | POA: Diagnosis present

## 2012-11-04 DIAGNOSIS — R000 Tachycardia, unspecified: Secondary | ICD-10-CM | POA: Diagnosis present

## 2012-11-04 DIAGNOSIS — R3911 Hesitancy of micturition: Secondary | ICD-10-CM | POA: Diagnosis present

## 2012-11-04 DIAGNOSIS — E871 Hypo-osmolality and hyponatremia: Secondary | ICD-10-CM | POA: Diagnosis present

## 2012-11-04 DIAGNOSIS — R7303 Prediabetes: Secondary | ICD-10-CM | POA: Diagnosis present

## 2012-11-04 DIAGNOSIS — E119 Type 2 diabetes mellitus without complications: Secondary | ICD-10-CM | POA: Diagnosis present

## 2012-11-04 DIAGNOSIS — Z950 Presence of cardiac pacemaker: Secondary | ICD-10-CM

## 2012-11-04 DIAGNOSIS — Z7901 Long term (current) use of anticoagulants: Secondary | ICD-10-CM

## 2012-11-04 DIAGNOSIS — M545 Low back pain, unspecified: Secondary | ICD-10-CM | POA: Diagnosis present

## 2012-11-04 DIAGNOSIS — I10 Essential (primary) hypertension: Secondary | ICD-10-CM | POA: Diagnosis present

## 2012-11-04 DIAGNOSIS — E039 Hypothyroidism, unspecified: Secondary | ICD-10-CM | POA: Diagnosis present

## 2012-11-04 DIAGNOSIS — N419 Inflammatory disease of prostate, unspecified: Secondary | ICD-10-CM | POA: Diagnosis present

## 2012-11-04 DIAGNOSIS — N401 Enlarged prostate with lower urinary tract symptoms: Secondary | ICD-10-CM | POA: Diagnosis present

## 2012-11-04 DIAGNOSIS — M810 Age-related osteoporosis without current pathological fracture: Secondary | ICD-10-CM | POA: Diagnosis present

## 2012-11-04 DIAGNOSIS — N453 Epididymo-orchitis: Principal | ICD-10-CM | POA: Diagnosis present

## 2012-11-04 DIAGNOSIS — E669 Obesity, unspecified: Secondary | ICD-10-CM | POA: Diagnosis present

## 2012-11-04 DIAGNOSIS — Z79899 Other long term (current) drug therapy: Secondary | ICD-10-CM

## 2012-11-04 DIAGNOSIS — K219 Gastro-esophageal reflux disease without esophagitis: Secondary | ICD-10-CM | POA: Diagnosis present

## 2012-11-04 DIAGNOSIS — E236 Other disorders of pituitary gland: Secondary | ICD-10-CM | POA: Diagnosis present

## 2012-11-04 DIAGNOSIS — I5042 Chronic combined systolic (congestive) and diastolic (congestive) heart failure: Secondary | ICD-10-CM | POA: Diagnosis present

## 2012-11-04 HISTORY — DX: Hypothyroidism, unspecified: E03.9

## 2012-11-04 HISTORY — DX: Hypo-osmolality and hyponatremia: E87.1

## 2012-11-04 HISTORY — DX: Chronic sinusitis, unspecified: J32.9

## 2012-11-04 HISTORY — DX: Chronic combined systolic (congestive) and diastolic (congestive) heart failure: I50.42

## 2012-11-04 HISTORY — DX: Benign prostatic hyperplasia without lower urinary tract symptoms: N40.0

## 2012-11-04 HISTORY — DX: Gout, unspecified: M10.9

## 2012-11-04 HISTORY — DX: Age-related osteoporosis without current pathological fracture: M81.0

## 2012-11-04 HISTORY — DX: Obesity, unspecified: E66.9

## 2012-11-04 HISTORY — DX: Unspecified thoracic, thoracolumbar and lumbosacral intervertebral disc disorder: M51.9

## 2012-11-04 LAB — COMPREHENSIVE METABOLIC PANEL
ALT: 35 U/L (ref 0–53)
AST: 31 U/L (ref 0–37)
Alkaline Phosphatase: 96 U/L (ref 39–117)
CO2: 26 mEq/L (ref 19–32)
Calcium: 8.6 mg/dL (ref 8.4–10.5)
GFR calc Af Amer: 45 mL/min — ABNORMAL LOW (ref >90)
GFR calc non Af Amer: 39 mL/min — ABNORMAL LOW (ref >90)
Glucose, Bld: 168 mg/dL — ABNORMAL HIGH (ref 70–99)
Potassium: 4.2 mEq/L (ref 3.5–5.1)
Sodium: 124 mEq/L — ABNORMAL LOW (ref 135–145)
Total Protein: 6.9 g/dL (ref 6.0–8.3)

## 2012-11-04 LAB — GLUCOSE, CAPILLARY: Glucose-Capillary: 114 mg/dL — ABNORMAL HIGH (ref 70–99)

## 2012-11-04 LAB — URINALYSIS, ROUTINE W REFLEX MICROSCOPIC
Bilirubin Urine: NEGATIVE
Glucose, UA: NEGATIVE mg/dL
Ketones, ur: NEGATIVE mg/dL
Protein, ur: NEGATIVE mg/dL
pH: 7 (ref 5.0–8.0)

## 2012-11-04 LAB — CBC
HCT: 33.8 % — ABNORMAL LOW (ref 39.0–52.0)
MCHC: 35.2 g/dL (ref 30.0–36.0)
Platelets: 230 10*3/uL (ref 150–400)
RDW: 12.9 % (ref 11.5–15.5)
WBC: 12.6 10*3/uL — ABNORMAL HIGH (ref 4.0–10.5)

## 2012-11-04 MED ORDER — LEVOTHYROXINE SODIUM 75 MCG PO TABS
75.0000 ug | ORAL_TABLET | Freq: Every day | ORAL | Status: DC
  Administered 2012-11-05 – 2012-11-08 (×4): 75 ug via ORAL
  Filled 2012-11-04 (×5): qty 1

## 2012-11-04 MED ORDER — ONDANSETRON HCL 4 MG/2ML IJ SOLN: 4.0000 mg | Freq: Four times a day (QID) | INTRAMUSCULAR | Status: DC | PRN

## 2012-11-04 MED ORDER — DOCUSATE SODIUM 100 MG PO CAPS
100.0000 mg | ORAL_CAPSULE | Freq: Two times a day (BID) | ORAL | Status: DC | PRN
  Filled 2012-11-04: qty 1

## 2012-11-04 MED ORDER — BUDESONIDE-FORMOTEROL FUMARATE 160-4.5 MCG/ACT IN AERO
2.0000 | INHALATION_SPRAY | Freq: Two times a day (BID) | RESPIRATORY_TRACT | Status: DC
  Administered 2012-11-05 – 2012-11-07 (×6): 2 via RESPIRATORY_TRACT
  Filled 2012-11-04: qty 6

## 2012-11-04 MED ORDER — FINASTERIDE 5 MG PO TABS
5.0000 mg | ORAL_TABLET | Freq: Every day | ORAL | Status: DC
  Administered 2012-11-05 – 2012-11-08 (×4): 5 mg via ORAL
  Filled 2012-11-04 (×4): qty 1

## 2012-11-04 MED ORDER — FUROSEMIDE 20 MG PO TABS
20.0000 mg | ORAL_TABLET | Freq: Every day | ORAL | Status: DC
  Filled 2012-11-04: qty 1

## 2012-11-04 MED ORDER — OMEGA-3-ACID ETHYL ESTERS 1 G PO CAPS
1.0000 g | ORAL_CAPSULE | Freq: Every morning | ORAL | Status: DC
  Administered 2012-11-05 – 2012-11-08 (×4): 1 g via ORAL
  Filled 2012-11-04 (×4): qty 1

## 2012-11-04 MED ORDER — TAMSULOSIN HCL 0.4 MG PO CAPS
0.4000 mg | ORAL_CAPSULE | Freq: Every day | ORAL | Status: DC
  Administered 2012-11-04 – 2012-11-07 (×3): 0.4 mg via ORAL
  Filled 2012-11-04 (×5): qty 1

## 2012-11-04 MED ORDER — LINACLOTIDE 145 MCG PO CAPS
145.0000 ug | ORAL_CAPSULE | Freq: Every day | ORAL | Status: DC
  Administered 2012-11-05 – 2012-11-08 (×4): 145 ug via ORAL
  Filled 2012-11-04 (×4): qty 1

## 2012-11-04 MED ORDER — DEXTROSE 5 % IV SOLN
1.0000 g | Freq: Two times a day (BID) | INTRAVENOUS | Status: DC
  Administered 2012-11-04 – 2012-11-08 (×8): 1 g via INTRAVENOUS
  Filled 2012-11-04 (×9): qty 1

## 2012-11-04 MED ORDER — TEMAZEPAM 15 MG PO CAPS: 15.0000 mg | ORAL_CAPSULE | Freq: Every evening | ORAL | Status: DC | PRN

## 2012-11-04 MED ORDER — ONDANSETRON HCL 4 MG PO TABS: 4.0000 mg | ORAL_TABLET | Freq: Four times a day (QID) | ORAL | Status: DC | PRN

## 2012-11-04 MED ORDER — VITAMIN D3 25 MCG (1000 UT) PO CAPS: 2000.0000 [IU] | ORAL_CAPSULE | Freq: Every day | ORAL | Status: DC

## 2012-11-04 MED ORDER — FLUTICASONE PROPIONATE 50 MCG/ACT NA SUSP
1.0000 | Freq: Every day | NASAL | Status: DC
  Administered 2012-11-05 – 2012-11-07 (×5): 1 via NASAL
  Filled 2012-11-04 (×2): qty 16

## 2012-11-04 MED ORDER — OCUVITE PO TABS
1.0000 | ORAL_TABLET | Freq: Every day | ORAL | Status: DC
  Administered 2012-11-04 – 2012-11-07 (×4): 1 via ORAL
  Filled 2012-11-04 (×5): qty 1

## 2012-11-04 MED ORDER — TRAMADOL HCL 50 MG PO TABS: 50.0000 mg | ORAL_TABLET | Freq: Four times a day (QID) | ORAL | Status: DC | PRN

## 2012-11-04 MED ORDER — VITAMIN D3 25 MCG (1000 UNIT) PO TABS
2000.0000 [IU] | ORAL_TABLET | Freq: Every day | ORAL | Status: DC
  Administered 2012-11-05 – 2012-11-08 (×4): 2000 [IU] via ORAL
  Filled 2012-11-04 (×4): qty 2

## 2012-11-04 MED ORDER — ADULT MULTIVITAMIN W/MINERALS CH
1.0000 | ORAL_TABLET | Freq: Every day | ORAL | Status: DC
  Administered 2012-11-05 – 2012-11-08 (×4): 1 via ORAL
  Filled 2012-11-04 (×4): qty 1

## 2012-11-04 MED ORDER — LOSARTAN POTASSIUM 50 MG PO TABS
100.0000 mg | ORAL_TABLET | Freq: Every day | ORAL | Status: DC
  Administered 2012-11-05 – 2012-11-08 (×4): 100 mg via ORAL
  Filled 2012-11-04 (×4): qty 2

## 2012-11-04 MED ORDER — CALCIUM CARBONATE-VITAMIN D 500-200 MG-UNIT PO TABS
1.0000 | ORAL_TABLET | Freq: Every day | ORAL | Status: DC
  Administered 2012-11-04 – 2012-11-07 (×4): 1 via ORAL
  Filled 2012-11-04 (×5): qty 1

## 2012-11-04 MED ORDER — ALUM & MAG HYDROXIDE-SIMETH 200-200-20 MG/5ML PO SUSP: 30.0000 mL | Freq: Four times a day (QID) | ORAL | Status: DC | PRN

## 2012-11-04 MED ORDER — PANTOPRAZOLE SODIUM 40 MG PO TBEC
40.0000 mg | DELAYED_RELEASE_TABLET | Freq: Every day | ORAL | Status: DC
  Administered 2012-11-05 – 2012-11-08 (×4): 40 mg via ORAL
  Filled 2012-11-04 (×4): qty 1

## 2012-11-04 MED ORDER — OMEPRAZOLE MAGNESIUM 20 MG PO TBEC: 20.0000 mg | DELAYED_RELEASE_TABLET | Freq: Every day | ORAL | Status: DC

## 2012-11-04 MED ORDER — HYDROCODONE-ACETAMINOPHEN 5-325 MG PO TABS
2.0000 | ORAL_TABLET | ORAL | Status: DC | PRN
  Administered 2012-11-04: 2 via ORAL
  Administered 2012-11-05: 1 via ORAL
  Administered 2012-11-05: 2 via ORAL
  Administered 2012-11-06: 1 via ORAL
  Administered 2012-11-06 – 2012-11-07 (×4): 2 via ORAL
  Filled 2012-11-04 (×2): qty 1
  Filled 2012-11-04 (×2): qty 2
  Filled 2012-11-04 (×2): qty 1
  Filled 2012-11-04 (×2): qty 2
  Filled 2012-11-04: qty 1

## 2012-11-04 MED ORDER — COLCHICINE 0.6 MG PO TABS
0.6000 mg | ORAL_TABLET | Freq: Every day | ORAL | Status: DC | PRN
  Filled 2012-11-04: qty 1

## 2012-11-04 NOTE — Progress Notes (Addendum)
ANTICOAGULATION CONSULT NOTE - Initial Consult  Pharmacy Consult for warfarin Indication: atrial fibrillation  No Known Allergies  Patient Measurements: Height: 6' (182.9 cm) Weight: 210 lb (95.255 kg) IBW/kg (Calculated) : 77.6 Heparin Dosing Weight:   Vital Signs: Temp: 97.9 F (36.6 C) (09/08 1837) Temp src: Oral (09/08 1837) BP: 144/69 mmHg (09/08 1837) Pulse Rate: 88 (09/08 1837)  Labs: No results found for this basename: HGB, HCT, PLT, APTT, LABPROT, INR, HEPARINUNFRC, CREATININE, CKTOTAL, CKMB, TROPONINI,  in the last 72 hours  Estimated Creatinine Clearance: 46.3 ml/min (by C-G formula based on Cr of 1.32).   Medical History: Past Medical History  Diagnosis Date  . Hypertension   . Pacemaker-Medtronic   . GERD (gastroesophageal reflux disease)   . Atrial fibrillation      on coumadin    . Atrioventricular block, complete     Medications:  Scheduled:  . beta carotene w/minerals  1 tablet Oral QHS  . budesonide-formoterol  2 puff Inhalation BID  . calcium-vitamin D  1 tablet Oral QHS  . ceFEPime (MAXIPIME) IV  1 g Intravenous Q12H  . [START ON 11/05/2012] cholecalciferol  2,000 Units Oral Daily  . [START ON 11/05/2012] finasteride  5 mg Oral Daily  . [START ON 11/05/2012] fluticasone  1 spray Each Nare Daily  . [START ON 11/05/2012] furosemide  20 mg Oral Daily  . [START ON 11/05/2012] levothyroxine  75 mcg Oral QAC breakfast  . [START ON 11/05/2012] Linaclotide  145 mcg Oral Daily  . [START ON 11/05/2012] losartan  100 mg Oral Daily  . [START ON 11/05/2012] multivitamin with minerals  1 tablet Oral Daily  . [START ON 11/05/2012] omega-3 acid ethyl esters  1 g Oral q morning - 10a  . [START ON 11/05/2012] pantoprazole  40 mg Oral Daily  . tamsulosin  0.4 mg Oral QPC supper   Infusions:   PRN: alum & mag hydroxide-simeth, colchicine, docusate sodium, HYDROcodone-acetaminophen, ondansetron (ZOFRAN) IV, ondansetron, temazepam, traMADol  Assessment: 79 yo M with Warfarin  ordered per pharmacy for atrial fibrillation. Goal of Therapy:  INR 2-3 Monitor platelets by anticoagulation protocol: Yes   Plan:  The INR = 3.16(supratherapeutic) No warfarin tonight. Will recheck INR in AM.  Gypsy Decant 11/04/2012,7:47 PM

## 2012-11-04 NOTE — H&P (Signed)
Vital Signs  Entered weight:  218  lbs., Calculated Weight: 218 lbs., ( 98.88 kg) Height: 69.25 in., ( 175.90 cm) Temperature: 97.5 deg F, Pulse rate: 100 Pulse rhythm: regular  Blood Pressure #1: 146 / 82 mm Hg    BMI: 31.96 BSA: 2.15 Wt Chg: 4 lbs since 09/24/2012  Vitals entered by: Carrolyn Leigh  CNA on November 04, 2012 4:39 PM        History of Present Illness  History from: patient Reason for visit: See chief complaint Chief Complaint: pt has been having scrotal swelling x one week History of Present Illness: Has had ongoing GU issues- has been on oral antibiotics for recurrent prostatitis.  Considering robotic prostatectomy.   Went to ER on 8/31 for acute onset right testicular pain and swelling- received dose of Rocephin.  Discharged on cefdinir.  Has called urologist who recommended NSAIDs which he took with minimal relief.  Was seen today by Cardiologist who recommended admission due to severity of symptoms.  Urinating OK.  No dysuria.     Recent EF 45%- has been taking maxzide because he is out of furosemide that was prescribed by [name] due to prior hyponatremia.  No edema.  +DOE.  HR has been running 110s.     Review of Systems  General:       Denies fevers, chills, sweats, weight loss.   Cardiovascular:       Complains of dyspnea on exertion.        Denies chest pains, orthopnea, peripheral edema.   Respiratory:       Denies cough.   Gastrointestinal:       Denies diarrhea, constipation, heartburn.   Genitourinary:       Complains of see HPI.   Musculoskeletal:       Denies joint pain.   Skin:       Denies rash.   Heme/Lymphatic:       Denies bleeding.    Past History Past Medical History (reviewed - no changes required): Asthma Afib on coumadin HTN IFG/borderline DM2 Microalbuminuria Hypothyroid Hyponatremia secondary to Maxzide BPH Obesity Long-term anticoagulant therapy Mitral valve d/o SVT Chronic LBP Chronic  sinusitis Gout Syncope Osteoporosis BPH Hyperplastic polyps (5/03) Surgical History (reviewed - no changes required): Pacemaker 2005 Ablation 1990 Inguinal hernia repair 1969 Tympan. tubes Family History (reviewed - no changes required): Father deceased at 19 (accident, PUD, ETOHism). Mother deceased at 12 (heart dz). Sister- deceased blood clot. Son & daughter healthy. Social History (reviewed - no changes required): Re-Married [name] has moderate demetia).  1st wife deceased.  1 son, 1 daughter, 5 grandchildren. High school education.  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 Remote tobacco use, [place].  Family History Summary:     Reviewed history Last on 03/15/2012 and no changes required:11/04/2012 Sister (full) - Has Family History of Other Medical Problems - PE - Entered On: 08/13/2012 Father Ailene Ravel.) - Has Family History of Alcoholism - Entered On: 11/04/2012  General Comments - FH: Father deceased at 88 (accident, PUD, ETOHism). Mother deceased at 70 (heart dz). Sister- deceased blood clot. Son & daughter healthy.  Social History:    Reviewed history from 08/13/2012 and no changes required:       Re-Married [name] has moderate demetia).  1st wife deceased.  1 son, 1 daughter, 5 grandchildren.       High school education.  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       Remote tobacco use, [place].   Physical  Exam  General appearance: fatigued  Eyes  External: conjunctivae and lids normal Pupils: equal, round, reactive to light and accommodation  Ears, Nose and Throat  External ears: normal, no lesions or deformities External nose: normal, no lesions or deformities Hearing: bilateral hearing aids Nasal: mucosa, septum, and turbinates normal Dental: good dentition Pharynx: tongue normal, protrudes mid line,  posterior pharynx without erythema or exudate  Neck  Neck: supple, no masses, trachea midline Thyroid: no nodules, masses, tenderness, or enlargement  Respiratory   Respiratory effort: no intercostal retractions or use of accessory muscles Auscultation: no rales, rhonchi, or wheezes  Cardiovascular  Auscultation: S1, S2, no murmur, rub, or gallop Carotid arteries: pulses 2+, symmetric, no bruits Pedal pulses: pulses 2+, symmetric Periph. circulation: no cyanosis, clubbing; trace bilateral lower extremity edema  Gastrointestinal  Abdomen: soft, non-tender, no masses, bowel sounds normal Liver and spleen: no enlargement or nodularity  Genitourinary  Scrotum: massive scrotal edema with peau d'orange induration; right testicle swollen, firm, tender Penis: swelling of glans  Lymphatic  Neck: no cervical adenopathy  Mental Status Exam  Judgment, insight: intact Mood and affect: Normal Mood   Impression & Recommendations:  Problem # 1:  Orchitis/epididymitis NOS (ICD-604.90) (ICD10-N45.3) He does not have any other edema so I believe this is primarily infectious orchitis.  He will require admission for IV antibiotics, elevation and urologic evaluation.  Obtain testicular ultrasound for further evaluation.  Check albumen.  Problem # 2:  FIBRILLATION, ATRIAL (ICD-427.31) (ICD10-I48.91) Heart rate has been increased contributing to mild CHF symptoms.  Will resume low dose lasix which may help with scrotal edema.  Continue coumadin per pharmacy protocol. His updated medication list for this problem includes:    Flecainide Acetate 100 Mg Tabs (Flecainide acetate) .[name]... 1/2  tab po bid    Coumadin 3 Mg Tabs (Warfarin sodium) .[name]... Take 3 mg for 6 days and then 1/2 for one day (wednesdays)  or as directed   Problem # 3:  HYPONATREMIA (ICD-276.1) (ICD10-E87.1) Check BMET due to prior hyponatremia related to diuretic use.    Problem # 4:  BENIGN PROSTATIC HYPERTROPHY, WITH OBSTRUCTION (ICD-600.01) (ICD10-N40.1) Anticipating robotic prostatectomy soon.    Complete Medication List: 1)  Levothyroxine Sodium 50 Mcg Tabs (Levothyroxine sodium) .... One  po every day 2)  Zofran 4 Mg Tabs (Ondansetron hcl) .[name].. 1 pill every 6 hours as needed for nausea 3)  Flurazepam Hcl 30 Mg Caps (Flurazepam hcl) .[name].. 1 pill at bedtime 4)  Freestyle Lancets Misc (Lancets) .... Use to self-monitor blood glucose daily and as directed 5)  Freestyle Lite Test Strp (Glucose blood) .... Use to self-monitor blood glucose daily and as directed 6)  Tramadol Hcl 50 Mg Tabs (Tramadol hcl) .[name].. 1 pill every 8 hours as needed for pain 7)  Colchicine 0.6 Mg Tabs (Colchicine) .... Prn 8)  Finasteride 5 Mg Tabs (Finasteride) .... Take one tablet by mouth every day 9)  Flecainide Acetate 100 Mg Tabs (Flecainide acetate) .... 1/2  tab po bid 10)  Prilosec 20 Mg Cpdr (Omeprazole) .[name].. 1 cap po qd 11)  Coumadin 3 Mg Tabs (Warfarin sodium) .... Take 3 mg for 6 days and then 1/2 for one day (wednesdays)  or as directed 12)  Symbicort 160-4.5 Mcg/act Aero (Budesonide-formoterol fumarate) .... One puff twice daily 13)  Xopenex Hfa 45 Mcg/act Aero (Levalbuterol tartrate) .... Prn 14)  Colace 100 Mg Caps (Docusate sodium) .... Two po qd 15)  Vitamin D 1000 Unit Caps (Cholecalciferol) .... Take one  capsule by mouth every day 16)  Losartan Potassium 100 Mg Tabs (Losartan potassium) .... Take one tablet daily. 17)  Rapaflo 8 Mg Caps (Silodosin) .... One po every day 18)  Hydrocodone-acetaminophen 5-500 Mg Tabs (Hydrocodone-acetaminophen) .... One po as needed every 4-6 hrs for pain 19)  Nasonex 50 Mcg/act Susp (Mometasone furoate) .... Use as directed. 20)  Proctosol Hc 2.5 % Crea (Hydrocortisone) .... Apply per rectum twice daily for 14 days as needed.   Comments: patient admitted ]    Electronically signed by Link Snuffer MD on 11/04/2012 at 5:39 PM  Addendum- discussed with Dr. Junious Silk- his recent urine cultures in their office grew Pseudomonas sensitive to Zosyn, cefepime, ceftaz, imipenem, and gentamicin.  He was initially treated with Suprax and changed to Cefdinir after  his ER visit.  We'll change his antibiotic course to cefepime for better pseudomonas coverage.  Dr. Junious Silk does feel that this is related to his massive BPH (greater than 400 g), which will benefit from prostatectomy.  We'll need to consider this once he is stable.

## 2012-11-05 ENCOUNTER — Encounter (HOSPITAL_COMMUNITY): Payer: Self-pay | Admitting: *Deleted

## 2012-11-05 LAB — BASIC METABOLIC PANEL
BUN: 27 mg/dL — ABNORMAL HIGH (ref 6–23)
CO2: 26 mEq/L (ref 19–32)
Calcium: 8.5 mg/dL (ref 8.4–10.5)
Chloride: 88 mEq/L — ABNORMAL LOW (ref 96–112)
Creatinine, Ser: 1.45 mg/dL — ABNORMAL HIGH (ref 0.50–1.35)

## 2012-11-05 LAB — CBC
Hemoglobin: 11.7 g/dL — ABNORMAL LOW (ref 13.0–17.0)
MCH: 30.5 pg (ref 26.0–34.0)
Platelets: 231 10*3/uL (ref 150–400)
RBC: 3.83 MIL/uL — ABNORMAL LOW (ref 4.22–5.81)

## 2012-11-05 LAB — PROTIME-INR
INR: 3.24 — ABNORMAL HIGH (ref 0.00–1.49)
Prothrombin Time: 31.9 seconds — ABNORMAL HIGH (ref 11.6–15.2)

## 2012-11-05 MED ORDER — INFLUENZA VAC SPLIT QUAD 0.5 ML IM SUSP
0.5000 mL | INTRAMUSCULAR | Status: AC
  Administered 2012-11-06: 0.5 mL via INTRAMUSCULAR
  Filled 2012-11-05 (×2): qty 0.5

## 2012-11-05 MED ORDER — WARFARIN - PHARMACIST DOSING INPATIENT
Freq: Every day | Status: DC
  Administered 2012-11-07: 18:00:00

## 2012-11-05 NOTE — Plan of Care (Signed)
Problem: Phase I Progression Outcomes Goal: Voiding-avoid urinary catheter unless indicated Outcome: Progressing Patient c/o feeling need to void and unable then laid back down in bed and was incontinent of large amount of urine. Bladder scan done by Amber NT and recorded as zero.

## 2012-11-05 NOTE — Consult Note (Signed)
Consult: Right epididymo-orchitis Requested by : Dr. Brigitte Pulse  History of Present Illness: [name] has been dealing with right epididymo-orchitis for the past week. His urine Cx grew pseudomonas and he was started on abx. He went to ER for pain and repeat urine Cx was negative however the testicular swelling and pain have not improved. Also the patient has malaise and was tachycardic at [name] office yesterday.   Pt was admitted and started on IV Maxipime. He already notes improvement in the pain. He looks better today. No fevers. He's voiding with a good flow but having some functional incontinence due to the pain and peno-scrotal edema. He has some dysuria.   He's had issues with hematuria, retention and infections for past 15 yrs or so. We discussed a significant risk of mortality with his surgery (simple prostatectomy), but again he reiterated today that he wants the surgery and has "lived a long life. I'm 79 years old". His biggest concerned he said was leaving his lovely wife behind. We discussed alternatives such as SP tube. He meets with Dr. Tresa Moore my associate later this month.   Past Medical History  Diagnosis Date  . Hypertension   . Pacemaker-Medtr[employer]   . GERD (gastroesophageal reflux disease)   . Atrial fibrillation      on coumadin    . Atrioventricular block, complete   . Diabetes mellitus without complication   . Gout   . BPH (benign prostatic hyperplasia)   . Hypothyroid   . Sinusitis   . Osteoporosis   . Hyponatremia     from diuretic use    Past Surgical History  Procedure Laterality Date  . Pacemaker insertion    . Ablation  1990  . Inguinal hernia repair  1969    Home Medications:  Prescriptions prior to admission  Medication Sig Dispense Refill  . alendronate (FOSAMAX) 70 MG tablet Take 70 mg by mouth every 7 (seven) days. Take with a full glass of water on an empty stomach.  Taken on Wednesdays.      . beta carotene w/minerals (OCUVITE) tablet Take 1  tablet by mouth at bedtime.      . budesonide-formoterol (SYMBICORT) 160-4.5 MCG/ACT inhaler Inhale 2 puffs into the lungs 2 (two) times daily.      . calcium-vitamin D (OSCAL WITH D) 500-200 MG-UNIT per tablet Take 1 tablet by mouth at bedtime.      . Cholecalciferol (VITAMIN D3) 1000 UNITS CAPS Take 2,000 Units by mouth daily.       [name] docusate sodium (COLACE) 100 MG capsule Take 100 mg by mouth 2 (two) times daily as needed for constipation.      [name] esomeprazole (NEXIUM) 20 MG capsule Take 20 mg by mouth at bedtime.      . finasteride (PROSCAR) 5 MG tablet Take 5 mg by mouth daily.       . flurazepam (DALMANE) 30 MG capsule Take 30 mg by mouth at bedtime as needed for sleep.       . furosemide (LASIX) 20 MG tablet Take 20 mg by mouth daily as needed for fluid.      [name] HYDROcodone-acetaminophen (NORCO/VICODIN) 5-325 MG per tablet Take 2 tablets by mouth every 4 (four) hours as needed for pain.  15 tablet  0  . ibuprofen (ADVIL,MOTRIN) 200 MG tablet Take 400 mg by mouth every 6 (six) hours as needed for pain (pain).      [name] levothyroxine (SYNTHROID, LEVOTHROID) 75 MCG tablet Take 75 mcg by  mouth daily before breakfast.      . Linaclotide (LINZESS) 145 MCG CAPS capsule Take 145 mcg by mouth daily.      [name] losartan (COZAAR) 100 MG tablet Take 100 mg by mouth daily.       . mometasone (NASONEX) 50 MCG/ACT nasal spray Place 2 sprays into the nose daily as needed (for congestion).       . Multiple Vitamin (MULTIVITAMIN WITH MINERALS) TABS tablet Take 1 tablet by mouth daily.      [name] omega-3 acid ethyl esters (LOVAZA) 1 G capsule Take 1 g by mouth every morning.      . silodosin (RAPAFLO) 8 MG CAPS capsule Take 8 mg by mouth at bedtime.       . traMADol (ULTRAM) 50 MG tablet Take 50 mg by mouth every 6 (six) hours as needed for pain.       [name] warfarin (COUMADIN) 3 MG tablet Take 1.5-3 mg by mouth at bedtime. Take 1/2 tab on Wednesday and 1 tab all other days       Allergies: No Known Allergies  Family  History  Problem Relation Age of Onset  . Heart disease Mother   . Rheum arthritis Mother   . Deep vein thrombosis Sister    Social History:  reports that he quit smoking about 71 years ago. His smoking use included Cigarettes. He has a 2 pack-year smoking history. He has never used smokeless tobacco. He reports that he does not drink alcohol or use illicit drugs.  ROS: A complete review of systems was performed.  All systems are negative except for pertinent findings as noted. @ROS @   Physical Exam:  Vital signs in last 24 hours: Temp:  [97.5 F (36.4 C)-97.9 F (36.6 C)] 97.7 F (36.5 C) (09/09 0606) Pulse Rate:  [88-107] 92 (09/09 0625) Resp:  [16] 16 (09/09 0606) BP: (133-157)/(69-79) 133/79 mmHg (09/09 0606) SpO2:  [97 %-99 %] 97 % (09/09 0756) Weight:  [95.255 kg (210 lb)] 95.255 kg (210 lb) (09/08 1837) General:  Alert and oriented, No acute distress HEENT: Normocephalic, atraumatic Neck: No JVD or lymphadenopathy Cardiovascular: Regular rate and rhythm Lungs: Regular rate and effort Abdomen: Soft, nontender, nondistended, no abdominal masses Back: No CVA tenderness Extremities: No edema Neurologic: Grossly intact GU: simple penoscrotal edema, indurated right testicle. No fluctuance, no necrosis.   Laboratory Data:  Results for orders placed during the hospital encounter of 11/04/12 (from the past 24 hour(s))  URINALYSIS, ROUTINE W REFLEX MICROSCOPIC     Status: None   Collection Time    11/04/12  7:55 PM      Result Value Range   Color, Urine YELLOW  YELLOW   APPearance CLEAR  CLEAR   Specific Gravity, Urine 1.014  1.005 - 1.030   pH 7.0  5.0 - 8.0   Glucose, UA NEGATIVE  NEGATIVE mg/dL   Hgb urine dipstick NEGATIVE  NEGATIVE   Bilirubin Urine NEGATIVE  NEGATIVE   Ketones, ur NEGATIVE  NEGATIVE mg/dL   Protein, ur NEGATIVE  NEGATIVE mg/dL   Urobilinogen, UA 0.2  0.0 - 1.0 mg/dL   Nitrite NEGATIVE  NEGATIVE   Leukocytes, UA NEGATIVE  NEGATIVE  CLOSTRIDIUM  DIFFICILE BY PCR     Status: None   Collection Time    11/04/12  7:57 PM      Result Value Range   C difficile by pcr NEGATIVE  NEGATIVE  CBC     Status: Abnormal   Collection Time  11/04/12  8:10 PM      Result Value Range   WBC 12.6 (*) 4.0 - 10.5 K/uL   RBC 3.89 (*) 4.22 - 5.81 MIL/uL   Hemoglobin 11.9 (*) 13.0 - 17.0 g/dL   HCT 33.8 (*) 39.0 - 52.0 %   MCV 86.9  78.0 - 100.0 fL   MCH 30.6  26.0 - 34.0 pg   MCHC 35.2  30.0 - 36.0 g/dL   RDW 12.9  11.5 - 15.5 %   Platelets 230  150 - 400 K/uL  COMPREHENSIVE METABOLIC PANEL     Status: Abnormal   Collection Time    11/04/12  8:10 PM      Result Value Range   Sodium 124 (*) 135 - 145 mEq/L   Potassium 4.2  3.5 - 5.1 mEq/L   Chloride 87 (*) 96 - 112 mEq/L   CO2 26  19 - 32 mEq/L   Glucose, Bld 168 (*) 70 - 99 mg/dL   BUN 27 (*) 6 - 23 mg/dL   Creatinine, Ser 1.52 (*) 0.50 - 1.35 mg/dL   Calcium 8.6  8.4 - 10.5 mg/dL   Total Protein 6.9  6.0 - 8.3 g/dL   Albumin 3.1 (*) 3.5 - 5.2 g/dL   AST 31  0 - 37 U/L   ALT 35  0 - 53 U/L   Alkaline Phosphatase 96  39 - 117 U/L   Total Bilirubin 0.5  0.3 - 1.2 mg/dL   GFR calc non Af Amer 39 (*) >90 mL/min   GFR calc Af Amer 45 (*) >90 mL/min  PROTIME-INR     Status: Abnormal   Collection Time    11/04/12  8:10 PM      Result Value Range   Prothrombin Time 31.3 (*) 11.6 - 15.2 seconds   INR 3.16 (*) 0.00 - 1.49  APTT     Status: Abnormal   Collection Time    11/04/12  8:10 PM      Result Value Range   aPTT 63 (*) 24 - 37 seconds  GLUCOSE, CAPILLARY     Status: Abnormal   Collection Time    11/04/12  9:54 PM      Result Value Range   Glucose-Capillary 114 (*) 70 - 99 mg/dL   Comment 1 Notify RN    CBC     Status: Abnormal   Collection Time    11/05/12  3:58 AM      Result Value Range   WBC 10.6 (*) 4.0 - 10.5 K/uL   RBC 3.83 (*) 4.22 - 5.81 MIL/uL   Hemoglobin 11.7 (*) 13.0 - 17.0 g/dL   HCT 33.0 (*) 39.0 - 52.0 %   MCV 86.2  78.0 - 100.0 fL   MCH 30.5  26.0 - 34.0  pg   MCHC 35.5  30.0 - 36.0 g/dL   RDW 12.9  11.5 - 15.5 %   Platelets 231  150 - 400 K/uL  BASIC METABOLIC PANEL     Status: Abnormal   Collection Time    11/05/12  3:58 AM      Result Value Range   Sodium 123 (*) 135 - 145 mEq/L   Potassium 3.7  3.5 - 5.1 mEq/L   Chloride 88 (*) 96 - 112 mEq/L   CO2 26  19 - 32 mEq/L   Glucose, Bld 123 (*) 70 - 99 mg/dL   BUN 27 (*) 6 - 23 mg/dL   Creatinine, Ser 1.45 (*) 0.50 -  1.35 mg/dL   Calcium 8.5  8.4 - 10.5 mg/dL   GFR calc non Af Amer 41 (*) >90 mL/min   GFR calc Af Amer 48 (*) >90 mL/min  PROTIME-INR     Status: Abnormal   Collection Time    11/05/12  3:58 AM      Result Value Range   Prothrombin Time 31.9 (*) 11.6 - 15.2 seconds   INR 3.24 (*) 0.00 - 1.49   Recent Results (from the past 240 hour(s))  URINE CULTURE     Status: None   Collection Time    10/27/12  5:44 PM      Result Value Range Status   Specimen Description URINE, CLEAN CATCH   Final   Special Requests NONE   Final   Culture  Setup Time     Final   Value: 10/27/2012 23:43     Performed at [place]     Final   Value: NO GROWTH     Performed at [facility]   Culture     Final   Value: NO GROWTH     Performed at [facility]   Report Status 10/28/2012 FINAL   Final  CLOSTRIDIUM DIFFICILE BY PCR     Status: None   Collection Time    11/04/12  7:57 PM      Result Value Range Status   C difficile by pcr NEGATIVE  NEGATIVE Final   Comment: Performed at [facility]   Creatinine:  Recent Labs  11/04/12 2010 11/05/12 0358  CREATININE 1.52* 1.45*    Impression/Assessment/Plan: ARF - improving with hydration BPH - will check a PVR Orchitis - clinically improving on IV abx. Discussed orchiectomy to speed recovery but he would like to keep the right testicle. His left testicle is atrophic. UA clear. WBC improved. Once Dr. Brigitte Pulse feels patient is medically stable he can be d/c'd from my point of view.    Fredricka Bonine 11/05/2012, 11:07 AM   Cc; Dr. Brigitte Pulse

## 2012-11-05 NOTE — Progress Notes (Signed)
Subjective: Patient reports explosive diarrhea last evening. Also having urinary incontinence. Scrotal swelling seems stable. Pain is controlled.  Objective: Vital signs in last 24 hours: Temp:  [97.5 F (36.4 C)-97.9 F (36.6 C)] 97.7 F (36.5 C) (09/09 0606) Pulse Rate:  [88-107] 92 (09/09 0625) Resp:  [16] 16 (09/09 0606) BP: (133-157)/(69-79) 133/79 mmHg (09/09 0606) SpO2:  [97 %-99 %] 97 % (09/09 0756) Weight:  [95.255 kg (210 lb)] 95.255 kg (210 lb) (09/08 1837) Weight change:  Last BM Date: 11/04/12 (incontinence )  CBG (last 3)   Recent Labs  11/04/12 2154  GLUCAP 114*    Intake/Output from previous day: 09/08 0701 - 09/09 0700 In: 602 [P.O.:200; IV Piggyback:50] Out: 150 [Urine:150] Intake/Output this shift:    General appearance: alert and no distress Eyes: no scleral icterus Throat: oropharynx moist without erythema Resp: clear to auscultation bilaterally Cardio: regular rate and rhythm GI: soft, non-tender; bowel sounds normal; no masses,  no organomegaly Extremities: no clubbing, cyanosis or edema GU: Persistent massive scrotal edema with markedly enlarged right testicle which is firm and tender  Lab Results:  Recent Labs  11/04/12 2010 11/05/12 0358  NA 124* 123*  K 4.2 3.7  CL 87* 88*  CO2 26 26  GLUCOSE 168* 123*  BUN 27* 27*  CREATININE 1.52* 1.45*  CALCIUM 8.6 8.5    Recent Labs  11/04/12 2010  AST 31  ALT 35  ALKPHOS 96  BILITOT 0.5  PROT 6.9  ALBUMIN 3.1*    Recent Labs  11/04/12 2010 11/05/12 0358  WBC 12.6* 10.6*  HGB 11.9* 11.7*  HCT 33.8* 33.0*  MCV 86.9 86.2  PLT 230 231   Lab Results  Component Value Date   INR 3.24* 11/05/2012   INR 3.16* 11/04/2012   INR 2.47* 07/06/2012    Studies/Results: US Scrotum  11/04/2012   *RADIOLOGY REPORT*  Clinical Data: Right scrotal pain and swelling  SCROTAL ULTRASOUND DOPPLER ULTRASOUND OF THE TESTICLES  Technique:  Complete ultrasound examination of the testicles, epididymis,  and other scrotal structures was performed.  Color and spectral Doppler ultrasound were also utilized to evaluate blood flow to the testicles.  Comparison:  None.  Findings:  Right testis:  Measures 4.2 x 2.9 x 2.6 cm.  There is hyperemia and increased echogenicity of the right testis.  No right testicular mass.  Color Doppler flow with arterial and venous waveforms noted.  Left testis:  Measures 3.2 x 1.8 x 1.4 cm.  There is color Doppler flow with arterial and venous waveforms.  Asymmetric enlargement and increased heterogeneity of the right epididymis is noted.  The right epididymis measures 1.7 cm. The left epididymis appears normal.The testicles are symmetric in size and echogenicity.  The complex heterogeneous appearing right hydrocele is noted. The hydrocelemarrow has a "lace-like appearance suggesting the presence of blood products.  No varicocele noted.  Bilateral scrotal wall thickening is identified.  IMPRESSION:  1.  No evidence for testicular mass or torsion. 2.  Right epididymal orchitis/epididymitis. 3.  Progression of right hydrocele which now is diffusely heterogeneous in appearance and may be complicated by hemorrhage.   Original Report Authenticated By: Kerby Moors, M.D.   [place] Art/ven Flow Abd Pelv Doppler  11/04/2012   *RADIOLOGY REPORT*  Clinical Data: Right scrotal pain and swelling  SCROTAL ULTRASOUND DOPPLER ULTRASOUND OF THE TESTICLES  Technique:  Complete ultrasound examination of the testicles, epididymis, and other scrotal structures was performed.  Color and spectral Doppler ultrasound were also utilized to evaluate  blood flow to the testicles.  Comparison:  None.  Findings:  Right testis:  Measures 4.2 x 2.9 x 2.6 cm.  There is hyperemia and increased echogenicity of the right testis.  No right testicular mass.  Color Doppler flow with arterial and venous waveforms noted.  Left testis:  Measures 3.2 x 1.8 x 1.4 cm.  There is color Doppler flow with arterial and venous waveforms.   Asymmetric enlargement and increased heterogeneity of the right epididymis is noted.  The right epididymis measures 1.7 cm. The left epididymis appears normal.The testicles are symmetric in size and echogenicity.  The complex heterogeneous appearing right hydrocele is noted. The hydrocelemarrow has a "lace-like appearance suggesting the presence of blood products.  No varicocele noted.  Bilateral scrotal wall thickening is identified.  IMPRESSION:  1.  No evidence for testicular mass or torsion. 2.  Right epididymal orchitis/epididymitis. 3.  Progression of right hydrocele which now is diffusely heterogeneous in appearance and may be complicated by hemorrhage.   Original Report Authenticated By: Kerby Moors, M.D.     Medications: Scheduled: . beta carotene w/minerals  1 tablet Oral QHS  . budesonide-formoterol  2 puff Inhalation BID  . calcium-vitamin D  1 tablet Oral QHS  . ceFEPime (MAXIPIME) IV  1 g Intravenous Q12H  . cholecalciferol  2,000 Units Oral Daily  . finasteride  5 mg Oral Daily  . fluticasone  1 spray Each Nare Daily  . furosemide  20 mg Oral Daily  . [START ON 11/06/2012] influenza vac split quadrivalent PF  0.5 mL Intramuscular Tomorrow-1000  . levothyroxine  75 mcg Oral QAC breakfast  . Linaclotide  145 mcg Oral Daily  . losartan  100 mg Oral Daily  . multivitamin with minerals  1 tablet Oral Daily  . omega-3 acid ethyl esters  1 g Oral q morning - 10a  . pantoprazole  40 mg Oral Daily  . tamsulosin  0.4 mg Oral QPC supper   Continuous:   Assessment/Plan: Principal Problem: 1. Orchitis of right testicle- scrotal ultrasound consistent with orchitis with possible hemorrhagic component. Continue cefepime to cover Pseudomonas based on recent urine culture. Will hold Coumadin due to the possibility of superimposed bleeding. Discussed with Dr. Junious Silk who will evaluate the patient. May ultimately need to consider orchiectomy if no improvement with antibiotics and  elevation.  Active Problems: 2. Hyponatremia-this is been a recurrent issue for him in the past. Likely secondary to Maxzide use. We'll hold off on IV fluid hydration due to the risk of worsening scrotal edema. Discontinue diuretics.   3. HYPERTENSION- continue home regimen except for diuretics. 4. Atrial fibrillation status post Pacemaker-Medtronic- pacemaker was adjusted yesterday. Continue to monitor rate. Coumadin due to a supratherapeutic INR and possible hemorrhage into testicle. In the absence of ongoing bleed and testicular enlargement will hold off on reversing his coagulopathy. 5. BPH with urinary obstruction- ultimately, will need prostatectomy based on massive BPH causing obstructive symptoms and contributing to orchitis. 6. Disposition-will remain in patient until we see improvement in his orchitis and scrotal edema. At that time, can transition to PICC line for home IV antibiotics to complete treatment course.    LOS: 1 day   Lafaye Mcelmurry,W [name] 11/05/2012, 8:23 AM

## 2012-11-05 NOTE — Progress Notes (Signed)
Placed order for patient to receive influenza vaccine on 9/10.

## 2012-11-05 NOTE — Progress Notes (Addendum)
Patient admitted to room from MD office with orchitis. Admitted for IV antibiotics and urology consult. Patient reports having incontinence of urine at home. Scrotum with significant swelling/tenderness/redness. Patient oriented to room and unit. Verbalized use of call bell and bed controls and demonstrated use of each.

## 2012-11-05 NOTE — Care Management Note (Signed)
   CARE MANAGEMENT NOTE 11/05/2012   Patient:  ANGELINA, HANKO   Account Number:  [id number]  Date Initiated:  11/05/2012  Documentation initiated by:  Madsen Riddle  Subjective/Objective Assessment:   79 yo male admitted with orchitis.     Action/Plan:   Home when stable   Anticipated DC Date:     Anticipated DC Plan:  [place]  CM consult      Choice offered to / List presented to:  NA   DME arranged  NA      DME agency  NA     [place] arranged  NA      [place] agency  NA   Status of service:  In process, will continue to follow Medicare Important Message given?   (If response is "NO", the following Medicare IM given date fields will be blank) Date Medicare IM given:   Date Additional Medicare IM given:    Discharge Disposition:    Per UR Regulation:  Reviewed for med. necessity/level of care/duration of stay  If discussed at [place] of Stay Meetings, dates discussed:    Comments:  11/05/12 Venita Lick Octavie Westerhold,RN,MSN [id number] UR complete. Will continue to assess for potential dc needs.

## 2012-11-05 NOTE — Progress Notes (Signed)
Patient with explosive incontinent episode of diarrhea on bathroom floor. Patient had been on antibiotics for > 1 week. Stool sent for c diff per nursing driven c diff screen orders. Will monitor.

## 2012-11-05 NOTE — Progress Notes (Signed)
ANTICOAGULATION CONSULT NOTE - Follow Up Consult  Pharmacy Consult for warfarin Indication: atrial fibrillation  No Known Allergies  Patient Measurements: Height: 6' (182.9 cm) Weight: 210 lb (95.255 kg) IBW/kg (Calculated) : 77.6  Vital Signs: Temp: 97.7 F (36.5 C) (09/09 0606) Temp src: Oral (09/09 0606) BP: 133/79 mmHg (09/09 0606) Pulse Rate: 92 (09/09 0625)  Labs:  Recent Labs  11/04/12 2010 11/05/12 0358  HGB 11.9* 11.7*  HCT 33.8* 33.0*  PLT 230 231  APTT 63*  --   LABPROT 31.3* 31.9*  INR 3.16* 3.24*  CREATININE 1.52* 1.45*    Estimated Creatinine Clearance: 42.2 ml/min (by C-G formula based on Cr of 1.45).   Medications:  Scheduled:  . beta carotene w/minerals  1 tablet Oral QHS  . budesonide-formoterol  2 puff Inhalation BID  . calcium-vitamin D  1 tablet Oral QHS  . ceFEPime (MAXIPIME) IV  1 g Intravenous Q12H  . cholecalciferol  2,000 Units Oral Daily  . finasteride  5 mg Oral Daily  . fluticasone  1 spray Each Nare Daily  . furosemide  20 mg Oral Daily  . [START ON 11/06/2012] influenza vac split quadrivalent PF  0.5 mL Intramuscular Tomorrow-1000  . levothyroxine  75 mcg Oral QAC breakfast  . Linaclotide  145 mcg Oral Daily  . losartan  100 mg Oral Daily  . multivitamin with minerals  1 tablet Oral Daily  . omega-3 acid ethyl esters  1 g Oral q morning - 10a  . pantoprazole  40 mg Oral Daily  . tamsulosin  0.4 mg Oral QPC supper    Assessment: 79 yo male on warfarin prior to admission for atrial fibrillation. INR supratherapeutic on admission and continues to be elevated (3.24 today) despite holding doses.  Scrotal ultrasound consistent with orchitis of the testicle with possible hemorrhagic component.  MD notes to hold warfarin due to the possibility of superimposed bleeding (holding off on reversing coagulopathy in absence of ongoing bleed and testicular enlargement). H/H stable.   Goal of Therapy:  INR 2-3 Monitor platelets by  anticoagulation protocol: Yes   Plan:  - No warfarin tonight (holding d/t supratherapeutic INR and possible hemorrhagic component seen on scrotal ultrasound) - F/u daily PT/INR  Dicky Doe, PharmD Clinical Pharmacist Pager: [phone] 11/05/2012 9:36 AM

## 2012-11-06 LAB — BASIC METABOLIC PANEL
BUN: 22 mg/dL (ref 6–23)
GFR calc Af Amer: 51 mL/min — ABNORMAL LOW (ref >90)
GFR calc non Af Amer: 44 mL/min — ABNORMAL LOW (ref >90)
Potassium: 3.6 mEq/L (ref 3.5–5.1)
Sodium: 125 mEq/L — ABNORMAL LOW (ref 135–145)

## 2012-11-06 LAB — CBC
MCHC: 34.8 g/dL (ref 30.0–36.0)
RDW: 12.8 % (ref 11.5–15.5)

## 2012-11-06 LAB — PROTIME-INR: INR: 2.81 — ABNORMAL HIGH (ref 0.00–1.49)

## 2012-11-06 MED ORDER — DOCUSATE SODIUM 100 MG PO CAPS
100.0000 mg | ORAL_CAPSULE | Freq: Every day | ORAL | Status: DC
  Administered 2012-11-06 – 2012-11-08 (×3): 100 mg via ORAL
  Filled 2012-11-06 (×3): qty 1

## 2012-11-06 MED ORDER — PHENAZOPYRIDINE HCL 100 MG PO TABS
100.0000 mg | ORAL_TABLET | Freq: Three times a day (TID) | ORAL | Status: DC
  Administered 2012-11-06 – 2012-11-08 (×6): 100 mg via ORAL
  Filled 2012-11-06 (×11): qty 1

## 2012-11-06 NOTE — Progress Notes (Signed)
Patient ID: [name], male   DOB: 1922-08-29, 79 y.o.   MRN: [id number]  C/o dysuria.   Filed Vitals:   11/06/12 0648  BP: 151/78  Pulse: 99  Temp: 97.7 F (36.5 C)  Resp: 20   PE: NAD Eating breakfast, watching TV.  GU - stable penoscrotal edema/right hemiscrotum, indurated right testicle, no necrosis or fluctuance  CBC    Component Value Date/Time   WBC 12.3* 11/06/2012 0357   RBC 4.10* 11/06/2012 0357   HGB 12.4* 11/06/2012 0357   HCT 35.6* 11/06/2012 0357   PLT 266 11/06/2012 0357   MCV 86.8 11/06/2012 0357   MCH 30.2 11/06/2012 0357   MCHC 34.8 11/06/2012 0357   RDW 12.8 11/06/2012 0357   LYMPHSABS 1.4 10/27/2012 1835   MONOABS 1.0 10/27/2012 1835   EOSABS 0.1 10/27/2012 1835   BASOSABS 0.1 10/27/2012 1835    Bladder Scan - Zero x 2   Imp/Plan -  -cont IV abx

## 2012-11-06 NOTE — Progress Notes (Signed)
Subjective: Swelling is getting better.  Having great difficulty with urination- burning, hesitancy.  Bladder scan shows adequate emptying.  Objective: Vital signs in last 24 hours: Temp:  [97.7 F (36.5 C)-98.4 F (36.9 C)] 97.7 F (36.5 C) (09/10 0648) Pulse Rate:  [84-99] 99 (09/10 0648) Resp:  [18-20] 20 (09/10 0648) BP: (123-151)/(41-78) 151/78 mmHg (09/10 0648) SpO2:  [95 %-100 %] 100 % (09/10 0648) Weight change:  Last BM Date: 11/04/12  CBG (last 3)   Recent Labs  11/04/12 2154  GLUCAP 114*    Intake/Output from previous day: 09/09 0701 - 09/10 0700 In: 1592 [P.O.:1060; IV Piggyback:50] Out: 200 [Urine:200] Intake/Output this shift:    General appearance: alert and no distress Eyes: no scleral icterus Throat: oropharynx moist without erythema Resp: clear to auscultation bilaterally Cardio: regular rate and rhythm GI: soft, non-tender; bowel sounds normal; no masses,  no organomegaly Extremities: no clubbing, cyanosis or edema GU: decreased scrotal swelling but still marked particularly surrounding glans; Testicle slightly smaller with less induration and tenderness   Lab Results:  Recent Labs  11/05/12 0358 11/06/12 0357  NA 123* 125*  K 3.7 3.6  CL 88* 90*  CO2 26 29  GLUCOSE 123* 185*  BUN 27* 22  CREATININE 1.45* 1.37*  CALCIUM 8.5 9.0    Recent Labs  11/04/12 2010  AST 31  ALT 35  ALKPHOS 96  BILITOT 0.5  PROT 6.9  ALBUMIN 3.1*    Recent Labs  11/05/12 0358 11/06/12 0357  WBC 10.6* 12.3*  HGB 11.7* 12.4*  HCT 33.0* 35.6*  MCV 86.2 86.8  PLT 231 266   Lab Results  Component Value Date   INR 2.81* 11/06/2012   INR 3.24* 11/05/2012   INR 3.16* 11/04/2012   Studies/Results: US Scrotum  11/04/2012   *RADIOLOGY REPORT*  Clinical Data: Right scrotal pain and swelling  SCROTAL ULTRASOUND DOPPLER ULTRASOUND OF THE TESTICLES  Technique:  Complete ultrasound examination of the testicles, epididymis, and other scrotal structures was  performed.  Color and spectral Doppler ultrasound were also utilized to evaluate blood flow to the testicles.  Comparison:  None.  Findings:  Right testis:  Measures 4.2 x 2.9 x 2.6 cm.  There is hyperemia and increased echogenicity of the right testis.  No right testicular mass.  Color Doppler flow with arterial and venous waveforms noted.  Left testis:  Measures 3.2 x 1.8 x 1.4 cm.  There is color Doppler flow with arterial and venous waveforms.  Asymmetric enlargement and increased heterogeneity of the right epididymis is noted.  The right epididymis measures 1.7 cm. The left epididymis appears normal.The testicles are symmetric in size and echogenicity.  The complex heterogeneous appearing right hydrocele is noted. The hydrocelemarrow has a "lace-like appearance suggesting the presence of blood products.  No varicocele noted.  Bilateral scrotal wall thickening is identified.  IMPRESSION:  1.  No evidence for testicular mass or torsion. 2.  Right epididymal orchitis/epididymitis. 3.  Progression of right hydrocele which now is diffusely heterogeneous in appearance and may be complicated by hemorrhage.   Original Report Authenticated By: Kerby Moors, M.D.   [place] Art/ven Flow Abd Pelv Doppler  11/04/2012   *RADIOLOGY REPORT*  Clinical Data: Right scrotal pain and swelling  SCROTAL ULTRASOUND DOPPLER ULTRASOUND OF THE TESTICLES  Technique:  Complete ultrasound examination of the testicles, epididymis, and other scrotal structures was performed.  Color and spectral Doppler ultrasound were also utilized to evaluate blood flow to the testicles.  Comparison:  None.  Findings:  Right testis:  Measures 4.2 x 2.9 x 2.6 cm.  There is hyperemia and increased echogenicity of the right testis.  No right testicular mass.  Color Doppler flow with arterial and venous waveforms noted.  Left testis:  Measures 3.2 x 1.8 x 1.4 cm.  There is color Doppler flow with arterial and venous waveforms.  Asymmetric enlargement and increased  heterogeneity of the right epididymis is noted.  The right epididymis measures 1.7 cm. The left epididymis appears normal.The testicles are symmetric in size and echogenicity.  The complex heterogeneous appearing right hydrocele is noted. The hydrocelemarrow has a "lace-like appearance suggesting the presence of blood products.  No varicocele noted.  Bilateral scrotal wall thickening is identified.  IMPRESSION:  1.  No evidence for testicular mass or torsion. 2.  Right epididymal orchitis/epididymitis. 3.  Progression of right hydrocele which now is diffusely heterogeneous in appearance and may be complicated by hemorrhage.   Original Report Authenticated By: Kerby Moors, M.D.     Medications: Scheduled: . beta carotene w/minerals  1 tablet Oral QHS  . budesonide-formoterol  2 puff Inhalation BID  . calcium-vitamin D  1 tablet Oral QHS  . ceFEPime (MAXIPIME) IV  1 g Intravenous Q12H  . cholecalciferol  2,000 Units Oral Daily  . finasteride  5 mg Oral Daily  . fluticasone  1 spray Each Nare Daily  . influenza vac split quadrivalent PF  0.5 mL Intramuscular Tomorrow-1000  . levothyroxine  75 mcg Oral QAC breakfast  . Linaclotide  145 mcg Oral Daily  . losartan  100 mg Oral Daily  . multivitamin with minerals  1 tablet Oral Daily  . omega-3 acid ethyl esters  1 g Oral q morning - 10a  . pantoprazole  40 mg Oral Daily  . tamsulosin  0.4 mg Oral QPC supper  . Warfarin - Pharmacist Dosing Inpatient   Does not apply q1800   Continuous:   Assessment/Plan: Principal Problem: 1. Orchitis of right testicle with scrotal edema- clinically improving with Cefepime and elevation.  Will continue IV antibiotics to complete ?2 week course.  Plan for PICC line tomorrow to transition to IV antibiotics at home.  Active Problems: 2. BPH with urinary obstruction- plans to see Dr. Tammi Klippel on 9/28 to discuss robotic prostatectomy.   3. Dysuria- urinalysis negative- suspect this is due to edema and BPH.  Will  add Pyridium. 4. Diarrhea- no recurrence.  C.diff negative. 5. Hyponatremia- slight improvement off of Maxzide.  Needs to avoid Maxzide and limit diuretics.    6. HYPERTENSION- continue home meds- Lasix on hold due to hypotnatremia 7. Atrial fibrillation s/p Pacemaker-Medtronic- will resume Coumadin tomorrow if stable.  8. Disposition- anticipate discharge on Friday if continued improvement.  PICC line tomorrow.   LOS: 2 days   Ayren Zumbro,W [name] 11/06/2012, 7:05 AM

## 2012-11-06 NOTE — Progress Notes (Signed)
ANTICOAGULATION CONSULT NOTE - Follow Up Consult  Pharmacy Consult for warfarin Indication: atrial fibrillation  No Known Allergies  Patient Measurements: Height: 6' (182.9 cm) Weight: 210 lb (95.255 kg) IBW/kg (Calculated) : 77.6  Vital Signs: Temp: 97.7 F (36.5 C) (09/10 0648) Temp src: Oral (09/10 0648) BP: 151/78 mmHg (09/10 0648) Pulse Rate: 99 (09/10 0648)  Labs:  Recent Labs  11/04/12 2010 11/05/12 0358 11/06/12 0357  HGB 11.9* 11.7* 12.4*  HCT 33.8* 33.0* 35.6*  PLT 230 231 266  APTT 63*  --   --   LABPROT 31.3* 31.9* 28.6*  INR 3.16* 3.24* 2.81*  CREATININE 1.52* 1.45* 1.37*   Estimated Creatinine Clearance: 44.7 ml/min (by C-G formula based on Cr of 1.37).  Medications:  Scheduled:  . beta carotene w/minerals  1 tablet Oral QHS  . budesonide-formoterol  2 puff Inhalation BID  . calcium-vitamin D  1 tablet Oral QHS  . ceFEPime (MAXIPIME) IV  1 g Intravenous Q12H  . cholecalciferol  2,000 Units Oral Daily  . docusate sodium  100 mg Oral Daily  . finasteride  5 mg Oral Daily  . fluticasone  1 spray Each Nare Daily  . influenza vac split quadrivalent PF  0.5 mL Intramuscular Tomorrow-1000  . levothyroxine  75 mcg Oral QAC breakfast  . Linaclotide  145 mcg Oral Daily  . losartan  100 mg Oral Daily  . multivitamin with minerals  1 tablet Oral Daily  . omega-3 acid ethyl esters  1 g Oral q morning - 10a  . pantoprazole  40 mg Oral Daily  . phenazopyridine  100 mg Oral TID WC  . tamsulosin  0.4 mg Oral QPC supper  . Warfarin - Pharmacist Dosing Inpatient   Does not apply q1800   Assessment: 79 yo male on warfarin prior to admission for atrial fibrillation. INR supratherapeutic on admission. Scrotal ultrasound consistent with orchitis of the testicle with possible hemorrhagic component.  MD notes to hold warfarin due to the possibility of superimposed bleeding (holding off on reversing coagulopathy in absence of ongoing bleed and testicular enlargement).    Per MD note: continuing to hold Coumadin  INR = 2.81 this morning, within goal range, on regular diet  H/H, platelets improved. Srotal edema improved.  Goal of Therapy:  INR 2-3 Monitor platelets by anticoagulation protocol: Yes   Plan:   No Warfarin today per MD, may resume tomorrow  Daily PT/INR  Minda Ditto PharmD Pager [phone] 11/06/2012, 8:42 AM

## 2012-11-07 ENCOUNTER — Encounter (HOSPITAL_COMMUNITY): Payer: Self-pay | Admitting: Cardiology

## 2012-11-07 ENCOUNTER — Inpatient Hospital Stay (HOSPITAL_COMMUNITY): Payer: Medicare Other

## 2012-11-07 DIAGNOSIS — I5032 Chronic diastolic (congestive) heart failure: Secondary | ICD-10-CM | POA: Diagnosis present

## 2012-11-07 DIAGNOSIS — E669 Obesity, unspecified: Secondary | ICD-10-CM | POA: Insufficient documentation

## 2012-11-07 DIAGNOSIS — M519 Unspecified thoracic, thoracolumbar and lumbosacral intervertebral disc disorder: Secondary | ICD-10-CM | POA: Insufficient documentation

## 2012-11-07 DIAGNOSIS — Z7901 Long term (current) use of anticoagulants: Secondary | ICD-10-CM

## 2012-11-07 LAB — CBC
MCHC: 33.8 g/dL (ref 30.0–36.0)
MCV: 87.4 fL (ref 78.0–100.0)
Platelets: 228 10*3/uL (ref 150–400)
RDW: 12.8 % (ref 11.5–15.5)
WBC: 9.1 10*3/uL (ref 4.0–10.5)

## 2012-11-07 LAB — BASIC METABOLIC PANEL
Chloride: 89 mEq/L — ABNORMAL LOW (ref 96–112)
Creatinine, Ser: 1.16 mg/dL (ref 0.50–1.35)
GFR calc Af Amer: 63 mL/min — ABNORMAL LOW (ref >90)
GFR calc non Af Amer: 54 mL/min — ABNORMAL LOW (ref >90)

## 2012-11-07 LAB — PROTIME-INR: Prothrombin Time: 31.5 seconds — ABNORMAL HIGH (ref 11.6–15.2)

## 2012-11-07 LAB — HEMOGLOBIN A1C: Mean Plasma Glucose: 143 mg/dL — ABNORMAL HIGH (ref <117)

## 2012-11-07 NOTE — Progress Notes (Signed)
Inpatient Diabetes Program Recommendations  AACE/ADA: New Consensus Statement on Inpatient Glycemic Control (2013)  Target Ranges:  Prepandial:   less than 140 mg/dL      Peak postprandial:   less than 180 mg/dL (1-2 hours)      Critically ill patients:  140 - 180 mg/dL  Results for BASCOM, STAMLER (MRN [id number]) as of 11/07/2012 08:54  Ref. Range 11/06/2012 03:57 11/07/2012 03:35  Glucose Latest Range: 70-99 mg/dL 185 (H) 205 (H)   Inpatient Diabetes Program Recommendations Correction (SSI): consider starting sensitive scale TID HgbA1C: pending Thank you  Raoul Pitch BSN, RN,CDE Inpatient Diabetes Coordinator [phone] (team pager)

## 2012-11-07 NOTE — Progress Notes (Signed)
ANTICOAGULATION CONSULT NOTE - Follow Up Consult  Pharmacy Consult for warfarin Indication: atrial fibrillation  No Known Allergies  Patient Measurements: Height: 6' (182.9 cm) Weight: 210 lb (95.255 kg) IBW/kg (Calculated) : 77.6  Labs:  Recent Labs  11/04/12 2010 11/05/12 0358 11/06/12 0357 11/07/12 0335  HGB 11.9* 11.7* 12.4* 10.5*  HCT 33.8* 33.0* 35.6* 31.1*  PLT 230 231 266 228  APTT 63*  --   --   --   LABPROT 31.3* 31.9* 28.6* 31.5*  INR 3.16* 3.24* 2.81* 3.19*  CREATININE 1.52* 1.45* 1.37* 1.16   Estimated Creatinine Clearance: 52.7 ml/min (by C-G formula based on Cr of 1.16).  Medications:  Scheduled:  . beta carotene w/minerals  1 tablet Oral QHS  . budesonide-formoterol  2 puff Inhalation BID  . calcium-vitamin D  1 tablet Oral QHS  . ceFEPime (MAXIPIME) IV  1 g Intravenous Q12H  . cholecalciferol  2,000 Units Oral Daily  . docusate sodium  100 mg Oral Daily  . finasteride  5 mg Oral Daily  . fluticasone  1 spray Each Nare Daily  . levothyroxine  75 mcg Oral QAC breakfast  . Linaclotide  145 mcg Oral Daily  . losartan  100 mg Oral Daily  . multivitamin with minerals  1 tablet Oral Daily  . omega-3 acid ethyl esters  1 g Oral q morning - 10a  . pantoprazole  40 mg Oral Daily  . phenazopyridine  100 mg Oral TID WC  . tamsulosin  0.4 mg Oral QPC supper  . Warfarin - Pharmacist Dosing Inpatient   Does not apply q1800   Assessment: 79 yo male on warfarin prior to admission for atrial fibrillation. INR supratherapeutic on admission. Scrotal ultrasound consistent with orchitis of the testicle with possible hemorrhagic component.  MD notes to hold warfarin due to the possibility of superimposed bleeding (holding off on reversing coagulopathy in absence of ongoing bleed and testicular enlargement).   INR = 3.19 this morning, increase likely due to abx. Regular diet with good po intake.  H/H, platelets low, stable. Srotal edema continues to improve.  Plan  PICC line today, possible d/c tomorrow  Goal of Therapy:  INR 2-3   Plan:   No Warfarin today   Daily PT/INR  Minda Ditto PharmD Pager [phone] 11/07/2012, 8:39 AM

## 2012-11-07 NOTE — Care Management Note (Signed)
Cm spoke with patient at the bedside concerning discharge planning. Pt disposition includes home with IV ABX x 10 days. Per pt choice [facility] to provide [facility] services. [place] IV RN notified of referral. Pt to dc with spouse and adult son assisting home care. Pt states having DME for home use. Pt has private duty aide who also assist with home care and provide tx home.    Venita Lick Cleve Paolillo,RN,MSN [phone]

## 2012-11-07 NOTE — Progress Notes (Signed)
Patient ID: [name], male   DOB: 09-08-1922, 79 y.o.   MRN: [id number]   Pt feeling much better today. Less testicle pain, less dysuria.   PE: Sitting in chair watching TV, eating breakfast Some penoscrotal edema remains, rigth testicle indurated, no fluctuance, no necrosis or drainage, maybe a bit better today.   WBC down to 9 Cr improved to 1.2  A/P - Improving on IV abx, no surgical intervention

## 2012-11-07 NOTE — Progress Notes (Signed)
Subjective: Feels much better- wants to go home.  Decreased testicular pain though swelling persists.  No dysuria.  Objective: Vital signs in last 24 hours: Temp:  [97.9 F (36.6 C)-98.5 F (36.9 C)] 97.9 F (36.6 C) (09/11 0520) Pulse Rate:  [85-95] 85 (09/11 0520) Resp:  [18-20] 20 (09/11 0520) BP: (129-149)/(61-73) 129/61 mmHg (09/11 0520) SpO2:  [96 %-100 %] 99 % (09/11 0520) Weight change:  Last BM Date: 11/04/12  CBG (last 3)   Recent Labs  11/04/12 2154  GLUCAP 114*    Intake/Output from previous day: 09/10 0701 - 09/11 0700 In: 530 [P.O.:480; IV Piggyback:50] Out: 400 [Urine:400] Intake/Output this shift:    General appearance: alert and no distress Eyes: no scleral icterus Throat: oropharynx moist without erythema Resp: clear to auscultation bilaterally Cardio: regular rate and rhythm GI: soft, non-tender; bowel sounds normal; no masses,  no organomegaly Extremities: no clubbing, cyanosis or edema GU: decreased induration right testicle; minimal tenderness.  Persistent scrotal edema   Lab Results:  Recent Labs  11/06/12 0357 11/07/12 0335  NA 125* 122*  K 3.6 3.7  CL 90* 89*  CO2 29 26  GLUCOSE 185* 205*  BUN 22 17  CREATININE 1.37* 1.16  CALCIUM 9.0 8.1*    Recent Labs  11/04/12 2010  AST 31  ALT 35  ALKPHOS 96  BILITOT 0.5  PROT 6.9  ALBUMIN 3.1*    Recent Labs  11/06/12 0357 11/07/12 0335  WBC 12.3* 9.1  HGB 12.4* 10.5*  HCT 35.6* 31.1*  MCV 86.8 87.4  PLT 266 228   Lab Results  Component Value Date   INR 3.19* 11/07/2012   INR 2.81* 11/06/2012   INR 3.24* 11/05/2012    Studies/Results: No results found.   Medications: Scheduled: . beta carotene w/minerals  1 tablet Oral QHS  . budesonide-formoterol  2 puff Inhalation BID  . calcium-vitamin D  1 tablet Oral QHS  . ceFEPime (MAXIPIME) IV  1 g Intravenous Q12H  . cholecalciferol  2,000 Units Oral Daily  . docusate sodium  100 mg Oral Daily  . finasteride  5 mg Oral  Daily  . fluticasone  1 spray Each Nare Daily  . levothyroxine  75 mcg Oral QAC breakfast  . Linaclotide  145 mcg Oral Daily  . losartan  100 mg Oral Daily  . multivitamin with minerals  1 tablet Oral Daily  . omega-3 acid ethyl esters  1 g Oral q morning - 10a  . pantoprazole  40 mg Oral Daily  . phenazopyridine  100 mg Oral TID WC  . tamsulosin  0.4 mg Oral QPC supper  . Warfarin - Pharmacist Dosing Inpatient   Does not apply q1800   Continuous:   Assessment/Plan: Principal Problem:  1. Orchitis of right testicle with scrotal edema- slowly improving with Cefepime and elevation. Will continue IV antibiotics to complete 2 week course.  Place PICC line today for home IV antibiotics. Active Problems:  2. BPH with urinary obstruction- plans to see Dr. Tammi Klippel on 9/28 to discuss robotic prostatectomy.  3. Dysuria- improved.  Continue Pyridium as needed. 4. Diarrhea- no recurrence. C.diff negative.  5. Hyponatremia- slight decrease in Na overnight despite being off diuretics.  Suspect SIADH.  Will continue to hold diuretics and place on 1L fluid restriction.  Check Urine Na, Urine Osm, serum osm and monitor.   6. HYPERTENSION- continue home meds- Lasix on hold due to hypotnatremia  7. Atrial fibrillation s/p Pacemaker-[employer]- INR supratherapeutic due to antibiotics.  8. Hyperglycemia- check A1C.   9. Disposition- anticipate discharge tomorrow if continued improvement and Na stable. PICC line today.   LOS: 3 days   Davidson Palmieri,W [name] 11/07/2012, 7:50 AM

## 2012-11-08 DIAGNOSIS — R7303 Prediabetes: Secondary | ICD-10-CM | POA: Diagnosis present

## 2012-11-08 LAB — BASIC METABOLIC PANEL
BUN: 17 mg/dL (ref 6–23)
Calcium: 8.5 mg/dL (ref 8.4–10.5)
Creatinine, Ser: 1.2 mg/dL (ref 0.50–1.35)
GFR calc Af Amer: 60 mL/min — ABNORMAL LOW (ref >90)
GFR calc non Af Amer: 52 mL/min — ABNORMAL LOW (ref >90)
Glucose, Bld: 137 mg/dL — ABNORMAL HIGH (ref 70–99)

## 2012-11-08 LAB — OSMOLALITY, URINE: Osmolality, Ur: 187 mOsm/kg — ABNORMAL LOW (ref 390–1090)

## 2012-11-08 LAB — CBC
HCT: 31.2 % — ABNORMAL LOW (ref 39.0–52.0)
MCH: 30.3 pg (ref 26.0–34.0)
MCHC: 34.6 g/dL (ref 30.0–36.0)
MCV: 87.4 fL (ref 78.0–100.0)
RDW: 12.9 % (ref 11.5–15.5)

## 2012-11-08 MED ORDER — SODIUM CHLORIDE 0.9 % IJ SOLN: 10.0000 mL | INTRAMUSCULAR | Status: DC | PRN

## 2012-11-08 MED ORDER — HYDROCODONE-ACETAMINOPHEN 5-325 MG PO TABS: 1.0000 | ORAL_TABLET | Freq: Four times a day (QID) | ORAL | Status: DC | PRN

## 2012-11-08 MED ORDER — PHENAZOPYRIDINE HCL 100 MG PO TABS: 100.0000 mg | ORAL_TABLET | Freq: Three times a day (TID) | ORAL | Status: DC

## 2012-11-08 MED ORDER — WARFARIN SODIUM 3 MG PO TABS: 1.5000 mg | ORAL_TABLET | Freq: Every day | ORAL | Status: DC

## 2012-11-08 MED ORDER — HEPARIN SOD (PORK) LOCK FLUSH 100 UNIT/ML IV SOLN: 250.0000 [IU] | INTRAVENOUS | Status: DC | PRN

## 2012-11-08 MED ORDER — DEXTROSE 5 % IV SOLN: 1.0000 g | Freq: Two times a day (BID) | INTRAVENOUS | Status: AC

## 2012-11-08 NOTE — Progress Notes (Signed)
Peripherally Inserted Central Catheter/Midline Placement  The IV Nurse has discussed with the patient and/or persons authorized to consent for the patient, the purpose of this procedure and the potential benefits and risks involved with this procedure.  The benefits include less needle sticks, lab draws from the catheter and patient may be discharged home with the catheter.  Risks include, but not limited to, infection, bleeding, blood clot (thrombus formation), and puncture of an artery; nerve damage and irregular heat beat.  Alternatives to this procedure were also discussed.  PICC/Midline Placement Documentation        Henderson Baltimore 11/08/2012, 8:54 AM

## 2012-11-08 NOTE — Discharge Summary (Signed)
DISCHARGE SUMMARY  JIOVANNI SILMON  MR#: [id number]  DOB:1922-05-18  Date of Admission: 11/04/2012 Date of Discharge: 11/08/2012  Attending Physician:Desirai Traxler,W [name]  Patient's [id number] Nathaneil Canary, MD  Consults:Treatment Team:  Fredricka Bonine, MD - Urology  Discharge Diagnoses: Principal Problem:   Orchitis of right testicle with Scrotal edema Active Problems:   Acute hyponatremia   BPH with urinary obstruction    Chronic combined systolic and diastolic CHF (congestive heart failure)- EF 40%   Hypertension   Atrial fibrillation   Long-term (current) use of anticoagulants  Past Medical History CHF- EF 40% Asthma  Afib on coumadin  HTN  IFG/borderline DM2  Microalbuminuria  Hypothyroid  Hyponatremia secondary to Maxzide  BPH  Obesity  Long-term anticoagulant therapy  Mitral valve d/o  SVT  Chronic LBP  Chronic sinusitis  Gout  Syncope  Osteoporosis  BPH  Hyperplastic polyps (5/03)  Past Surgical History  Pacemaker 2005  Ablation 1990  Inguinal hernia repair 1969  Tympan. tubes  Discharge Medications:   Medication List    STOP taking these medications       furosemide 20 MG tablet  Commonly known as:  LASIX     ibuprofen 200 MG tablet  Commonly known as:  ADVIL,MOTRIN      TAKE these medications       alendronate 70 MG tablet  Commonly known as:  FOSAMAX  Take 70 mg by mouth every 7 (seven) days. Take with a full glass of water on an empty stomach.  Taken on Wednesdays.     beta carotene w/minerals tablet  Take 1 tablet by mouth at bedtime.     budesonide-formoterol 160-4.5 MCG/ACT inhaler  Commonly known as:  SYMBICORT  Inhale 2 puffs into the lungs 2 (two) times daily.     calcium-vitamin D 500-200 MG-UNIT per tablet  Commonly known as:  OSCAL WITH D  Take 1 tablet by mouth at bedtime.     dextrose 5 % SOLN 50 mL with ceFEPIme 1 G SOLR 1 g  Inject 1 g into the vein every 12 (twelve) hours.     docusate sodium 100 MG capsule   Commonly known as:  COLACE  Take 100 mg by mouth 2 (two) times daily as needed for constipation.     esomeprazole 20 MG capsule  Commonly known as:  NEXIUM  Take 20 mg by mouth at bedtime.     finasteride 5 MG tablet  Commonly known as:  PROSCAR  Take 5 mg by mouth daily.     flurazepam 30 MG capsule  Commonly known as:  DALMANE  Take 30 mg by mouth at bedtime as needed for sleep.     HYDROcodone-acetaminophen 5-325 MG per tablet  Commonly known as:  NORCO/VICODIN  Take 1-2 tablets by mouth every 6 (six) hours as needed for pain.     levothyroxine 75 MCG tablet  Commonly known as:  SYNTHROID, LEVOTHROID  Take 75 mcg by mouth daily before breakfast.     LINZESS 145 MCG Caps capsule  Generic drug:  Linaclotide  Take 145 mcg by mouth daily.     losartan 100 MG tablet  Commonly known as:  COZAAR  Take 100 mg by mouth daily.     mometasone 50 MCG/ACT nasal spray  Commonly known as:  NASONEX  Place 2 sprays into the nose daily as needed (for congestion).     multivitamin with minerals Tabs tablet  Take 1 tablet by mouth daily.     omega-3 acid  ethyl esters 1 G capsule  Commonly known as:  LOVAZA  Take 1 g by mouth every morning.     phenazopyridine 100 MG tablet  Commonly known as:  PYRIDIUM  Take 1 tablet (100 mg total) by mouth 3 (three) times daily with meals.     silodosin 8 MG Caps capsule  Commonly known as:  RAPAFLO  Take 8 mg by mouth at bedtime.     traMADol 50 MG tablet  Commonly known as:  ULTRAM  Take 50 mg by mouth every 6 (six) hours as needed for pain.     Vitamin D3 1000 UNITS Caps  Take 2,000 Units by mouth daily.     warfarin 3 MG tablet  Commonly known as:  COUMADIN  Take 0.5-1 tablets (1.5-3 mg total) by mouth at bedtime. Take 1/2 tab on Monday, Wednesday, and Friday and 1 tab all other days        Hospital Procedures: Dg Chest 2 View  11/07/2012   *RADIOLOGY REPORT*  Clinical Data: Congestive heart failure.  CHEST - 2 VIEW   Comparison: Jul 04, 2012  Findings: There are small bilateral pleural effusions, right greater than left.  There is no pulmonary edema.  No focal pneumonia is identified.  The mediastinal contour and cardiac silhouette are stable.  Dual lead cardiac pacemaker is unchanged. The soft tissues and osseous structures are stable.  IMPRESSION: No evidence of congestive heart failure.  Small bilateral pleural effusions, right greater than left.   Original Report Authenticated By: Abelardo Diesel, M.D.   US Scrotum  11/04/2012   *RADIOLOGY REPORT*  Clinical Data: Right scrotal pain and swelling  SCROTAL ULTRASOUND DOPPLER ULTRASOUND OF THE TESTICLES  Technique:  Complete ultrasound examination of the testicles, epididymis, and other scrotal structures was performed.  Color and spectral Doppler ultrasound were also utilized to evaluate blood flow to the testicles.  Comparison:  None.  Findings:  Right testis:  Measures 4.2 x 2.9 x 2.6 cm.  There is hyperemia and increased echogenicity of the right testis.  No right testicular mass.  Color Doppler flow with arterial and venous waveforms noted.  Left testis:  Measures 3.2 x 1.8 x 1.4 cm.  There is color Doppler flow with arterial and venous waveforms.  Asymmetric enlargement and increased heterogeneity of the right epididymis is noted.  The right epididymis measures 1.7 cm. The left epididymis appears normal.The testicles are symmetric in size and echogenicity.  The complex heterogeneous appearing right hydrocele is noted. The hydrocelemarrow has a "lace-like appearance suggesting the presence of blood products.  No varicocele noted.  Bilateral scrotal wall thickening is identified.  IMPRESSION:  1.  No evidence for testicular mass or torsion. 2.  Right epididymal orchitis/epididymitis. 3.  Progression of right hydrocele which now is diffusely heterogeneous in appearance and may be complicated by hemorrhage.   Original Report Authenticated By: Kerby Moors, M.D.   [place] Art/ven  Flow Abd Pelv Doppler  11/04/2012   *RADIOLOGY REPORT*  Clinical Data: Right scrotal pain and swelling  SCROTAL ULTRASOUND DOPPLER ULTRASOUND OF THE TESTICLES  Technique:  Complete ultrasound examination of the testicles, epididymis, and other scrotal structures was performed.  Color and spectral Doppler ultrasound were also utilized to evaluate blood flow to the testicles.  Comparison:  None.  Findings:  Right testis:  Measures 4.2 x 2.9 x 2.6 cm.  There is hyperemia and increased echogenicity of the right testis.  No right testicular mass.  Color Doppler flow with arterial and venous waveforms  noted.  Left testis:  Measures 3.2 x 1.8 x 1.4 cm.  There is color Doppler flow with arterial and venous waveforms.  Asymmetric enlargement and increased heterogeneity of the right epididymis is noted.  The right epididymis measures 1.7 cm. The left epididymis appears normal.The testicles are symmetric in size and echogenicity.  The complex heterogeneous appearing right hydrocele is noted. The hydrocelemarrow has a "lace-like appearance suggesting the presence of blood products.  No varicocele noted.  Bilateral scrotal wall thickening is identified.  IMPRESSION:  1.  No evidence for testicular mass or torsion. 2.  Right epididymal orchitis/epididymitis. 3.  Progression of right hydrocele which now is diffusely heterogeneous in appearance and may be complicated by hemorrhage.   Original Report Authenticated By: Kerby Moors, M.D.     History of Present Illness: Mr. Doskocil is an 79 year old white male with a history of congestive heart failure (ejection fraction 40%), atrial fibrillation on anticoagulation, and severe BPH who presented to the office on 9/8 for further evaluation of scrotal edema. The patient has been having ongoing neurologic issues managed by Dr. Junious Silk. He has been treated with oral antibiotics for recurrent prostatitis. Recent urine culture grew Pseudomonas sensitive to Zosyn, cefepime, ceftaz,  imipenem, and gentamicin.  Initially, he was treated with Suprax. He presented emergency department on 8/31 for acute onset right testicular pain and swelling. He received a dose of Rocephin and was discharged on Cedinir per urology recommendations.  His testicular pain and scrotal edema or sensory called urology who recommended that he take NSAIDs. Because he is on Coumadin he called [name] to verify that this was okay. [name] evaluated him in the office and found him to have massive scrotal edema. He called me the patient was seen promptly my office and admitted for further management of right orchitis and severe scrotal edema.  Hospital Course: Mr. Steeno was admitted to a medical bed. His antibiotics were changed to cefepime based on recent urine culture growing Pseudomonas. A scrotal ultrasound was obtained which revealed right epido-orchitis with progression of a right hydrocele which is now diffusely heterogeneous and may be complicated by hemorrhage (he is on Coumadin therapy). His Coumadin was held due to slightly supratherapeutic INR which drifted down to 2.5 prior to discharge. Urology evaluated the patient and did discuss the option of orchiectomy. However, given improvement with elevation and IV antibiotics we elected to continue with medical therapy. Ultimately, this is related to his massive BPH (greater than 400 g)-he is considering robotic prostatectomy and will meet with Dr. Tammi Klippel on 9/23 to discuss further.  The patient does have a history of hyponatremia and his sodium was low on admission. It drifted down to a nadir of 122. 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 He was placed on free water restriction and diuretics were held with improvement up to 129 on the day of discharge.  He has no evidence of volume overload in the setting of his known congestive heart failure (ejection fraction 40%). No changes were made otherwise in his cardiac  regimen-medical therapy and will be deferred to his cardiologist as an outpatient.  He was tachycardic initially on admission but this has improved with improved pain control.  He will be restarted on Coumadin at a decreased dose due to interaction with his antibiotics. We'll need an INR one week.  At this point, the patient's symptoms are slowly improving. He will be continued on cefepime by midline (being placed this morning) to complete  a two-week course on 9/22.  Arrangements have been made for home health to administer this.    Day of Discharge Exam BP 145/68  Pulse 92  Temp(Src) 98 F (36.7 C) (Oral)  Resp 18  Ht 6' (1.829 m)  Wt 95.255 kg (210 lb)  BMI 28.47 kg/m2  SpO2 96%  Physical Exam: General appearance: alert and no distress Eyes: no scleral icterus Throat: oropharynx moist without erythema Resp: clear to auscultation bilaterally Cardio: regular rate and rhythm GI: soft, non-tender; bowel sounds normal; no masses,  no organomegaly Extremities: no clubbing, cyanosis or edema GU: decreasing scrotal edema with swelling around glans; No erythema or warmth; right testicle is markedly enlarged, firm with decreased induration and tenderness  Discharge Labs:  Recent Labs  11/07/12 0335 11/08/12 0402  NA 122* 129*  K 3.7 4.1  CL 89* 96  CO2 26 27  GLUCOSE 205* 137*  BUN 17 17  CREATININE 1.16 1.20  CALCIUM 8.1* 8.5    Recent Labs  11/07/12 0335 11/08/12 0402  WBC 9.1 10.0  HGB 10.5* 10.8*  HCT 31.1* 31.2*  MCV 87.4 87.4  PLT 228 258   Lab Results  Component Value Date   INR 2.55* 11/08/2012   INR 3.19* 11/07/2012   INR 2.81* 11/06/2012   Urine sodium 28 Urine Osmolality 187 Serum Osmolality 274 proBNP- 6681 A1C 6.6   Discharge instructions:     Discharge Orders   Future Orders Complete By Expires   Diet - low sodium heart healthy  As directed    Comments:     Free Water Restriction 1 liter   Discharge instructions  As directed    Comments:      Elevate scrotum. Call if fever above 101.5, increased pain or scrotal swelling. Limit free water intake to 1 L per day. Call if increased shortness of breath or edema.   Increase activity slowly  As directed       Disposition: To home with home health for IV antibiotics  Follow-up Appts: Follow-up with Dr. Brigitte Pulse at [facility] in 1 week. Follow-up with Dr. Tammi Klippel (urology) on 9/23 as scheduled  Condition on Discharge: Stable  Tests Needing Follow-up:  PT/INR  Time with discharge activities: 40 minutes  Signed: Feliberto Stockley,W [name] 11/08/2012, 7:28 AM

## 2012-11-08 NOTE — Progress Notes (Signed)
Patient received discharge instructions with daughter at bedside. Both verbalized understanding and had no further questions. Patient prescriptions given and instructions given as to when to call the MD. Patient belongings packed and patient to be transported via wheelchair to be discharged home.

## 2012-11-17 IMAGING — CT CT HEAD W/O CM
1 of 2 series · 13 of 30 positions shown, 17 images · non-contrast
Comparison: 07/06/2008.

CLINICAL DATA: Dizziness, blurred vision, unsteady gait, headache.

CT HEAD WITHOUT CONTRAST
TECHNIQUE: Contiguous axial images were obtained from the base of
the skull through the vertex without contrast.

[Series 2: brain · axial · 0.49mm/px · z∈[+165,+297]mm · 13 of 32 slices shown, 17 images]
[im 3/32  brain]
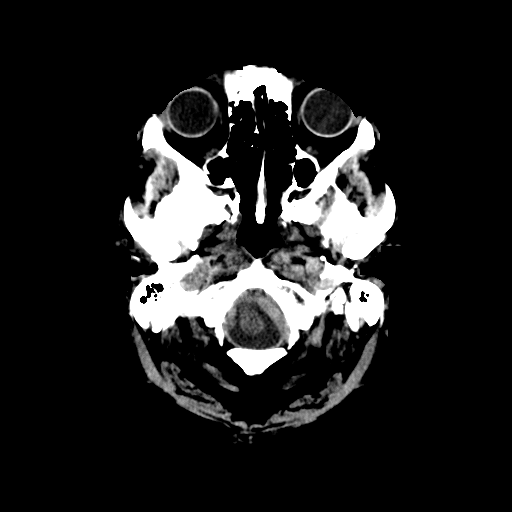
[im 3/32  bone]
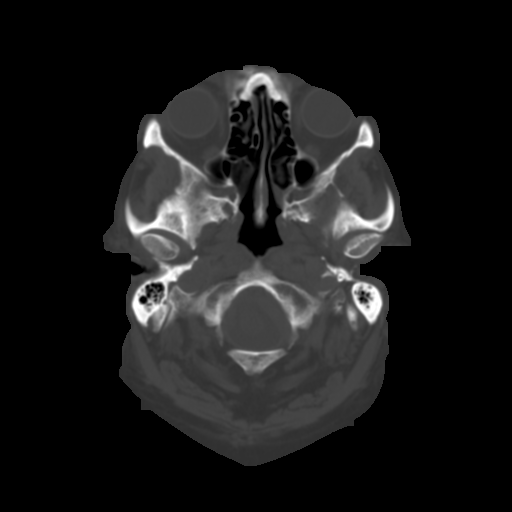
[im 5/32  brain]
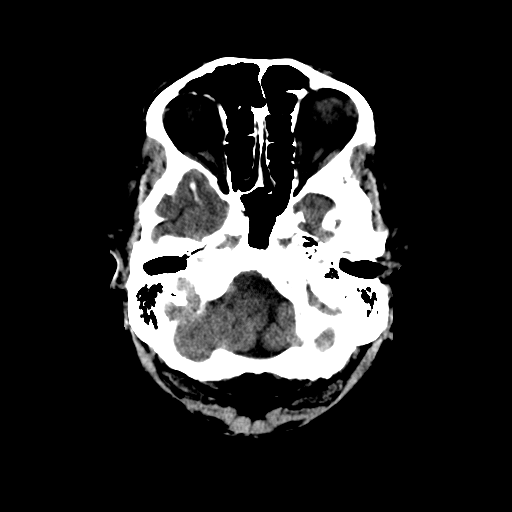
[im 7/32  brain]
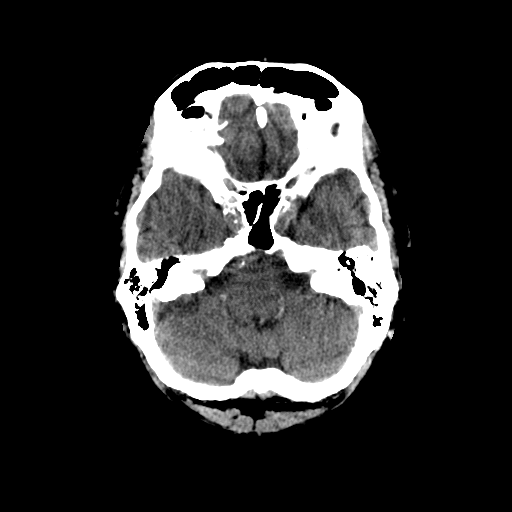
[im 9/32  brain]
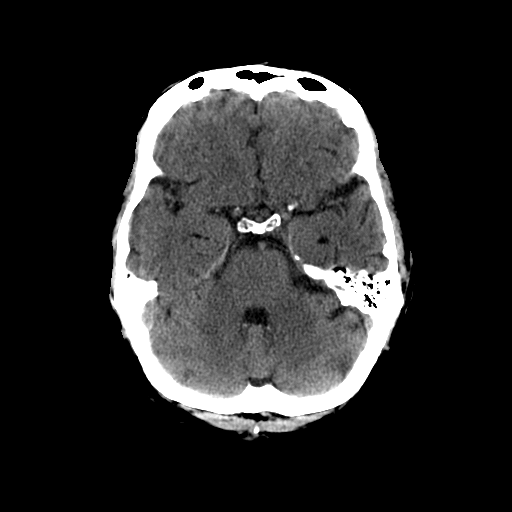
[im 12/32  brain]
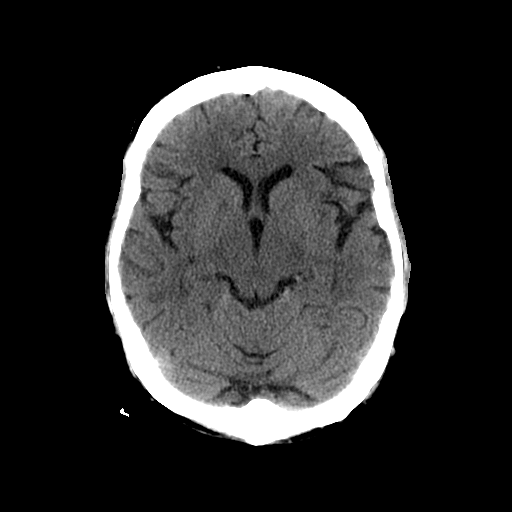
[im 12/32  bone]
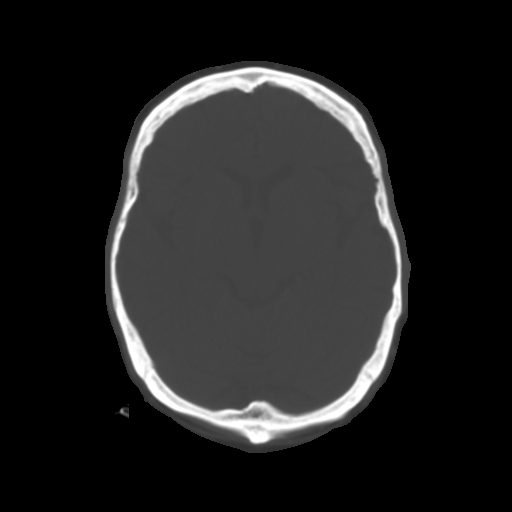
[im 14/32  brain]
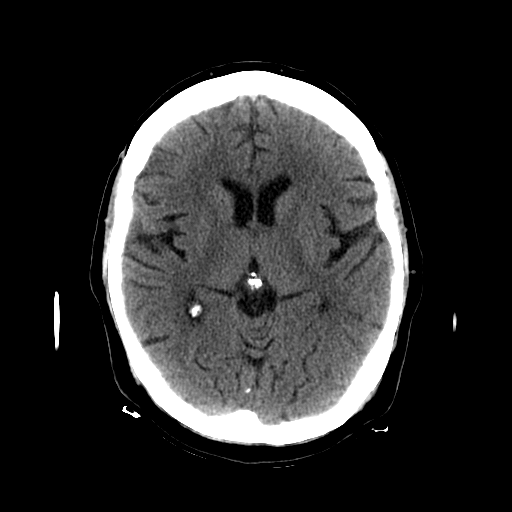
[im 16/32  brain]
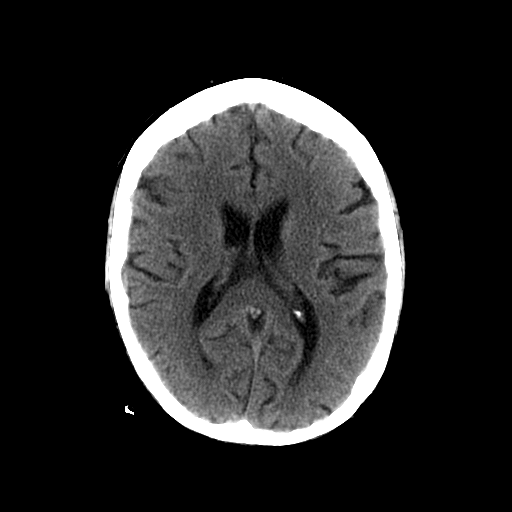
[im 18/32  brain]
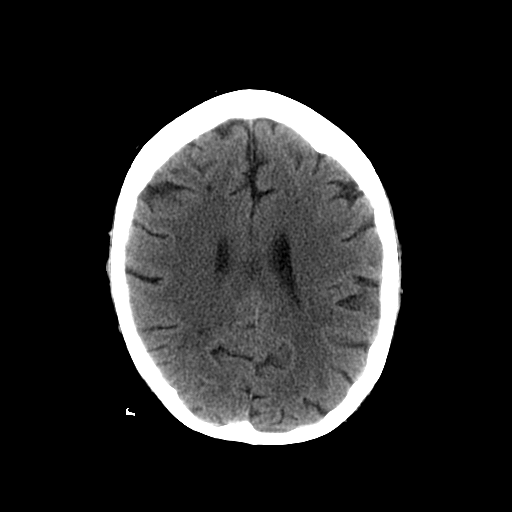
[im 20/32  brain]
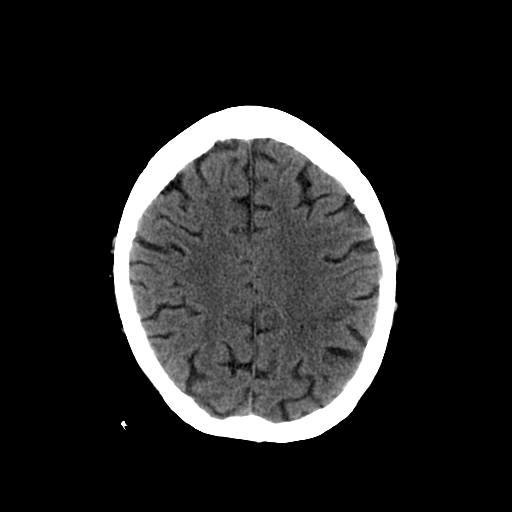
[im 20/32  bone]
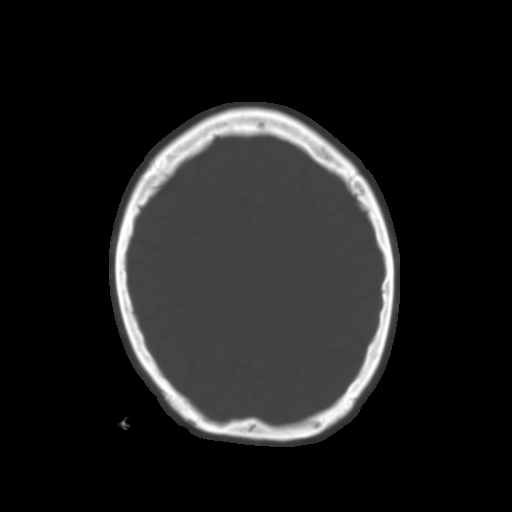
[im 23/32  brain]
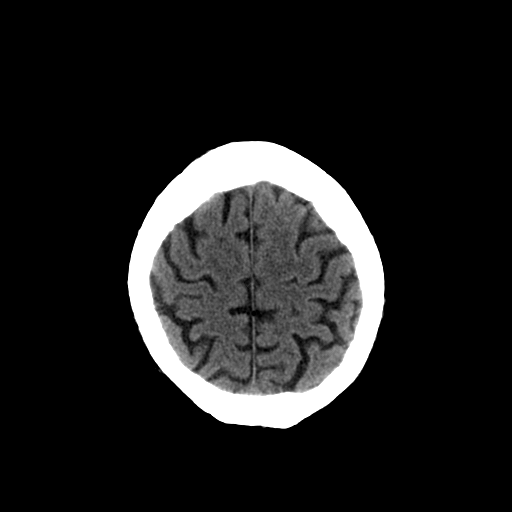
[im 25/32  brain]
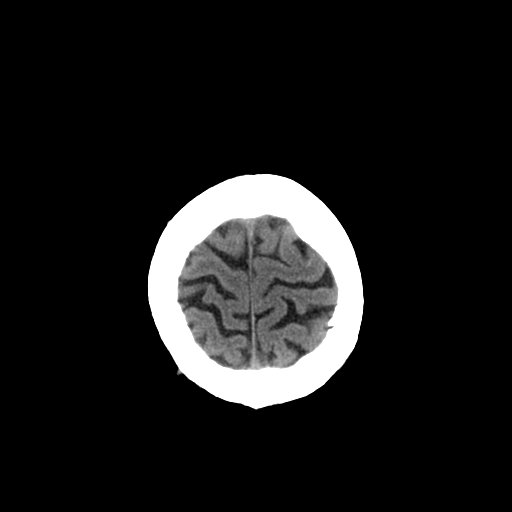
[im 27/32  brain]
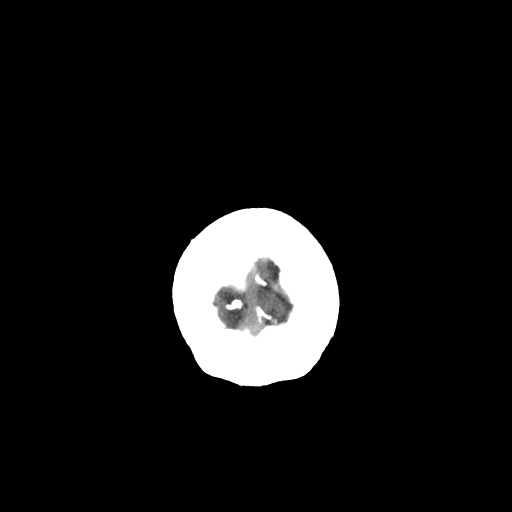
[im 29/32  brain]
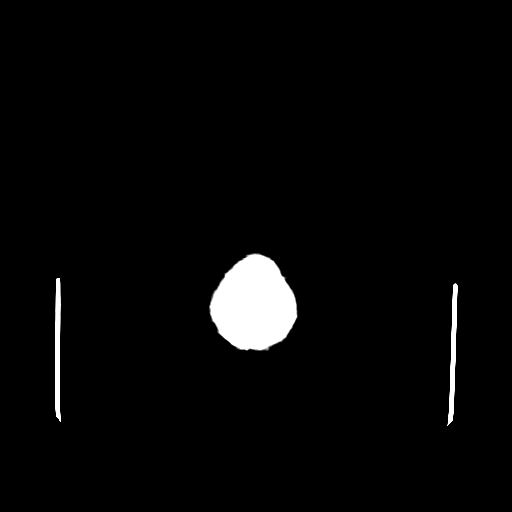
[im 29/32  bone]
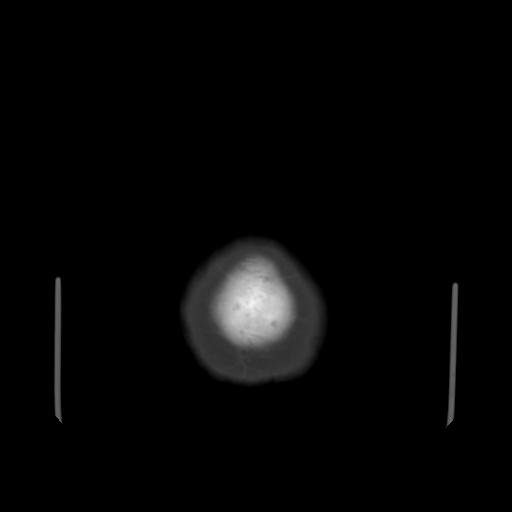

[13 of 30 positions shown; findings below may reference images not displayed]

FINDINGS: No intraparenchymal hemorrhage or extra-axial fluid
collection.  No mass lesion, mass effect, or midline shift.

No CT evidence of acute infarction.

Mild age appropriate atrophy.  The ventricles are normal in size.

Intracranial atherosclerosis.

The visualized paranasal sinuses and mastoids are unopacified.

No evidence of calvarial abnormality.
IMPRESSION: No acute intracranial abnormality.

## 2012-11-19 ENCOUNTER — Encounter: Payer: Self-pay | Admitting: Internal Medicine

## 2012-11-28 ENCOUNTER — Other Ambulatory Visit: Payer: Self-pay | Admitting: Urology

## 2012-12-03 ENCOUNTER — Other Ambulatory Visit (HOSPITAL_COMMUNITY): Payer: Self-pay | Admitting: Urology

## 2012-12-03 ENCOUNTER — Encounter (HOSPITAL_COMMUNITY): Payer: Self-pay | Admitting: Pharmacy Technician

## 2012-12-03 NOTE — Progress Notes (Signed)
11-04-12 ekg epic lov dr Caryl Comes 07-18-11 epic Chest xray 11-07-2012 abnoramal will repeat pre op 12-04-12 Echo 09-2012 dr Wynonia Lawman on chart Last pacer check note 11-04-12 dr Wynonia Lawman on chart lov note dr Wynonia Lawman cardiology 11-04-12 on chart

## 2012-12-03 NOTE — Patient Instructions (Addendum)
[place]  12/03/2012   Your procedure is scheduled on: 12-11-2012   [facility]  Report to [place] at 630 AM.  Call this number if you have problems the morning of surgery [phone]   Remember:   Do not eat food or drink liquids :After Midnight.  Tuesday night     Take these medicines the morning of surgery with A SIP OF WATER:  Levothyroxine             Use Symbicort                      May take TRAMADOL IF NEEDED                                                                   SEE [place] PREPARING FOR SURGERY SHEET             You may not have any metal on your body including hair pins and piercings  Do not wear jewelry, make-up.  Do not wear lotions, powders, or perfumes. You may wear deodorant.   Men may shave face and neck.  Do not bring valuables to the hospital. [place].  Contacts, dentures or bridgework may not be worn into surgery.  Leave suitcase in the car. After surgery it may be brought to your room.  For patients admitted to the hospital, checkout time is 11:00 AM the day of discharge.   Patients discharged the day of surgery will not be allowed to drive home.  Name and phone number of your driver:wife  [name]  Special Instructions: N/A   Please read over the following fact sheets that you were given:   Call Benard Rink RN pre op nurse if needed [phone]    [place].  PATIENT SIGNATURE___________________________________________  NURSE SIGNATURE_____________________________________________

## 2012-12-04 ENCOUNTER — Encounter (HOSPITAL_COMMUNITY): Payer: Self-pay

## 2012-12-04 ENCOUNTER — Ambulatory Visit (HOSPITAL_COMMUNITY)
Admission: RE | Admit: 2012-12-04 | Discharge: 2012-12-04 | Disposition: A | Discharge status: Home / Self Care | Payer: Medicare Other | Source: Ambulatory Visit | Attending: Urology | Admitting: Urology

## 2012-12-04 ENCOUNTER — Encounter (HOSPITAL_COMMUNITY)
Admission: RE | Admit: 2012-12-04 | Discharge: 2012-12-04 | Disposition: A | Discharge status: Home / Self Care | Payer: Medicare Other | Source: Ambulatory Visit | Attending: Urology | Admitting: Urology

## 2012-12-04 DIAGNOSIS — I10 Essential (primary) hypertension: Secondary | ICD-10-CM | POA: Insufficient documentation

## 2012-12-04 DIAGNOSIS — I517 Cardiomegaly: Secondary | ICD-10-CM | POA: Insufficient documentation

## 2012-12-04 DIAGNOSIS — Z01818 Encounter for other preprocedural examination: Secondary | ICD-10-CM | POA: Insufficient documentation

## 2012-12-04 DIAGNOSIS — Z01812 Encounter for preprocedural laboratory examination: Secondary | ICD-10-CM | POA: Insufficient documentation

## 2012-12-04 HISTORY — DX: Orchitis: N45.2

## 2012-12-04 HISTORY — DX: Unspecified asthma, uncomplicated: J45.909

## 2012-12-04 HISTORY — DX: Long term (current) use of anticoagulants: Z79.01

## 2012-12-04 LAB — BASIC METABOLIC PANEL
BUN: 23 mg/dL (ref 6–23)
CO2: 31 mEq/L (ref 19–32)
Calcium: 9.4 mg/dL (ref 8.4–10.5)
Chloride: 93 mEq/L — ABNORMAL LOW (ref 96–112)
Creatinine, Ser: 1.22 mg/dL (ref 0.50–1.35)
GFR calc non Af Amer: 51 mL/min — ABNORMAL LOW (ref >90)
Glucose, Bld: 111 mg/dL — ABNORMAL HIGH (ref 70–99)
Sodium: 132 mEq/L — ABNORMAL LOW (ref 135–145)

## 2012-12-04 LAB — APTT: aPTT: 45 seconds — ABNORMAL HIGH (ref 24–37)

## 2012-12-04 LAB — CBC
HCT: 35.9 % — ABNORMAL LOW (ref 39.0–52.0)
MCH: 30.5 pg (ref 26.0–34.0)
MCV: 87.6 fL (ref 78.0–100.0)
RBC: 4.1 MIL/uL — ABNORMAL LOW (ref 4.22–5.81)
RDW: 13.3 % (ref 11.5–15.5)
WBC: 7.5 10*3/uL (ref 4.0–10.5)

## 2012-12-04 NOTE — Progress Notes (Signed)
12/04/12 1421  OBSTRUCTIVE SLEEP APNEA  Have you ever been diagnosed with sleep apnea through a sleep study? No  Do you snore loudly (loud enough to be heard through closed doors)?  1  Do you often feel tired, fatigued, or sleepy during the daytime? 0  Has anyone observed you stop breathing during your sleep? 0  Do you have, or are you being treated for high blood pressure? 1  BMI more than 35 kg/m2? 0  Age over 77 years old? 1  Neck circumference greater than 40 cm/18 inches? 0  Gender: 1  Obstructive Sleep Apnea Score 4  Score 4 or greater  Results sent to PCP

## 2012-12-04 NOTE — Progress Notes (Signed)
States last dose COUMADIN will be 12/05/12

## 2012-12-10 MED ORDER — GENTAMICIN SULFATE 40 MG/ML IJ SOLN
410.0000 mg | INTRAVENOUS | Status: AC
  Administered 2012-12-11: 410 mg via INTRAVENOUS
  Filled 2012-12-10: qty 10.25

## 2012-12-11 ENCOUNTER — Ambulatory Visit (HOSPITAL_COMMUNITY): Payer: Medicare Other | Admitting: Anesthesiology

## 2012-12-11 ENCOUNTER — Ambulatory Visit (HOSPITAL_COMMUNITY)
Admission: RE | Admit: 2012-12-11 | Discharge: 2012-12-11 | Disposition: A | Discharge status: Home / Self Care | Payer: Medicare Other | Source: Ambulatory Visit | Attending: Urology | Admitting: Urology

## 2012-12-11 ENCOUNTER — Encounter (HOSPITAL_COMMUNITY)
Admission: RE | Disposition: A | Discharge status: Home / Self Care | Payer: Self-pay | Source: Ambulatory Visit | Attending: Urology

## 2012-12-11 ENCOUNTER — Encounter (HOSPITAL_COMMUNITY): Payer: Self-pay | Admitting: *Deleted

## 2012-12-11 ENCOUNTER — Encounter (HOSPITAL_COMMUNITY): Payer: Medicare Other | Admitting: Anesthesiology

## 2012-12-11 DIAGNOSIS — I4891 Unspecified atrial fibrillation: Secondary | ICD-10-CM | POA: Insufficient documentation

## 2012-12-11 DIAGNOSIS — I5042 Chronic combined systolic (congestive) and diastolic (congestive) heart failure: Secondary | ICD-10-CM | POA: Insufficient documentation

## 2012-12-11 DIAGNOSIS — Z950 Presence of cardiac pacemaker: Secondary | ICD-10-CM | POA: Insufficient documentation

## 2012-12-11 DIAGNOSIS — N401 Enlarged prostate with lower urinary tract symptoms: Secondary | ICD-10-CM | POA: Insufficient documentation

## 2012-12-11 DIAGNOSIS — E039 Hypothyroidism, unspecified: Secondary | ICD-10-CM | POA: Insufficient documentation

## 2012-12-11 DIAGNOSIS — Z7901 Long term (current) use of anticoagulants: Secondary | ICD-10-CM | POA: Insufficient documentation

## 2012-12-11 DIAGNOSIS — K219 Gastro-esophageal reflux disease without esophagitis: Secondary | ICD-10-CM | POA: Insufficient documentation

## 2012-12-11 DIAGNOSIS — I10 Essential (primary) hypertension: Secondary | ICD-10-CM | POA: Insufficient documentation

## 2012-12-11 DIAGNOSIS — Z87440 Personal history of urinary (tract) infections: Secondary | ICD-10-CM | POA: Insufficient documentation

## 2012-12-11 DIAGNOSIS — N3289 Other specified disorders of bladder: Secondary | ICD-10-CM | POA: Insufficient documentation

## 2012-12-11 DIAGNOSIS — E119 Type 2 diabetes mellitus without complications: Secondary | ICD-10-CM | POA: Insufficient documentation

## 2012-12-11 DIAGNOSIS — N138 Other obstructive and reflux uropathy: Secondary | ICD-10-CM | POA: Insufficient documentation

## 2012-12-11 DIAGNOSIS — R339 Retention of urine, unspecified: Secondary | ICD-10-CM | POA: Insufficient documentation

## 2012-12-11 DIAGNOSIS — N390 Urinary tract infection, site not specified: Secondary | ICD-10-CM | POA: Insufficient documentation

## 2012-12-11 DIAGNOSIS — M810 Age-related osteoporosis without current pathological fracture: Secondary | ICD-10-CM | POA: Insufficient documentation

## 2012-12-11 DIAGNOSIS — I509 Heart failure, unspecified: Secondary | ICD-10-CM | POA: Insufficient documentation

## 2012-12-11 HISTORY — PX: INSERTION OF SUPRAPUBIC CATHETER: SHX5870

## 2012-12-11 HISTORY — PX: CYSTOSCOPY: SHX5120

## 2012-12-11 LAB — PROTIME-INR
INR: 1.17 (ref 0.00–1.49)
Prothrombin Time: 14.7 seconds (ref 11.6–15.2)

## 2012-12-11 SURGERY — INSERTION, SUPRAPUBIC CATHETER
Anesthesia: General | Wound class: Clean Contaminated

## 2012-12-11 MED ORDER — FENTANYL CITRATE 0.05 MG/ML IJ SOLN
INTRAMUSCULAR | Status: DC | PRN
  Administered 2012-12-11: 25 ug via INTRAVENOUS
  Administered 2012-12-11: 50 ug via INTRAVENOUS
  Administered 2012-12-11: 25 ug via INTRAVENOUS

## 2012-12-11 MED ORDER — LIDOCAINE HCL (PF) 2 % IJ SOLN
INTRAMUSCULAR | Status: DC | PRN
  Administered 2012-12-11: 75 mg

## 2012-12-11 MED ORDER — OXYCODONE HCL 5 MG/5ML PO SOLN
5.0000 mg | Freq: Once | ORAL | Status: AC | PRN
  Filled 2012-12-11: qty 5

## 2012-12-11 MED ORDER — MEPERIDINE HCL 50 MG/ML IJ SOLN: 6.2500 mg | INTRAMUSCULAR | Status: DC | PRN

## 2012-12-11 MED ORDER — PROMETHAZINE HCL 25 MG/ML IJ SOLN: 6.2500 mg | INTRAMUSCULAR | Status: DC | PRN

## 2012-12-11 MED ORDER — STERILE WATER FOR IRRIGATION IR SOLN
Status: DC | PRN
  Administered 2012-12-11: 3000 mL

## 2012-12-11 MED ORDER — SENNOSIDES-DOCUSATE SODIUM 8.6-50 MG PO TABS: 1.0000 | ORAL_TABLET | Freq: Two times a day (BID) | ORAL | Status: DC

## 2012-12-11 MED ORDER — OXYCODONE-ACETAMINOPHEN 5-325 MG PO TABS: 1.0000 | ORAL_TABLET | Freq: Four times a day (QID) | ORAL | Status: DC | PRN

## 2012-12-11 MED ORDER — LACTATED RINGERS IV SOLN
INTRAVENOUS | Status: DC | PRN
  Administered 2012-12-11: 08:00:00 via INTRAVENOUS

## 2012-12-11 MED ORDER — ONDANSETRON HCL 4 MG/2ML IJ SOLN
INTRAMUSCULAR | Status: DC | PRN
  Administered 2012-12-11: 4 mg via INTRAMUSCULAR

## 2012-12-11 MED ORDER — OXYCODONE HCL 5 MG PO TABS
5.0000 mg | ORAL_TABLET | Freq: Once | ORAL | Status: AC | PRN
  Administered 2012-12-11: 5 mg via ORAL
  Filled 2012-12-11: qty 1

## 2012-12-11 MED ORDER — HYDROMORPHONE HCL PF 1 MG/ML IJ SOLN: 0.2500 mg | INTRAMUSCULAR | Status: DC | PRN

## 2012-12-11 MED ORDER — PROPOFOL 10 MG/ML IV BOLUS
INTRAVENOUS | Status: DC | PRN
  Administered 2012-12-11: 40 mg via INTRAVENOUS
  Administered 2012-12-11: 160 mg via INTRAVENOUS

## 2012-12-11 MED ORDER — LIDOCAINE HCL 2 % EX GEL
CUTANEOUS | Status: AC
  Filled 2012-12-11: qty 10

## 2012-12-11 SURGICAL SUPPLY — 38 items
BAG URINE DRAINAGE (UROLOGICAL SUPPLIES) ×1 IMPLANT
BAG URO CATCHER STRL LF (DRAPE) ×2 IMPLANT
BLADE SURG ROTATE 9660 (MISCELLANEOUS) IMPLANT
CANISTER SUCT LVC 12 LTR MEDI- (MISCELLANEOUS) IMPLANT
CATH BONANNO SUPRAPUBIC 14G (CATHETERS) IMPLANT
CATH FOLEY 2WAY SLVR  5CC 16FR (CATHETERS)
CATH FOLEY 2WAY SLVR  5CC 18FR (CATHETERS)
CATH FOLEY 2WAY SLVR 5CC 16FR (CATHETERS) IMPLANT
CATH FOLEY 2WAY SLVR 5CC 18FR (CATHETERS) IMPLANT
CATH SILICONE 5CC 18FR (INSTRUMENTS) ×1 IMPLANT
CATH X-FORCE N30 NEPHROSTOMY (TUBING) ×1 IMPLANT
CLOTH BEACON ORANGE TIMEOUT ST (SAFETY) ×2 IMPLANT
DRAPE CAMERA CLOSED 9X96 (DRAPES) ×2 IMPLANT
ELECT REM PT RETURN 9FT ADLT (ELECTROSURGICAL)
ELECTRODE REM PT RTRN 9FT ADLT (ELECTROSURGICAL) IMPLANT
GLOVE BIO SURGEON STRL SZ8 (GLOVE) ×1 IMPLANT
GLOVE BIOGEL M STRL SZ7.5 (GLOVE) ×4 IMPLANT
GOWN PREVENTION PLUS LG XLONG (DISPOSABLE) ×2 IMPLANT
GOWN STRL REIN XL XLG (GOWN DISPOSABLE) ×2 IMPLANT
GUIDEWIRE STR DUAL SENSOR (WIRE) ×1 IMPLANT
HOLDER FOLEY CATH W/STRAP (MISCELLANEOUS) IMPLANT
IV NS IRRIG 3000ML ARTHROMATIC (IV SOLUTION) IMPLANT
KIT SUPRAPUBIC CATH (MISCELLANEOUS) IMPLANT
MANIFOLD NEPTUNE II (INSTRUMENTS) ×2 IMPLANT
NDL HYPO 25X1 1.5 SAFETY (NEEDLE) ×1 IMPLANT
NDL TROCAR 18X15 ECHO (NEEDLE) IMPLANT
NEEDLE HYPO 25X1 1.5 SAFETY (NEEDLE) IMPLANT
NEEDLE TROCAR 18X15 ECHO (NEEDLE) ×2 IMPLANT
PACK CYSTO (CUSTOM PROCEDURE TRAY) ×2 IMPLANT
PENCIL BUTTON HOLSTER BLD 10FT (ELECTRODE) IMPLANT
SUT SILK 0 (SUTURE) ×2
SUT SILK 0 30XBRD TIE 6 (SUTURE) ×1 IMPLANT
SUT SILK 2 0 30  PSL (SUTURE) ×1
SUT SILK 2 0 30 PSL (SUTURE) IMPLANT
SUT SILK 2 0 FS (SUTURE) IMPLANT
SYRINGE IRR TOOMEY STRL 70CC (SYRINGE) IMPLANT
TUBING CONNECTING 10 (TUBING) ×2 IMPLANT
WATER STERILE IRR 3000ML UROMA (IV SOLUTION) ×1 IMPLANT

## 2012-12-11 NOTE — Anesthesia Postprocedure Evaluation (Signed)
Anesthesia Post Note  Patient: [name]  Procedure(s) Performed: Procedure(s) (LRB): INSERTION OF SUPRAPUBIC CATHETER " NEEDS PERC BALLOON LIKE FOR PERC KIDNEY" (N/A) CYSTOSCOPY (N/A)  Anesthesia type: General  Patient location: PACU  Post pain: Pain level controlled  Post assessment: Post-op Vital signs reviewed  Last Vitals: BP 148/74  Pulse 64  Temp(Src) 36.3 C (Oral)  Resp 16  Ht 5\' 11"  (1.803 m)  Wt 204 lb (92.534 kg)  BMI 28.46 kg/m2  SpO2 96%  Post vital signs: Reviewed  Level of consciousness: sedated  Complications: No apparent anesthesia complications

## 2012-12-11 NOTE — Op Note (Signed)
[name], [name] NO.:  [id number]  MEDICAL RECORD NO.:  [id number]  LOCATION:  [facility]                         FACILITY:  [facility]  PHYSICIAN:  [name], MD     DATE OF BIRTH:  Nov 16, 1922  DATE OF PROCEDURE:  12/11/2012 DATE OF DISCHARGE:  12/11/2012                              OPERATIVE REPORT   PREOPERATIVE DIAGNOSIS:  Massive prostatic hypertrophy with recurrent urinary retention and recurrent urinary tract infections.  PROCEDURE:  Cystoscopy with placement of a suprapubic tube, percutaneous technique.  DRAINS:  An [id number] silicone suprapubic tube straight drain.  FINDINGS: 1. Massive bilobar prostatic hypertrophy. 2. Trabeculated bladder.  ESTIMATED BLOOD LOSS:  Nil.  COMPLICATIONS:  None.  SPECIMENS:  None.  INDICATION:  [name] is a very pleasant 79 year old gentleman with history of multiple medical problems including significant cardiac disease and he is on chronic anticoagulation.  He also has a massive prostatic hypertrophy with a prostate that was larger than 400 g with subsequent recurrent hematuria, urinary tract infections, and urinary retention.  He underwent initial trial of clean intermittent catheterization, however, he was unable to tolerate this.  Options were discussed for further management including observation versus SP tube versus simple prostatectomy.  Given the patient's significant cardiac comorbidity, it was felt that a trial of suprapubic tube would be the safest way to proceed, and he presents for this today.  He received perioperative cardiac clearance and he has been off his strongest blood thinners for several days.  Informed consent was obtained and placed in the medical record.  PROCEDURE IN DETAIL:  The patient being [name], procedure being cysto and suprapubic tube was confirmed, procedure was carried out. Time-out was performed.  Intravenous antibiotics were administered. General LMA  anesthesia was introduced.  The patient was placed into a low lithotomy position.  Sterile field was created by prepping and draping the patient's penis, perineum, and proximal thighs using iodine x3.  Also, his infraumbilical abdomen was prepped and shaved.  Next, a 16-French flexible cystoscope was used to perform cystourethroscopy. This revealed unremarkable injury of urethra, inspection of the posterior urethra revealed a massive bilobar prostatic hypertrophy. Inspection of her bladder revealed severe trabeculation, several small diverticula.  Ureteral orifices were unable to be visualized due to the area of aberrant anatomy.  Given the massive size of the prostate, decision was made to proceed with a percutaneous technique such that the tract would be directly visualized unlike the Lowsley technique.  A suitable position was identified approximately 3 fingerbreadths superior to the pubic bone.  This was cross-referenced with CT scan and found to be away from presumed bowel.  A single incision was made at this site through which an 18-gauge Chiba needle was advanced to the level of the anterior urinary bladder and position that avoided any folding or insertion in the dome or insertion of the prostate.  This was visualized cystoscopically.  Next, a Sensor wire was advanced through this as was the balloon dilation apparatus which was inflated to a pressure of 18 atmospheres, held for 90 seconds and the sheath was advanced over this to the level of urinary bladder and  this was visualized cystoscopically.  An  [id number] silicone catheter was then placed at the urinary bladder.  A 10 mL sterile water placed in the balloon.  The sheath was removed.  Final cystoscopic inspection revealed excellent placement within the urinary bladder without any folding or back walling and was irrigated quantitatively.  This was sutured in place to the level of the skin using interrupted silk x2.  Procedure  was then terminated.  The patient tolerated the procedure well.  There were no immediate periprocedural complications.  The patient was taken to postanesthesia care in stable condition.          ______________________________ [name], MD     TM/MEDQ  D:  12/11/2012  T:  12/11/2012  Job:  [id number]

## 2012-12-11 NOTE — Progress Notes (Signed)
Catheter connected to leg bag and instructions given

## 2012-12-11 NOTE — Anesthesia Preprocedure Evaluation (Addendum)
Anesthesia Evaluation  Patient identified by MRN, date of birth, ID band Patient awake    Reviewed: Allergy & Precautions, H&P , NPO status , Patient's Chart, lab work & pertinent test results  Airway Mallampati: II TM Distance: >3 FB Neck ROM: Full    Dental  (+) Dental Advisory Given   Pulmonary asthma ,  breath sounds clear to auscultation        Cardiovascular hypertension, Pt. on medications and Pt. on home beta blockers +CHF negative cardio ROS  + dysrhythmias Atrial Fibrillation + pacemaker Rhythm:Regular Rate:Normal  Pacer for SSS. Atrial sensed 94% and 6% paced, and Ventricular paced 100%   Neuro/Psych negative neurological ROS  negative psych ROS   GI/Hepatic negative GI ROS, Neg liver ROS, GERD-  ,  Endo/Other  diabetes, Type 2Hypothyroidism   Renal/GU negative Renal ROS     Musculoskeletal negative musculoskeletal ROS (+)   Abdominal   Peds  Hematology negative hematology ROS (+)   Anesthesia Other Findings   Reproductive/Obstetrics                        Anesthesia Physical Anesthesia Plan  ASA: III  Anesthesia Plan: General   Post-op Pain Management:    Induction: Intravenous  Airway Management Planned: LMA  Additional Equipment:   Intra-op Plan:   Post-operative Plan: Extubation in OR  Informed Consent: I have reviewed the patients History and Physical, chart, labs and discussed the procedure including the risks, benefits and alternatives for the proposed anesthesia with the patient or authorized representative who has indicated his/her understanding and acceptance.   Dental advisory given  Plan Discussed with: CRNA  Anesthesia Plan Comments:         Anesthesia Quick Evaluation

## 2012-12-11 NOTE — Brief Op Note (Signed)
12/11/2012  9:06 AM  PATIENT:  [name]  79 y.o. male  PRE-OPERATIVE DIAGNOSIS:  MASSIVE PROSTATE, RECURRENT URINARY TRACT INFECTION  POST-OPERATIVE DIAGNOSIS:  recurrent urinary tract infection, large prostate  PROCEDURE:  Procedure(s): INSERTION OF SUPRAPUBIC CATHETER " NEEDS PERC BALLOON LIKE FOR PERC KIDNEY" (N/A) CYSTOSCOPY (N/A)  SURGEON:  Surgeon(s) and Role:    * [name], MD - Primary  PHYSICIAN ASSISTANT:   ASSISTANTS: none   ANESTHESIA:   general  EBL:     BLOOD ADMINISTERED:none  DRAINS: [id number] silicone SPT to straight drain   LOCAL MEDICATIONS USED:  NONE  SPECIMEN:  No Specimen  DISPOSITION OF SPECIMEN:  N/A  COUNTS:  YES  TOURNIQUET:  * No tourniquets in log *  DICTATION: .Other Dictation: Dictation Number [id number]  PLAN OF CARE: Discharge to home after PACU  PATIENT DISPOSITION:  PACU - hemodynamically stable.   Delay start of Pharmacological VTE agent (>24hrs) due to surgical blood loss or risk of bleeding: not applicable

## 2012-12-11 NOTE — Transfer of Care (Signed)
Immediate Anesthesia Transfer of Care Note  Patient: [name]  Procedure(s) Performed: Procedure(s): INSERTION OF SUPRAPUBIC CATHETER " NEEDS PERC BALLOON LIKE FOR PERC KIDNEY" (N/A) CYSTOSCOPY (N/A)  Patient Location: PACU  Anesthesia Type:General  Level of Consciousness: awake, sedated and responds to stimulation  Airway & Oxygen Therapy: Patient Spontanous Breathing and Patient connected to face mask oxygen  Post-op Assessment: Report given to PACU RN and Post -op Vital signs reviewed and stable  Post vital signs: Reviewed and stable  Complications: No apparent anesthesia complications

## 2012-12-11 NOTE — H&P (Signed)
TEX HEIMER is an 79 y.o. male.    Chief Complaint: Pre-OP Cysto / SP Tube Placement  HPI:   1 - Lower Urinary Tract Symptoms With Massive Prostatic Hypertrophy And Urinary Retention - Long h/o mild-moderate obstructive symptoms with occasional retention from 400gm prostate on finasteride. Faled alpha blockers due to hypotension.  Urodynamics Jun 2014  with low amplitude instability - no leak; BOO - high pressure, low flow with PDet 68 at [facility] 68ml/sec. Prior prostate biopsies negative for malignancy.  It has been previously estimated that given his cardiac comorbidity, there would be at least 10-15% risk peri-operatine mortality with major surgery.  2 - Recurrent UTI - Pt wtih many recurrent GU infections including cystitis, epidiymorchitis, some multidrug resistant requirign outpatient PICC / IV ABX. CT 10/2012 wtihout hydro or stones. PVR >200cc 10/2012.  PMH sig for CHF, Arrythmia / coumadin / pacemaker, DJD (tramadol daily), Bilateral inguinal hernia repair.   Today Joselito is seen to proceed with cysto and SP tube placement. He did not tolerate self-cath.   Past Medical History  Diagnosis Date  . Hypertension   . Pacemaker-Medtronic 2005    generator change  . GERD (gastroesophageal reflux disease)   . Atrial fibrillation      on coumadin    . Atrioventricular block, complete   . Gout   . BPH (benign prostatic hyperplasia)   . Hypothyroid   . Sinusitis   . Osteoporosis   . Hyponatremia 2014    HCTZ  . Lumbar disc disease   . Obesity (BMI 30-39.9)   . Chronic combined systolic and diastolic CHF (congestive heart failure)     EF 40% by ECHO  8/14   . Asthma     as a child  . Diabetes mellitus without complication     patient denies 12/04/12  . Orchitis of right testicle   . Anticoagulant long-term use     Past Surgical History  Procedure Laterality Date  . Pacemaker insertion    . Ablation  1990  . Inguinal hernia repair  1969  . Transurethral resection of  prostate      Family History  Problem Relation Age of Onset  . Heart disease Mother   . Rheum arthritis Mother   . Deep vein thrombosis Sister    Social History:  reports that he quit smoking about 71 years ago. His smoking use included Cigarettes. He has a 2 pack-year smoking history. He has never used smokeless tobacco. He reports that he does not drink alcohol or use illicit drugs.  Allergies:  Allergies  Allergen Reactions  . Hctz [Hydrochlorothiazide]     Hyponatremia     No prescriptions prior to admission    No results found for this or any previous visit (from the past 48 hour(s)). No results found.  Review of Systems  Constitutional: Negative.  Negative for fever and chills.  HENT: Negative.   Eyes: Negative.   Respiratory: Negative.   Cardiovascular: Negative.   Gastrointestinal: Negative.   Genitourinary: Positive for hematuria.  Musculoskeletal: Negative.   Skin: Negative.   Neurological: Negative.   Endo/Heme/Allergies: Negative.   Psychiatric/Behavioral: Negative.     There were no vitals taken for this visit. Physical Exam  Constitutional: He is oriented to person, place, and time. He appears well-developed and well-nourished.  HENT:  Head: Normocephalic and atraumatic.  Eyes: EOM are normal. Pupils are equal, round, and reactive to light.  Neck: Normal range of motion. Neck supple.  Cardiovascular: Normal rate  and regular rhythm.   Respiratory: Effort normal and breath sounds normal.  GI: Soft. Bowel sounds are normal.  Genitourinary: Penis normal.  Musculoskeletal: Normal range of motion.  Neurological: He is alert and oriented to person, place, and time.  Skin: Skin is warm and dry.  Psychiatric: He has a normal mood and affect. His behavior is normal. Judgment and thought content normal.     Assessment/Plan   1 - Lower Urinary Tract Symptoms With Massive Prostatic Hypertrophy - Very difficult situation in man with massive prostate and  significant comorbidity and very high operative risk. I discussed with pt today that the prostate is not the primary issue, but rather the inability to empty the bladder effectively. I outlined several options in order of increasing risk to achieve this goal:  I - self cath BID  II - place suprapubic tube III - robotic simple prostatectomy  He failed self-cath and wants to proceed with trial of SP Tube today as planned. I have also discussed with his cardiologist who agrees this is acceptable risk.  We discussed role of SPT as the simplest form of urinary diversion as well as need for local care with catheter changes approximately [facility] and the outpatient nature of the procedure. We discussed long-term risks including carcinoma (catheters >10years), tube encrustation / stones, periodic tube dislodgment, leaking around tube (incomplete continence). We then discussed the procedure in more detail including risks such as bleeding, infection, damage to bowel, as rare risks including DVT, PE, MI, CVA and mortality. The patient voiced understanding and wants to proceed.  2 - Recurrent UTI - LIkey related to poor bladder emptying as per above. SPT as per above. Most recent UA w/o infectious parameters.   Mackayla Mullins 12/11/2012, 6:29 AM

## 2012-12-12 ENCOUNTER — Encounter (HOSPITAL_COMMUNITY): Payer: Self-pay | Admitting: Urology

## 2013-01-07 ENCOUNTER — Other Ambulatory Visit: Payer: Self-pay | Admitting: Urology

## 2013-01-31 ENCOUNTER — Encounter (HOSPITAL_COMMUNITY): Payer: Self-pay | Admitting: Pharmacy Technician

## 2013-02-04 ENCOUNTER — Encounter (HOSPITAL_COMMUNITY): Payer: Self-pay

## 2013-02-04 ENCOUNTER — Encounter (HOSPITAL_COMMUNITY)
Admission: RE | Admit: 2013-02-04 | Discharge: 2013-02-04 | Disposition: A | Discharge status: Home / Self Care | Payer: Medicare Other | Source: Ambulatory Visit | Attending: Urology | Admitting: Urology

## 2013-02-04 DIAGNOSIS — Z01818 Encounter for other preprocedural examination: Secondary | ICD-10-CM | POA: Insufficient documentation

## 2013-02-04 DIAGNOSIS — Z01812 Encounter for preprocedural laboratory examination: Secondary | ICD-10-CM | POA: Insufficient documentation

## 2013-02-04 HISTORY — DX: Encounter for attention to cystostomy: Z43.5

## 2013-02-04 LAB — CBC
HCT: 37.1 % — ABNORMAL LOW (ref 39.0–52.0)
Hemoglobin: 13 g/dL (ref 13.0–17.0)
Platelets: 218 10*3/uL (ref 150–400)
RBC: 4.23 MIL/uL (ref 4.22–5.81)
RDW: 14 % (ref 11.5–15.5)
WBC: 8.6 10*3/uL (ref 4.0–10.5)

## 2013-02-04 LAB — BASIC METABOLIC PANEL
CO2: 26 mEq/L (ref 19–32)
Calcium: 9.5 mg/dL (ref 8.4–10.5)
Chloride: 96 mEq/L (ref 96–112)
GFR calc Af Amer: 59 mL/min — ABNORMAL LOW (ref >90)
Glucose, Bld: 127 mg/dL — ABNORMAL HIGH (ref 70–99)
Potassium: 4.4 mEq/L (ref 3.5–5.1)
Sodium: 130 mEq/L — ABNORMAL LOW (ref 135–145)

## 2013-02-04 LAB — APTT: aPTT: 42 seconds — ABNORMAL HIGH (ref 24–37)

## 2013-02-04 LAB — PROTIME-INR
INR: 1.98 — ABNORMAL HIGH (ref 0.00–1.49)
Prothrombin Time: 21.9 seconds — ABNORMAL HIGH (ref 11.6–15.2)

## 2013-02-04 NOTE — Patient Instructions (Addendum)
[name]  02/04/2013                           YOUR PROCEDURE IS SCHEDULED ON:  02/10/13               PLEASE REPORT TO SHORT STAY CENTER AT :  9:00 AM               CALL THIS NUMBER IF ANY PROBLEMS THE DAY OF SURGERY :               [phone]                      REMEMBER:   Do not eat food or drink liquids AFTER MIDNIGHT   Take these medicines the morning of surgery with A SIP OF WATER:  symbicort / finesteride / levothyroxine /tramadol if needed   Do not wear jewelry, make-up   Do not wear lotions, powders, or perfumes.   Do not shave legs or underarms 12 hrs. before surgery (men may shave face)  Do not bring valuables to the hospital.  Contacts, dentures or bridgework may not be worn into surgery.  Leave suitcase in the car. After surgery it may be brought to your room.  For patients admitted to the hospital more than one night, checkout time is 11:00                          The day of discharge.   Patients discharged the day of surgery will not be allowed to drive home                             If going home same day of surgery, must have someone stay with you first                           24 hrs at home and arrange for some one to drive you home from hospital.    Special Instructions:   Please read over the following fact sheets that you were given:               1.                       2. [place]                                                X_____________________________________________________________________        Failure to follow these instructions may result in cancellation of your surgery

## 2013-02-04 NOTE — Progress Notes (Signed)
Abnormal BMET faxed to Dr. Manny 

## 2013-02-09 MED ORDER — GENTAMICIN SULFATE 40 MG/ML IJ SOLN
410.0000 mg | INTRAVENOUS | Status: AC
  Administered 2013-02-10: 410 mg via INTRAVENOUS
  Filled 2013-02-09 (×2): qty 10.25

## 2013-02-10 ENCOUNTER — Inpatient Hospital Stay (HOSPITAL_COMMUNITY): Payer: Medicare Other | Admitting: Anesthesiology

## 2013-02-10 ENCOUNTER — Encounter (HOSPITAL_COMMUNITY): Payer: Self-pay | Admitting: *Deleted

## 2013-02-10 ENCOUNTER — Inpatient Hospital Stay (HOSPITAL_COMMUNITY)
Admission: RE | Admit: 2013-02-10 | Discharge: 2013-02-14 | DRG: 707 | Disposition: A | Discharge status: Home / Self Care | Payer: Medicare Other | Source: Ambulatory Visit | Attending: Urology | Admitting: Urology

## 2013-02-10 ENCOUNTER — Encounter (HOSPITAL_COMMUNITY): Payer: Medicare Other | Admitting: Anesthesiology

## 2013-02-10 ENCOUNTER — Encounter (HOSPITAL_COMMUNITY)
Admission: RE | Disposition: A | Discharge status: Home / Self Care | Payer: Self-pay | Source: Ambulatory Visit | Attending: Urology

## 2013-02-10 DIAGNOSIS — Z6828 Body mass index (BMI) 28.0-28.9, adult: Secondary | ICD-10-CM

## 2013-02-10 DIAGNOSIS — Z950 Presence of cardiac pacemaker: Secondary | ICD-10-CM

## 2013-02-10 DIAGNOSIS — Z01812 Encounter for preprocedural laboratory examination: Secondary | ICD-10-CM

## 2013-02-10 DIAGNOSIS — E669 Obesity, unspecified: Secondary | ICD-10-CM | POA: Diagnosis present

## 2013-02-10 DIAGNOSIS — M810 Age-related osteoporosis without current pathological fracture: Secondary | ICD-10-CM | POA: Diagnosis present

## 2013-02-10 DIAGNOSIS — N138 Other obstructive and reflux uropathy: Principal | ICD-10-CM | POA: Diagnosis present

## 2013-02-10 DIAGNOSIS — K219 Gastro-esophageal reflux disease without esophagitis: Secondary | ICD-10-CM | POA: Diagnosis present

## 2013-02-10 DIAGNOSIS — M1990 Unspecified osteoarthritis, unspecified site: Secondary | ICD-10-CM | POA: Diagnosis present

## 2013-02-10 DIAGNOSIS — E039 Hypothyroidism, unspecified: Secondary | ICD-10-CM | POA: Diagnosis present

## 2013-02-10 DIAGNOSIS — I10 Essential (primary) hypertension: Secondary | ICD-10-CM | POA: Diagnosis present

## 2013-02-10 DIAGNOSIS — Z87891 Personal history of nicotine dependence: Secondary | ICD-10-CM

## 2013-02-10 DIAGNOSIS — N401 Enlarged prostate with lower urinary tract symptoms: Principal | ICD-10-CM | POA: Diagnosis present

## 2013-02-10 DIAGNOSIS — J45909 Unspecified asthma, uncomplicated: Secondary | ICD-10-CM | POA: Diagnosis present

## 2013-02-10 DIAGNOSIS — M109 Gout, unspecified: Secondary | ICD-10-CM | POA: Diagnosis present

## 2013-02-10 DIAGNOSIS — Z888 Allergy status to other drugs, medicaments and biological substances status: Secondary | ICD-10-CM

## 2013-02-10 DIAGNOSIS — Z87440 Personal history of urinary (tract) infections: Secondary | ICD-10-CM

## 2013-02-10 DIAGNOSIS — R338 Other retention of urine: Secondary | ICD-10-CM | POA: Diagnosis present

## 2013-02-10 DIAGNOSIS — I5042 Chronic combined systolic (congestive) and diastolic (congestive) heart failure: Secondary | ICD-10-CM | POA: Diagnosis present

## 2013-02-10 DIAGNOSIS — Z7901 Long term (current) use of anticoagulants: Secondary | ICD-10-CM

## 2013-02-10 DIAGNOSIS — I509 Heart failure, unspecified: Secondary | ICD-10-CM | POA: Diagnosis present

## 2013-02-10 DIAGNOSIS — K560 Paralytic ileus: Secondary | ICD-10-CM | POA: Diagnosis not present

## 2013-02-10 DIAGNOSIS — I4891 Unspecified atrial fibrillation: Secondary | ICD-10-CM | POA: Diagnosis present

## 2013-02-10 HISTORY — PX: ROBOT ASSISTED LAPAROSCOPIC RADICAL PROSTATECTOMY: SHX5141

## 2013-02-10 LAB — HEMOGLOBIN AND HEMATOCRIT, BLOOD: Hemoglobin: 11.6 g/dL — ABNORMAL LOW (ref 13.0–17.0)

## 2013-02-10 LAB — BASIC METABOLIC PANEL
BUN: 21 mg/dL (ref 6–23)
CO2: 23 mEq/L (ref 19–32)
Chloride: 99 mEq/L (ref 96–112)
GFR calc Af Amer: 48 mL/min — ABNORMAL LOW (ref >90)
GFR calc non Af Amer: 41 mL/min — ABNORMAL LOW (ref >90)
Potassium: 4 mEq/L (ref 3.5–5.1)

## 2013-02-10 LAB — TYPE AND SCREEN
ABO/RH(D): O POS
Antibody Screen: NEGATIVE

## 2013-02-10 LAB — PROTIME-INR: Prothrombin Time: 15 seconds (ref 11.6–15.2)

## 2013-02-10 SURGERY — ROBOTIC ASSISTED LAPAROSCOPIC RADICAL PROSTATECTOMY
Anesthesia: General | Site: Pelvis

## 2013-02-10 MED ORDER — ESMOLOL HCL 10 MG/ML IV SOLN
INTRAVENOUS | Status: DC | PRN
  Administered 2013-02-10: 10 mg via INTRAVENOUS

## 2013-02-10 MED ORDER — PHENYLEPHRINE HCL 10 MG/ML IJ SOLN
INTRAMUSCULAR | Status: DC | PRN
  Administered 2013-02-10 (×3): 40 ug via INTRAVENOUS
  Administered 2013-02-10: 20 ug via INTRAVENOUS

## 2013-02-10 MED ORDER — FENTANYL CITRATE 0.05 MG/ML IJ SOLN
INTRAMUSCULAR | Status: AC
  Filled 2013-02-10: qty 2

## 2013-02-10 MED ORDER — NEOSTIGMINE METHYLSULFATE 1 MG/ML IJ SOLN
INTRAMUSCULAR | Status: DC | PRN
  Administered 2013-02-10: 4 mg via INTRAVENOUS

## 2013-02-10 MED ORDER — PANTOPRAZOLE SODIUM 40 MG PO TBEC
40.0000 mg | DELAYED_RELEASE_TABLET | Freq: Every day | ORAL | Status: DC
  Administered 2013-02-10 – 2013-02-13 (×4): 40 mg via ORAL
  Filled 2013-02-10 (×5): qty 1

## 2013-02-10 MED ORDER — HYDROMORPHONE HCL PF 2 MG/ML IJ SOLN
INTRAMUSCULAR | Status: AC
  Filled 2013-02-10: qty 1

## 2013-02-10 MED ORDER — ONDANSETRON HCL 4 MG/2ML IJ SOLN
4.0000 mg | INTRAMUSCULAR | Status: DC | PRN
  Administered 2013-02-12 – 2013-02-13 (×6): 4 mg via INTRAVENOUS
  Filled 2013-02-10 (×6): qty 2

## 2013-02-10 MED ORDER — ESMOLOL HCL 10 MG/ML IV SOLN
INTRAVENOUS | Status: AC
  Filled 2013-02-10: qty 10

## 2013-02-10 MED ORDER — SODIUM CHLORIDE 0.9 % IR SOLN
Status: DC | PRN
  Administered 2013-02-10: 1000 mL

## 2013-02-10 MED ORDER — PHENYLEPHRINE 40 MCG/ML (10ML) SYRINGE FOR IV PUSH (FOR BLOOD PRESSURE SUPPORT)
PREFILLED_SYRINGE | INTRAVENOUS | Status: AC
  Filled 2013-02-10: qty 20

## 2013-02-10 MED ORDER — CEFAZOLIN SODIUM-DEXTROSE 2-3 GM-% IV SOLR
INTRAVENOUS | Status: AC
  Filled 2013-02-10: qty 50

## 2013-02-10 MED ORDER — KCL IN DEXTROSE-NACL 20-5-0.45 MEQ/L-%-% IV SOLN
INTRAVENOUS | Status: AC
  Filled 2013-02-10: qty 1000

## 2013-02-10 MED ORDER — OXYCODONE HCL 5 MG PO TABS
5.0000 mg | ORAL_TABLET | ORAL | Status: DC | PRN
  Administered 2013-02-11: 5 mg via ORAL
  Filled 2013-02-10 (×2): qty 1

## 2013-02-10 MED ORDER — LOSARTAN POTASSIUM 50 MG PO TABS
100.0000 mg | ORAL_TABLET | Freq: Every morning | ORAL | Status: DC
  Administered 2013-02-11 – 2013-02-14 (×3): 100 mg via ORAL
  Filled 2013-02-10 (×4): qty 2

## 2013-02-10 MED ORDER — LIDOCAINE HCL (CARDIAC) 20 MG/ML IV SOLN
INTRAVENOUS | Status: AC
  Filled 2013-02-10: qty 5

## 2013-02-10 MED ORDER — KCL IN DEXTROSE-NACL 20-5-0.45 MEQ/L-%-% IV SOLN
INTRAVENOUS | Status: DC
  Administered 2013-02-10: 1000 mL via INTRAVENOUS
  Filled 2013-02-10 (×3): qty 1000

## 2013-02-10 MED ORDER — CEFAZOLIN SODIUM-DEXTROSE 2-3 GM-% IV SOLR
2.0000 g | INTRAVENOUS | Status: AC
  Administered 2013-02-10 (×2): 2 g via INTRAVENOUS

## 2013-02-10 MED ORDER — CISATRACURIUM BESYLATE (PF) 10 MG/5ML IV SOLN
INTRAVENOUS | Status: DC | PRN
  Administered 2013-02-10: 1 mg via INTRAVENOUS
  Administered 2013-02-10 (×2): 2 mg via INTRAVENOUS
  Administered 2013-02-10: 4 mg via INTRAVENOUS
  Administered 2013-02-10: 3 mg via INTRAVENOUS
  Administered 2013-02-10: 2 mg via INTRAVENOUS
  Administered 2013-02-10: 6 mg via INTRAVENOUS
  Administered 2013-02-10: 5 mg via INTRAVENOUS

## 2013-02-10 MED ORDER — PHENYLEPHRINE HCL 10 MG/ML IJ SOLN
INTRAMUSCULAR | Status: AC
  Filled 2013-02-10: qty 1

## 2013-02-10 MED ORDER — FENTANYL CITRATE 0.05 MG/ML IJ SOLN
INTRAMUSCULAR | Status: AC
  Filled 2013-02-10: qty 5

## 2013-02-10 MED ORDER — LEVOTHYROXINE SODIUM 75 MCG PO TABS
75.0000 ug | ORAL_TABLET | Freq: Every day | ORAL | Status: DC
  Administered 2013-02-11 – 2013-02-14 (×3): 75 ug via ORAL
  Filled 2013-02-10 (×5): qty 1

## 2013-02-10 MED ORDER — KETAMINE HCL 50 MG/ML IJ SOLN
INTRAMUSCULAR | Status: AC
  Filled 2013-02-10: qty 10

## 2013-02-10 MED ORDER — LACTATED RINGERS IV SOLN
INTRAVENOUS | Status: DC | PRN
  Administered 2013-02-10 (×2): via INTRAVENOUS

## 2013-02-10 MED ORDER — BUPIVACAINE LIPOSOME 1.3 % IJ SUSP
20.0000 mL | Freq: Once | INTRAMUSCULAR | Status: DC
  Filled 2013-02-10: qty 20

## 2013-02-10 MED ORDER — NEOSTIGMINE METHYLSULFATE 1 MG/ML IJ SOLN
INTRAMUSCULAR | Status: AC
  Filled 2013-02-10: qty 10

## 2013-02-10 MED ORDER — SODIUM CHLORIDE 0.9 % IJ SOLN
INTRAMUSCULAR | Status: AC
  Filled 2013-02-10: qty 10

## 2013-02-10 MED ORDER — KETAMINE HCL 10 MG/ML IJ SOLN
INTRAMUSCULAR | Status: DC | PRN
  Administered 2013-02-10 (×2): 25 mg via INTRAVENOUS

## 2013-02-10 MED ORDER — SODIUM CHLORIDE 0.9 % IJ SOLN
INTRAMUSCULAR | Status: AC
  Filled 2013-02-10: qty 50

## 2013-02-10 MED ORDER — SENNA 8.6 MG PO TABS
1.0000 | ORAL_TABLET | Freq: Two times a day (BID) | ORAL | Status: DC
  Administered 2013-02-10 – 2013-02-14 (×7): 8.6 mg via ORAL
  Filled 2013-02-10 (×7): qty 1

## 2013-02-10 MED ORDER — PROPOFOL 10 MG/ML IV BOLUS
INTRAVENOUS | Status: AC
  Filled 2013-02-10: qty 20

## 2013-02-10 MED ORDER — GLYCOPYRROLATE 0.2 MG/ML IJ SOLN
INTRAMUSCULAR | Status: DC | PRN
  Administered 2013-02-10: .7 mg via INTRAVENOUS

## 2013-02-10 MED ORDER — LACTATED RINGERS IV SOLN
INTRAVENOUS | Status: DC
  Administered 2013-02-10: 1000 mL via INTRAVENOUS

## 2013-02-10 MED ORDER — PROPOFOL 10 MG/ML IV BOLUS
INTRAVENOUS | Status: DC | PRN
  Administered 2013-02-10: 200 mg via INTRAVENOUS

## 2013-02-10 MED ORDER — SUCCINYLCHOLINE CHLORIDE 20 MG/ML IJ SOLN
INTRAMUSCULAR | Status: AC
  Filled 2013-02-10: qty 2

## 2013-02-10 MED ORDER — BUDESONIDE-FORMOTEROL FUMARATE 160-4.5 MCG/ACT IN AERO
2.0000 | INHALATION_SPRAY | Freq: Two times a day (BID) | RESPIRATORY_TRACT | Status: DC
  Administered 2013-02-10 – 2013-02-14 (×7): 2 via RESPIRATORY_TRACT
  Filled 2013-02-10: qty 6

## 2013-02-10 MED ORDER — EPHEDRINE SULFATE 50 MG/ML IJ SOLN
INTRAMUSCULAR | Status: DC | PRN
  Administered 2013-02-10: 10 mg via INTRAVENOUS

## 2013-02-10 MED ORDER — GLYCOPYRROLATE 0.2 MG/ML IJ SOLN
INTRAMUSCULAR | Status: AC
  Filled 2013-02-10: qty 3

## 2013-02-10 MED ORDER — FENTANYL CITRATE 0.05 MG/ML IJ SOLN
INTRAMUSCULAR | Status: DC | PRN
  Administered 2013-02-10 (×4): 50 ug via INTRAVENOUS
  Administered 2013-02-10: 25 ug via INTRAVENOUS
  Administered 2013-02-10 (×2): 50 ug via INTRAVENOUS
  Administered 2013-02-10 (×2): 25 ug via INTRAVENOUS
  Administered 2013-02-10: 100 ug via INTRAVENOUS
  Administered 2013-02-10: 50 ug via INTRAVENOUS
  Administered 2013-02-10: 25 ug via INTRAVENOUS
  Administered 2013-02-10: 50 ug via INTRAVENOUS

## 2013-02-10 MED ORDER — ONDANSETRON HCL 4 MG/2ML IJ SOLN
INTRAMUSCULAR | Status: DC | PRN
  Administered 2013-02-10 (×2): 2 mg via INTRAVENOUS

## 2013-02-10 MED ORDER — FENTANYL CITRATE 0.05 MG/ML IJ SOLN
25.0000 ug | INTRAMUSCULAR | Status: DC | PRN
  Administered 2013-02-10: 50 ug via INTRAVENOUS
  Administered 2013-02-10 (×3): 25 ug via INTRAVENOUS

## 2013-02-10 MED ORDER — SODIUM CHLORIDE 0.9 % IV BOLUS (SEPSIS)
1000.0000 mL | Freq: Once | INTRAVENOUS | Status: AC
  Administered 2013-02-10: 1000 mL via INTRAVENOUS

## 2013-02-10 MED ORDER — PHENYLEPHRINE HCL 10 MG/ML IJ SOLN
10.0000 mg | INTRAVENOUS | Status: DC | PRN
  Administered 2013-02-10: 20 ug/min via INTRAVENOUS

## 2013-02-10 MED ORDER — DOCUSATE SODIUM 100 MG PO CAPS
100.0000 mg | ORAL_CAPSULE | Freq: Two times a day (BID) | ORAL | Status: DC
  Administered 2013-02-10 – 2013-02-14 (×7): 100 mg via ORAL
  Filled 2013-02-10 (×9): qty 1

## 2013-02-10 MED ORDER — FINASTERIDE 5 MG PO TABS
5.0000 mg | ORAL_TABLET | Freq: Every day | ORAL | Status: DC
  Administered 2013-02-10 – 2013-02-14 (×4): 5 mg via ORAL
  Filled 2013-02-10 (×5): qty 1

## 2013-02-10 MED ORDER — SODIUM CHLORIDE 0.9 % IJ SOLN
INTRAMUSCULAR | Status: DC | PRN
  Administered 2013-02-10: 18:00:00

## 2013-02-10 MED ORDER — LACTATED RINGERS IR SOLN
Status: DC | PRN
  Administered 2013-02-10: 1000 mL

## 2013-02-10 MED ORDER — ACETAMINOPHEN 500 MG PO TABS
1000.0000 mg | ORAL_TABLET | Freq: Four times a day (QID) | ORAL | Status: AC
  Administered 2013-02-10 – 2013-02-11 (×4): 1000 mg via ORAL
  Filled 2013-02-10 (×5): qty 2

## 2013-02-10 MED ORDER — HYDROMORPHONE HCL PF 1 MG/ML IJ SOLN
INTRAMUSCULAR | Status: DC | PRN
Start: 2013-02-10 — End: 2013-02-10
  Administered 2013-02-10: 0.5 mg via INTRAVENOUS
  Administered 2013-02-10 (×2): 1 mg via INTRAVENOUS
  Administered 2013-02-10: 0.5 mg via INTRAVENOUS

## 2013-02-10 MED ORDER — SUCCINYLCHOLINE CHLORIDE 20 MG/ML IJ SOLN
INTRAMUSCULAR | Status: DC | PRN
  Administered 2013-02-10 (×2): 100 mg via INTRAVENOUS

## 2013-02-10 MED ORDER — EPHEDRINE SULFATE 50 MG/ML IJ SOLN
INTRAMUSCULAR | Status: AC
  Filled 2013-02-10: qty 1

## 2013-02-10 MED ORDER — HYDROMORPHONE HCL PF 1 MG/ML IJ SOLN
0.5000 mg | INTRAMUSCULAR | Status: DC | PRN
  Administered 2013-02-11 – 2013-02-12 (×2): 1 mg via INTRAVENOUS
  Filled 2013-02-10 (×3): qty 1

## 2013-02-10 SURGICAL SUPPLY — 72 items
ADH SKN CLS APL DERMABOND .7 (GAUZE/BANDAGES/DRESSINGS) ×1
APL ESCP 34 STRL LF DISP (HEMOSTASIS) ×1
APPLICATOR SURGIFLO ENDO (HEMOSTASIS) ×1 IMPLANT
CABLE HIGH FREQUENCY MONO STRZ (ELECTRODE) ×2 IMPLANT
CANISTER SUCTION 2500CC (MISCELLANEOUS) ×1 IMPLANT
CATH AINSWORTH 30CC 24FR (CATHETERS) ×1 IMPLANT
CATH COUDE 22FR 5CC (CATHETERS) IMPLANT
CATH FOLEY 2WAY SLVR 18FR 30CC (CATHETERS) ×2 IMPLANT
CATH RIBBED COUDE 30CC (CATHETERS) ×2
CATH TIEMANN FOLEY 18FR 5CC (CATHETERS) ×2 IMPLANT
CHLORAPREP W/TINT 26ML (MISCELLANEOUS) ×2 IMPLANT
CLIP LIGATING HEM O LOK PURPLE (MISCELLANEOUS) ×2 IMPLANT
CLIP LIGATING HEMO LOK XL GOLD (MISCELLANEOUS) ×1 IMPLANT
CONT SPECI 4OZ STER CLIK (MISCELLANEOUS) ×1 IMPLANT
CORDS BIPOLAR (ELECTRODE) ×1 IMPLANT
COVER SURGICAL LIGHT HANDLE (MISCELLANEOUS) ×2 IMPLANT
COVER TIP SHEARS 8 DVNC (MISCELLANEOUS) ×1 IMPLANT
COVER TIP SHEARS 8MM DA VINCI (MISCELLANEOUS) ×1
CUTTER ECHEON FLEX ENDO 45 340 (ENDOMECHANICALS) ×1 IMPLANT
DECANTER SPIKE VIAL GLASS SM (MISCELLANEOUS) ×2 IMPLANT
DERMABOND ADVANCED (GAUZE/BANDAGES/DRESSINGS) ×1
DERMABOND ADVANCED .7 DNX12 (GAUZE/BANDAGES/DRESSINGS) ×1 IMPLANT
DRAPE SURG IRRIG POUCH 19X23 (DRAPES) ×2 IMPLANT
DRSG TEGADERM 2-3/8X2-3/4 SM (GAUZE/BANDAGES/DRESSINGS) ×5 IMPLANT
DRSG TEGADERM 4X4.75 (GAUZE/BANDAGES/DRESSINGS) ×3 IMPLANT
DRSG TEGADERM 6X8 (GAUZE/BANDAGES/DRESSINGS) ×4 IMPLANT
ELECT REM PT RETURN 9FT ADLT (ELECTROSURGICAL) ×2
ELECTRODE REM PT RTRN 9FT ADLT (ELECTROSURGICAL) ×1 IMPLANT
GAUZE SPONGE 2X2 8PLY STRL LF (GAUZE/BANDAGES/DRESSINGS) ×1 IMPLANT
GLOVE BIO SURGEON STRL SZ 6.5 (GLOVE) ×2 IMPLANT
GLOVE BIOGEL M STRL SZ7.5 (GLOVE) ×9 IMPLANT
GLOVE SURG SS PI 8.0 STRL IVOR (GLOVE) ×1 IMPLANT
GOWN PREVENTION PLUS LG XLONG (DISPOSABLE) ×2 IMPLANT
GOWN STRL REIN XL XLG (GOWN DISPOSABLE) ×7 IMPLANT
GUIDEWIRE ANG ZIPWIRE 035X150 (WIRE) ×1 IMPLANT
HOLDER FOLEY CATH W/STRAP (MISCELLANEOUS) ×2 IMPLANT
IV LACTATED RINGERS 1000ML (IV SOLUTION) ×2 IMPLANT
IV NS 1000ML (IV SOLUTION) ×2
IV NS 1000ML BAXH (IV SOLUTION) IMPLANT
KIT ACCESSORY DA VINCI DISP (KITS) ×1
KIT ACCESSORY DVNC DISP (KITS) ×1 IMPLANT
KIT PROCEDURE DA VINCI SI (MISCELLANEOUS) ×1
KIT PROCEDURE DVNC SI (MISCELLANEOUS) ×1 IMPLANT
MANIFOLD NEPTUNE II (INSTRUMENTS) ×1 IMPLANT
NDL INSUFFLATION 14GA 120MM (NEEDLE) ×1 IMPLANT
NEEDLE INSUFFLATION 14GA 120MM (NEEDLE) ×2 IMPLANT
NEEDLE SPNL 22GX7 SPINOC (NEEDLE) ×1 IMPLANT
PACK ROBOT UROLOGY CUSTOM (CUSTOM PROCEDURE TRAY) ×2 IMPLANT
RELOAD GREEN ECHELON 45 (STAPLE) ×1 IMPLANT
SEALER ENDOWRIST ONE VESSEL (MISCELLANEOUS) ×1 IMPLANT
SET TUBE IRRIG SUCTION NO TIP (IRRIGATION / IRRIGATOR) ×2 IMPLANT
SOLUTION ELECTROLUBE (MISCELLANEOUS) ×2 IMPLANT
SPONGE GAUZE 2X2 STER 10/PKG (GAUZE/BANDAGES/DRESSINGS) ×1
SPONGE LAP 4X18 X RAY DECT (DISPOSABLE) ×2 IMPLANT
SUT ETHILON 3 0 PS 1 (SUTURE) ×2 IMPLANT
SUT MNCRL AB 4-0 PS2 18 (SUTURE) ×4 IMPLANT
SUT PDS AB 1 CT1 27 (SUTURE) ×4 IMPLANT
SUT V-LOC BARB 180 2/0GR6 GS22 (SUTURE) ×6
SUT VIC AB 0 CT1 27 (SUTURE) ×2
SUT VIC AB 0 CT1 27XBRD ANTBC (SUTURE) IMPLANT
SUT VIC AB 0 UR5 27 (SUTURE) ×1 IMPLANT
SUT VIC AB 2-0 SH 27 (SUTURE) ×24
SUT VIC AB 2-0 SH 27X BRD (SUTURE) IMPLANT
SUT VICRYL 0 UR6 27IN ABS (SUTURE) ×1 IMPLANT
SUT VLOC BARB 180 ABS3/0GR12 (SUTURE) ×4
SUTURE V-LC BRB 180 2/0GR6GS22 (SUTURE) IMPLANT
SUTURE VLOC BRB 180 ABS3/0GR12 (SUTURE) IMPLANT
SYR 27GX1/2 1ML LL SAFETY (SYRINGE) ×2 IMPLANT
TOWEL OR NON WOVEN STRL DISP B (DISPOSABLE) ×2 IMPLANT
TROCAR 12M 150ML BLUNT (TROCAR) ×2 IMPLANT
TUBE FEEDING 5FR 36IN KANGAROO (TUBING) ×2 IMPLANT
WATER STERILE IRR 1500ML POUR (IV SOLUTION) ×4 IMPLANT

## 2013-02-10 NOTE — Anesthesia Postprocedure Evaluation (Signed)
  Anesthesia Post-op Note  Patient: [name]  Procedure(s) Performed: Procedure(s) (LRB): ROBOTIC ASSISTED LAPAROSCOPIC SIMPLE PROSTATECTOMY (N/A)  Patient Location: PACU  Anesthesia Type: General  Level of Consciousness: awake and alert   Airway and Oxygen Therapy: Patient Spontanous Breathing  Post-op Pain: mild  Post-op Assessment: Post-op Vital signs reviewed, Patient's Cardiovascular Status Stable, Respiratory Function Stable, Patent Airway and No signs of Nausea or vomiting  Last Vitals:  Filed Vitals:   02/10/13 1915  BP: 172/74  Pulse: 109  Temp:   Resp: 20    Post-op Vital Signs: stable   Complications: No apparent anesthesia complications

## 2013-02-10 NOTE — H&P (Signed)
[name] is an 79 y.o. male.    Chief Complaint: Pre-op Robotic Simple Prostatectomy of Massive Prostate  HPI:       1 - Lower Urinary Tract Symptoms With Massive Prostatic Hypertrophy And Urinary Retention - Long h/o mild-moderate obstructive symptoms with occasional retention from 400gm prostate on finasteride. Failed alpha blockers due to hypotension. Failed trial of self-cath. Miserable with SP Tube as he has quite a bit of leakage with spasms and does not like the tube itself.  Urodynamics Jun 2014  with low amplitude instability - no leak; BOO - high pressure, low flow with PDet 68 at [facility] 11ml/sec. Prior prostate biopsies negative for malignancy.  Based on this he underwent cysto / SP Tube 12/11/2012 to see if SPT could meet his goals as much less major surgery than simple prostatectomy.  2 - Recurrent UTI - Pt wtih many recurrent GU infections including cystitis, epidiymorchitis, some multidrug resistant requirign outpatient PICC / IV ABX. CT 10/2012 wtihout hydro or stones. PVR >200cc 10/2012.  PMH sig for CHF, Arrythmia / coumadin / pacemaker, DJD (tramadol daily), Bilateral inguinal hernia repair.   Today Rhyley is seen to proceed with robotic simple prostatectomy to help address his impressive outlet obstruction. He has bene couseled extensively about risks / benefits by myself, his cardiologist [name], as well as Dr. Duaine Dredge and has very good understanding. He has bene off his coumadin. Most recent UA without infectious parameters.   Past Medical History  Diagnosis Date  . Hypertension   . Pacemaker-Medtronic 2005    generator change  . GERD (gastroesophageal reflux disease)   . Atrial fibrillation      on coumadin    . Atrioventricular block, complete   . Gout   . BPH (benign prostatic hyperplasia)   . Hypothyroid   . Sinusitis   . Osteoporosis   . Hyponatremia 2014    HCTZ  . Lumbar disc disease   . Obesity (BMI 30-39.9)   . Orchitis of right testicle    . Anticoagulant long-term use   . Chronic combined systolic and diastolic CHF (congestive heart failure)     EF 40% by ECHO  8/14  - followed by [name]  . Asthma     until age 73   . Encounter for care or replacement of suprapubic tube     Past Surgical History  Procedure Laterality Date  . Pacemaker insertion    . Ablation  1990  . Inguinal hernia repair  1969  . Transurethral resection of prostate    . Insertion of suprapubic catheter N/A 12/11/2012    Procedure: INSERTION OF SUPRAPUBIC CATHETER " NEEDS PERC BALLOON LIKE FOR PERC KIDNEY";  Surgeon: [name], MD;  Location: [facility];  Service: Urology;  Laterality: N/A;  . Cystoscopy N/A 12/11/2012    Procedure: CYSTOSCOPY;  Surgeon: [name], MD;  Location: [facility];  Service: Urology;  Laterality: N/A;  . Suprpubic tube      Family History  Problem Relation Age of Onset  . Heart disease Mother   . Rheum arthritis Mother   . Deep vein thrombosis Sister    Social History:  reports that he quit smoking about 71 years ago. His smoking use included Cigarettes. He has a 2 pack-year smoking history. He has never used smokeless tobacco. He reports that he does not drink alcohol or use illicit drugs.  Allergies:  Allergies  Allergen Reactions  . Hctz [Hydrochlorothiazide]     Hyponatremia  No prescriptions prior to admission    No results found for this or any previous visit (from the past 48 hour(s)). No results found.  Review of Systems  Constitutional: Negative.  Negative for fever and chills.  HENT: Negative.   Eyes: Negative.   Respiratory: Negative.   Cardiovascular: Negative.   Gastrointestinal: Negative.   Genitourinary: Negative.   Musculoskeletal: Negative.   Skin: Negative.   Neurological: Negative.   Endo/Heme/Allergies: Negative.   Psychiatric/Behavioral: Negative.     There were no vitals taken for this visit. Physical Exam  Constitutional: He is oriented to person, place, and time.  He appears well-developed and well-nourished.  HENT:  Head: Normocephalic and atraumatic.  Eyes: EOM are normal. Pupils are equal, round, and reactive to light.  Neck: Neck supple.  Cardiovascular: Normal rate and regular rhythm.   Respiratory: Effort normal and breath sounds normal.  GI: Soft. Bowel sounds are normal.  Genitourinary: Penis normal.  SPT c/d/i with clear urine.   Musculoskeletal: Normal range of motion.  Neurological: He is alert and oriented to person, place, and time.  Skin: Skin is warm and dry.  Psychiatric: He has a normal mood and affect. His behavior is normal. Judgment and thought content normal.     Assessment/Plan  1 - Lower Urinary Tract Symptoms With Massive Prostatic Hypertrophy - We rediscussed simple prostatectomy and specifically robotic simple prostatectomy being the technique that I most commonly perform. I showed the patient on their abdomen the approximately 6 small incision (trocar) sites as well as presumed extraction sites with robotic approach as well as possible open incision sites should open conversion be necessary. We rediscussed peri-operative risks including bleeding, infection, deep vein thrombosis, pulmonary embolism, compartment syndrome, nuropathy / neuropraxia, bladder neck contracture, heart attack, stroke, death, as well as long-term risks such as non-cure / need for additional therapy. We specifically addressed that the procedure is the most definitive in terms of treatment for refractory BPH but that future regrowth is possible and that this operation does NOT obviate the need for future prostate cancer screening.  We rediscussed the typical hospital course including usual 1-2 night hospitalization, discharge with foley catheter in place usually for 1-2 weeks before voiding trial as well as usually 2 week recovery until able to perform most non-strenuous activity and 6 weeks until able to return to most jobs and more strenuous activity such  as exercise. I adressed that in his case he may require longer catheter duration and possible cystogram possible to removal.   2 - Recurrent UTI - LIkey related to poor bladder emptying as per above. Now with SPT and no interval infections. He will receive peri-op prophylaxis based on most recent CX's.   Javyon Fontan 02/10/2013, 8:16 AM

## 2013-02-10 NOTE — Transfer of Care (Signed)
Immediate Anesthesia Transfer of Care Note  Patient: [name]  Procedure(s) Performed: Procedure(s) (LRB): ROBOTIC ASSISTED LAPAROSCOPIC SIMPLE PROSTATECTOMY (N/A)  Patient Location: PACU  Anesthesia Type: General  Level of Consciousness: sedated, patient cooperative and responds to stimulation  Airway & Oxygen Therapy: Patient Spontanous Breathing and Patient connected to face mask oxgen  Post-op Assessment: Report given to PACU RN and Post -op Vital signs reviewed and stable  Post vital signs: Reviewed and stable  Complications: No apparent anesthesia complications

## 2013-02-10 NOTE — Anesthesia Preprocedure Evaluation (Addendum)
Anesthesia Evaluation  Patient identified by MRN, date of birth, ID band Patient awake    Reviewed: Allergy & Precautions, H&P , NPO status , Patient's Chart, lab work & pertinent test results  Airway Mallampati: II TM Distance: >3 FB Neck ROM: Full    Dental no notable dental hx.    Pulmonary asthma , former smoker,  breath sounds clear to auscultation  Pulmonary exam normal       Cardiovascular Exercise Tolerance: Good hypertension, +CHF negative cardio ROS  + dysrhythmias Atrial Fibrillation + pacemaker Rhythm:Regular Rate:Normal  Pacemaker for complet AV block. Generator change 07-19-11  ECG and CXR reviewed.   Neuro/Psych negative neurological ROS  negative psych ROS   GI/Hepatic Neg liver ROS, GERD-  Medicated,  Endo/Other  Hypothyroidism   Renal/GU negative Renal ROS  negative genitourinary   Musculoskeletal negative musculoskeletal ROS (+)   Abdominal   Peds negative pediatric ROS (+)  Hematology negative hematology ROS (+)   Anesthesia Other Findings   Reproductive/Obstetrics negative OB ROS                          Anesthesia Physical Anesthesia Plan  ASA: III  Anesthesia Plan: General   Post-op Pain Management:    Induction: Intravenous  Airway Management Planned: Oral ETT  Additional Equipment:   Intra-op Plan:   Post-operative Plan: Extubation in OR  Informed Consent: I have reviewed the patients History and Physical, chart, labs and discussed the procedure including the risks, benefits and alternatives for the proposed anesthesia with the patient or authorized representative who has indicated his/her understanding and acceptance.   Dental advisory given  Plan Discussed with: CRNA  Anesthesia Plan Comments:         Anesthesia Quick Evaluation

## 2013-02-10 NOTE — Brief Op Note (Signed)
02/10/2013  6:25 PM  PATIENT:  [name]  79 y.o. male  PRE-OPERATIVE DIAGNOSIS:  MASSIVE PROSTATE, URINARY RETENTION  POST-OPERATIVE DIAGNOSIS:  massive prostate, urinary retention  PROCEDURE:  Procedure(s): ROBOTIC ASSISTED LAPAROSCOPIC SIMPLE PROSTATECTOMY (N/A)  SURGEON:  Surgeon(s) and Role:    * Fredricka Bonine, MD - Assisting    * Irine Seal, MD - Assisting    * [name], MD - Primary  PHYSICIAN ASSISTANT:   ASSISTANTS: Irene Shipper MD   ANESTHESIA:   local and general  EBL:  Total I/O In: 1000 [I.V.:1000] Out: 41 [Other:20; Blood:275]  BLOOD ADMINISTERED:none  DRAINS: 1 =- 79F aisworth foley, 2 - JP to bulb suction   LOCAL MEDICATIONS USED:  MARCAINE     SPECIMEN:  Source of Specimen:  1 - median lobe of prostate, 2- Rt lobe of prostate  DISPOSITION OF SPECIMEN:  PATHOLOGY  COUNTS:  YES  TOURNIQUET:  * No tourniquets in log *  DICTATION: .Other Dictation: Dictation Number [id number]  PLAN OF CARE: Admit to inpatient   PATIENT DISPOSITION:  PACU - hemodynamically stable.   Delay start of Pharmacological VTE agent (>24hrs) due to surgical blood loss or risk of bleeding: yes

## 2013-02-11 ENCOUNTER — Encounter (HOSPITAL_COMMUNITY): Payer: Self-pay | Admitting: Urology

## 2013-02-11 LAB — BASIC METABOLIC PANEL
CO2: 27 mEq/L (ref 19–32)
Calcium: 8.4 mg/dL (ref 8.4–10.5)
Creatinine, Ser: 1.52 mg/dL — ABNORMAL HIGH (ref 0.50–1.35)
GFR calc Af Amer: 45 mL/min — ABNORMAL LOW (ref >90)
GFR calc non Af Amer: 39 mL/min — ABNORMAL LOW (ref >90)

## 2013-02-11 LAB — HEMOGLOBIN AND HEMATOCRIT, BLOOD
HCT: 34.2 % — ABNORMAL LOW (ref 39.0–52.0)
Hemoglobin: 11.6 g/dL — ABNORMAL LOW (ref 13.0–17.0)

## 2013-02-11 MED ORDER — MENTHOL 3 MG MT LOZG
1.0000 | LOZENGE | OROMUCOSAL | Status: DC | PRN
  Administered 2013-02-11: 3 mg via ORAL
  Filled 2013-02-11: qty 9

## 2013-02-11 NOTE — Progress Notes (Signed)
1 Day Post-Op  Subjective:  1 - Lower Urinary Tract Symptoms With Massive Prostatic Hypertrophy And Urinary Retention -  S/p robotic simple prostatectomy 02/10/2013.  Path pending. Observed in stepdown POD 0.  Today [name] is doing well. JP output low, urine clearing. He has been out of bed and ambulatory x 2 today. Hgb today stable.   Objective: Vital signs in last 24 hours: Temp:  [97.4 F (36.3 C)-98.9 F (37.2 C)] 98.4 F (36.9 C) (12/16 1600) Pulse Rate:  [67-120] 98 (12/16 1600) Resp:  [8-20] 14 (12/16 1600) BP: (113-183)/(45-86) 151/71 mmHg (12/16 1600) SpO2:  [94 %-100 %] 96 % (12/16 1600) Weight:  [93.7 kg (206 lb 9.1 oz)] 93.7 kg (206 lb 9.1 oz) (12/16 0400)    Intake/Output from previous day: 12/15 0701 - 12/16 0700 In: 3558.3 [I.V.:3418.3] Out: 1320 [Urine:930; Drains:70; Blood:300] Intake/Output this shift: Total I/O In: 1833.3 [P.O.:840; I.V.:783.3; Other:210] Out: 50 [Urine:700; Drains:30]  General appearance: alert, cooperative and appears stated age Head: Normocephalic, without obvious abnormality, atraumatic Eyes: conjunctivae/corneas clear. PERRL, EOM's intact. Fundi benign. Ears: normal TM's and external ear canals both ears Nose: Nares normal. Septum midline. Mucosa normal. No drainage or sinus tenderness. Throat: lips, mucosa, and tongue normal; teeth and gums normal Neck: no adenopathy, no carotid bruit, no JVD, supple, symmetrical, trachea midline and thyroid not enlarged, symmetric, no tenderness/mass/nodules Back: symmetric, no curvature. ROM normal. No CVA tenderness. Resp: clear to auscultation bilaterally Chest wall: no tenderness Cardio: regular rate and rhythm, S1, S2 normal, no murmur, click, rub or gallop GI: soft, non-tender; bowel sounds normal; no masses,  no organomegaly Male genitalia: normal, Foley c/d/i with dark pink urine, no clots.  Pelvic: external genitalia normal Extremities: extremities normal, atraumatic, no cyanosis or  edema Pulses: 2+ and symmetric Skin: Skin color, texture, turgor normal. No rashes or lesions Lymph nodes: Cervical, supraclavicular, and axillary nodes normal. Neurologic: Grossly normal Incision/Wound: Recent port sites and extraction sites c/d/i. JP with scant serous output.  Lab Results:   Recent Labs  02/10/13 1910 02/11/13 0354  HGB 11.6* 11.6*  HCT 34.3* 34.2*   BMET  Recent Labs  02/10/13 1910 02/11/13 0354  NA 134* 134*  K 4.0 4.8  CL 99 100  CO2 23 27  GLUCOSE 182* 200*  BUN 21 22  CREATININE 1.45* 1.52*  CALCIUM 8.4 8.4   PT/INR  Recent Labs  02/10/13 0956  LABPROT 15.0  INR 1.21   ABG No results found for this basename: PHART, PCO2, PO2, HCO3,  in the last 72 hours  Studies/Results: No results found.  Anti-infectives: Anti-infectives   Start     Dose/Rate Route Frequency Ordered Stop   02/10/13 0918  ceFAZolin (ANCEF) IVPB 2 g/50 mL premix     2 g 100 mL/hr over 30 Minutes Intravenous 30 min pre-op 02/10/13 [id number] 02/10/13 1733   02/09/13 2034  gentamicin (GARAMYCIN) 410 mg in dextrose 5 % 100 mL IVPB     410 mg 110.3 mL/hr over 60 Minutes Intravenous 30 min pre-op 02/09/13 2034 02/10/13 1200      Assessment/Plan: 1 - Lower Urinary Tract Symptoms With Massive Prostatic Hypertrophy And Urinary Retention - Doing well POD 1. Transfer to floor when bed available. Discussed goals for discharge. Given current progress likely DC [facility] /Fri.  [facility], Moriah Loughry 02/11/2013

## 2013-02-11 NOTE — Op Note (Signed)
NAMEJAIDON, SIKES NO.:  [id number]  MEDICAL RECORD NO.:  [id number]  LOCATION:  [id number]                         FACILITY:  [facility]  PHYSICIAN:  [name], MD     DATE OF BIRTH:  10-12-1922  DATE OF PROCEDURE: 02/10/2013 DATE OF DISCHARGE:                              OPERATIVE REPORT   DIAGNOSIS:  Massive prostatic hypertrophy with urinary retention.  PROCEDURE:  Robotic-assisted laparoscopic simple prostatectomy.  ASSISTANTS: 1. Festus Aloe, MD, Primary Assist. 2. Irine Seal, MD.  ESTIMATED BLOOD LOSS:  300 mL.  COMPLICATIONS:  None.  SPECIMENS: 1. Right lobe of the prostate. 2. Median lobe of the prostate.  FINDINGS: 1. Massive trilobar prostatic hypertrophy. 2. Direct visualization and noninjury of ureteral orifices following     resection of median lobe and right lobe.  DRAINS: 1. A 24-French Ainsworth catheter to straight drain. 2. Jackson-Pratt drain to bulb suction.  INDICATION:  [name] is a pleasant 79 year old gentleman with history of massive prostatic hypertrophy with gland at least 400 g.  He is having problems with refractory urinary retention and has failed maximal medical therapy.  He underwent urodynamics, which revealed preserved bladder function.  Options were discussed including self catheterization versus suprapubic tube versus simple prostatectomy.  He could not tolerate self cath.  He was miserable with suprapubic tube and adamantly wished to proceed with simple prostatectomy.  Options were discussed including robotic versus open simple prostatectomy.  On consultation with his cardiologist, it was felt that robotic approach would be the safest as it had lower potential for blood loss.  Informed consent was obtained and placed in medical record.  PROCEDURE IN DETAIL:  The patient being, [name], was verified. Procedure being robotic prostatectomy was confirmed.  Procedure was carried out.  Time-out was  performed.  Intravenous antibiotics were administered.  General endotracheal anesthesia was introduced.  The patient was placed into a low lithotomy position.  His arms were tucked. Compression devices were applied.  Lateral prominence of the knees were padded with foam padding.  He was further fashioned on the operative table using 3-inch tape over foam.  He was placed into a steep Trendelenburg position and was found to be adequately positioned. Sterile field was created by prepping and draping the patient's penis, perineum, and proximal thighs using iodine x3 and his infra-xiphoid abdomen using chlorhexidine gluconate.  His previous suprapubic tube was removed and a Foley catheter was placed per urethra to straight drain. His SP tube was removed prior to prepping notably.  Next, a high-flow low-pressure pneumoperitoneum was obtained using Veress technique in the supraumbilical midline having passed the aspiration and drop test.  A 12- mm camera port was placed in the same location.  Laparoscopic examination of the peritoneal cavity revealed no significant adhesions and no visceral injury.  Additional ports were placed as follows; right paramedian 8-mm robotic port, left paramedian 8-mm robotic port, right far lateral 12-mm assistant port, left far lateral 8-mm robotic port, and right paramedian 5-mm suction port.  Robot was docked and passed through electronic checks.  All attention then directed developing the space of Retzius.  Incision was made lateral to the medial umbilical ligament first on the  left side from the midline towards the area of the internal ring.  Mirror image was performed on the left side.  The anterior attachments were then taken down, the prior SP Tube tract was identified and marked to be included within the cystotomy. The space of Retzius was further developed towards the area of the pubic bone.  Linear cystotomy was then made anteriorly from the area of prior  suprapubic tube towards position of approximately 3 cm proximal to the anterior base of the prostate.  Stay sutures were applied, holding the bladder open at the 2 o'clock, 12 o'clock, and 10 o'clock positions.  This revealed a massive prostatic hypertrophy.  A stay suture was applied to the median lobe which was held anteriorly, which then allowed visualization of the ureteral orifices, and a 6-French feeding tube was placed into each one for identification during the procedure.  Attention was then directed at resection of the median lobe; _the median lobe on gentle superior traction, first using monopolar scissor, incision was made in the mucosa all the away from ureteral orifices and dissection proceeded into the adenoma plane of the median lobe.  Next, the bipolar vessels sealer apparatus was used to help provide coagulation current and the median lobe of the prostate was completely resected from the base towards the apex, was set aside in a separate EndoCatch bag within the abdominal wall cavity. Next, mucosa was incised, lateral lobe circumferentially with monopolar scissors.  Attention was directed at the resection of the right lobe of the prostate.  Again, using the vessel sealer apparatus and entering the adenoma plane of the prostate, very careful dissection was performed in combination of blunt dissection and dissection with vessel sealer circumferentially mobilizing the anterior, lateral, posterior, and medial aspects at the base towards the apex of the right lobe of the prostate.  This lobe was massive in orientation as expected.  Final apical attachments could not be visualized using this technique, therefore the proximal two- thirds was excised, set aside in a separate EndoCatch bag.  This allowed for much better visualization of the apical aspect of the right  lobe, which was then dissected towards the apex and completely excised and set aside to be added with a right lobe  of the prostate.  Excision of the right prostatic fossa revealed excellent hemostasis following additional point coagulation current.  After resection of the right lateral lobe, there was a very wide opening, conical-appearing prostatic urethra from the bladder base towards the area of the bladder neck and prostatic apex. Decision was made not to resect the left lateral lobe of the prostate as it was felt that this would actually worsen the bladder emptying as it would create a cavity larger than the current capacity of the bladder that was adynamic and below the trigone.  The left lobe was therefore left in situ.  At this point, the posterior mucosa at 6 o'clock was brought forward towards the area of the posterior urethra using figure- of-eight Vicryl.  The mucosal edges were reapproximated bringing the bladder mucosa towards the well-visualized and circumferentially intact prostatic fossa and mucosa using running 0 Vicryl.  The ureteral orifices were in excellent anatomic position following these maneuvers and were grossly uninjured and placeholder feeding tubes were removed. A 24-French Ainsworth catheter was brought per urethra into the operative field.  The 10 mL of water placed in the balloon.  This was guided robotically in proper position.  Cystotomy was then closed using first layer of running 2-0  V-Loc followed by second imbricating layer of 2 V-Loc and a third imbricating layer of running 2-0 Vicryl.  This irrigated quantitatively per urethra with no significant leak and there was no evidence of blood clots formed in the urinary bladder, also confirming excellent hemostasis.  The previous right lateral most assistant port was closed over the fascia using a Carter-Thomason suture passer.  Closed suction drain was brought through the previous left lateral most robotic port into the peritoneal cavity and the pelvis. Robot was undocked.  Specimen was retrieved by extending the  previous robotic camera port site for total distance approximately 4 cm and removing the specimens in their entirety and set aside, labeled right lateral prostate and median lobe of prostate respectively.  Extraction site was closed using figure-of-eight PDS x3 followed by Scarpa's reapproximation using a running Vicryl.  All skin incisions were closed at the level of the skin using subcuticular Monocryl followed by Dermabond.  Drain stitch was applied.  Procedure was then terminated.  The patient tolerated the procedure well.  There were no immediate periprocedural complications.  The patient was taken to postanesthesia care in stable condition.          ______________________________ [name], MD     TM/MEDQ  D:  02/10/2013  T:  02/11/2013  Job:  [id number]

## 2013-02-12 MED ORDER — BISACODYL 10 MG RE SUPP
10.0000 mg | Freq: Once | RECTAL | Status: AC
  Administered 2013-02-12: 10 mg via RECTAL
  Filled 2013-02-12: qty 1

## 2013-02-12 NOTE — Progress Notes (Signed)
2 Days Post-Op  Subjective:  1 - Lower Urinary Tract Symptoms With Massive Prostatic Hypertrophy And Urinary Retention -  S/p robotic simple prostatectomy 02/10/2013.  Path benign. Observed in stepdown POD 0.  Today Jenny continues to do well. JP output low, urine clearing. Still no BM but tolerating PO well and pain controlled.  Objective: Vital signs in last 24 hours: Temp:  [97.8 F (36.6 C)-98.8 F (37.1 C)] 98.1 F (36.7 C) (12/17 1200) Pulse Rate:  [58-106] 97 (12/17 1200) Resp:  [14-17] 14 (12/16 1600) BP: (118-175)/(47-82) 147/67 mmHg (12/17 1200) SpO2:  [94 %-100 %] 95 % (12/17 1200)    Intake/Output from previous day: 12/16 0701 - 12/17 0700 In: 1873.3 [P.O.:840; I.V.:783.3] Out: 2130 [Urine:2100; Drains:30] Intake/Output this shift: Total I/O In: 10 [P.O.:480; Other:50] Out: 200 [Urine:200]  General appearance: alert, cooperative and appears stated age Head: Normocephalic, without obvious abnormality, atraumatic Eyes: conjunctivae/corneas clear. PERRL, EOM's intact. Fundi benign. Ears: normal TM's and external ear canals both ears Nose: Nares normal. Septum midline. Mucosa normal. No drainage or sinus tenderness. Throat: lips, mucosa, and tongue normal; teeth and gums normal Neck: no adenopathy, no carotid bruit, no JVD, supple, symmetrical, trachea midline and thyroid not enlarged, symmetric, no tenderness/mass/nodules Back: symmetric, no curvature. ROM normal. No CVA tenderness. Resp: clear to auscultation bilaterally Chest wall: no tenderness Cardio: regular rate and rhythm, S1, S2 normal, no murmur, click, rub or gallop GI: soft, non-tender; bowel sounds normal; no masses,  no organomegaly Male genitalia: normal, foley c/d/i with clearing urine, still some hematuria no clots Extremities: extremities normal, atraumatic, no cyanosis or edema Pulses: 2+ and symmetric Skin: Skin color, texture, turgor normal. No rashes or lesions Lymph nodes: Cervical,  supraclavicular, and axillary nodes normal. Neurologic: Grossly normal Incision/Wound: REcent incision and port sites c/d/i. JP with scant serous output.  Lab Results:   Recent Labs  02/10/13 1910 02/11/13 0354  HGB 11.6* 11.6*  HCT 34.3* 34.2*   BMET  Recent Labs  02/10/13 1910 02/11/13 0354  NA 134* 134*  K 4.0 4.8  CL 99 100  CO2 23 27  GLUCOSE 182* 200*  BUN 21 22  CREATININE 1.45* 1.52*  CALCIUM 8.4 8.4   PT/INR  Recent Labs  02/10/13 0956  LABPROT 15.0  INR 1.21   ABG No results found for this basename: PHART, PCO2, PO2, HCO3,  in the last 72 hours  Studies/Results: No results found.  Anti-infectives: Anti-infectives   Start     Dose/Rate Route Frequency Ordered Stop   02/10/13 0918  ceFAZolin (ANCEF) IVPB 2 g/50 mL premix     2 g 100 mL/hr over 30 Minutes Intravenous 30 min pre-op 02/10/13 [id number] 02/10/13 1733   02/09/13 2034  gentamicin (GARAMYCIN) 410 mg in dextrose 5 % 100 mL IVPB     410 mg 110.3 mL/hr over 60 Minutes Intravenous 30 min pre-op 02/09/13 2034 02/10/13 1200      Assessment/Plan:  1 - Lower Urinary Tract Symptoms With Massive Prostatic Hypertrophy And Urinary Retention - Transfer to floor when bed available. Ducolax x 1 today. Discussed goals for discharge. Given current progress likely DC Tomorrow.   LOS: 2 days    [facility], Dak Szumski 02/12/2013

## 2013-02-13 ENCOUNTER — Inpatient Hospital Stay (HOSPITAL_COMMUNITY): Payer: Medicare Other

## 2013-02-13 LAB — CBC
Hemoglobin: 12.1 g/dL — ABNORMAL LOW (ref 13.0–17.0)
MCH: 30.6 pg (ref 26.0–34.0)
Platelets: 178 10*3/uL (ref 150–400)
RBC: 3.96 MIL/uL — ABNORMAL LOW (ref 4.22–5.81)
RDW: 13.9 % (ref 11.5–15.5)

## 2013-02-13 LAB — BASIC METABOLIC PANEL
Calcium: 9.2 mg/dL (ref 8.4–10.5)
Creatinine, Ser: 1.2 mg/dL (ref 0.50–1.35)
GFR calc Af Amer: 60 mL/min — ABNORMAL LOW (ref >90)
GFR calc non Af Amer: 52 mL/min — ABNORMAL LOW (ref >90)
Glucose, Bld: 171 mg/dL — ABNORMAL HIGH (ref 70–99)
Potassium: 3.7 mEq/L (ref 3.5–5.1)
Sodium: 128 mEq/L — ABNORMAL LOW (ref 135–145)

## 2013-02-13 LAB — CREATININE, FLUID (PLEURAL, PERITONEAL, JP DRAINAGE)

## 2013-02-13 MED ORDER — BISACODYL 10 MG RE SUPP
10.0000 mg | Freq: Once | RECTAL | Status: AC
  Administered 2013-02-13: 10 mg via RECTAL
  Filled 2013-02-13: qty 1

## 2013-02-13 NOTE — Progress Notes (Signed)
3 Days Post-Op  Subjective:   1 - Lower Urinary Tract Symptoms With Massive Prostatic Hypertrophy And Urinary Retention -  S/p robotic simple prostatectomy 02/10/2013.  Path benign. Observed in stepdown POD 0, transferred to floor POD3. Had some mild abdominal bloating and nausea POD 3 and KUB c/w ileus but quickly resolved when had BM / flatus. JP Cr same as serum and removed POD3.   Today [name] is having less nausea after BM / flatus earleir today. Now on floor from stepdown.   Objective: Vital signs in last 24 hours: Temp:  [97.8 F (36.6 C)-99.2 F (37.3 C)] 97.8 F (36.6 C) (12/18 1517) Pulse Rate:  [80-111] 111 (12/18 1517) Resp:  [18] 18 (12/18 1517) BP: (165-184)/(80-103) 177/99 mmHg (12/18 1517) SpO2:  [96 %-98 %] 97 % (12/18 1517)    Intake/Output from previous day: 12/17 0701 - 12/18 0700 In: 700 [P.O.:600] Out: 2010 [Urine:1900; Drains:110] Intake/Output this shift: Total I/O In: 200 [P.O.:200] Out: 600 [Urine:600]  General appearance: alert, cooperative and appears stated age Head: Normocephalic, without obvious abnormality, atraumatic Eyes: conjunctivae/corneas clear. PERRL, EOM's intact. Fundi benign. Ears: normal TM's and external ear canals both ears Nose: Nares normal. Septum midline. Mucosa normal. No drainage or sinus tenderness. Throat: lips, mucosa, and tongue normal; teeth and gums normal Neck: no adenopathy, no carotid bruit, no JVD, supple, symmetrical, trachea midline and thyroid not enlarged, symmetric, no tenderness/mass/nodules Back: symmetric, no curvature. ROM normal. No CVA tenderness. Resp: clear to auscultation bilaterally Breasts: normal appearance, no masses or tenderness, no gynecomastia Cardio: regular rate and rhythm, S1, S2 normal, no murmur, click, rub or gallop GI: soft, non-tender; bowel sounds normal; no masses,  no organomegaly and + BS.  Male genitalia: normal Extremities: extremities normal, atraumatic, no cyanosis or  edema Pulses: 2+ and symmetric Skin: Skin color, texture, turgor normal. No rashes or lesions Lymph nodes: Cervical, supraclavicular, and axillary nodes normal. Neurologic: Grossly normal Incision/Wound: JP with scant serous output, removed. All port sites / extraction sites c/d/i. Foley with continued clearing urine.  Lab Results:   Recent Labs  02/11/13 0354 02/13/13 0715  WBC  --  16.2*  HGB 11.6* 12.1*  HCT 34.2* 34.5*  PLT  --  178   BMET  Recent Labs  02/11/13 0354 02/13/13 0715  NA 134* 128*  K 4.8 3.7  CL 100 93*  CO2 27 22  GLUCOSE 200* 171*  BUN 22 16  CREATININE 1.52* 1.20  CALCIUM 8.4 9.2   PT/INR No results found for this basename: LABPROT, INR,  in the last 72 hours ABG No results found for this basename: PHART, PCO2, PO2, HCO3,  in the last 72 hours  Studies/Results: Dg Abd 1 View  02/13/2013   CLINICAL DATA:  Abdominal distention and pain  EXAM: ABDOMEN - 1 VIEW  COMPARISON:  Abdominal CT 10/25/2012  FINDINGS: Diffuse gaseous distension of small and large bowel. The rectum and distal colon is not well opacified, no obstructing lesion seen on comparison CT. No typical features of volvulus. No evidence of pneumatosis.  There is been placement of a large-bore Foley catheter. Mottled lucency overlapping the left lower abdomen and upper thigh is most likely external to the patient. Imaged lung bases are clear.  IMPRESSION: Adynamic ileus.   Electronically Signed   By: Jorje Guild M.D.   On: 02/13/2013 05:30    Anti-infectives: Anti-infectives   Start     Dose/Rate Route Frequency Ordered Stop   02/10/13 0918  ceFAZolin (ANCEF) IVPB  2 g/50 mL premix     2 g 100 mL/hr over 30 Minutes Intravenous 30 min pre-op 02/10/13 [id number] 02/10/13 1733   02/09/13 2034  gentamicin (GARAMYCIN) 410 mg in dextrose 5 % 100 mL IVPB     410 mg 110.3 mL/hr over 60 Minutes Intravenous 30 min pre-op 02/09/13 2034 02/10/13 1200      Assessment/Plan:  1 - Lower Urinary  Tract Symptoms With Massive Prostatic Hypertrophy And Urinary Retention - Had some nausea this AM therefore kept in house. Now resolved with BM / flatus. JP removed. Will plan for hopeful DC in AM as long as has good night.   [facility], [name] 02/13/2013

## 2013-02-13 NOTE — Plan of Care (Signed)
Problem: Phase II Progression Outcomes Goal: Tolerating diet Outcome: Not Progressing Pt complains of nausea .Abdomen is distended but soft.Dr Tresa Moore in and saw pt.He refused breakfast and lunch ,but sips on ginger ale.Has passed gas several times this am.

## 2013-02-14 LAB — URINE CULTURE: Culture: NO GROWTH

## 2013-02-14 MED ORDER — TRAMADOL HCL 50 MG PO TABS: 50.0000 mg | ORAL_TABLET | Freq: Four times a day (QID) | ORAL | Status: AC | PRN

## 2013-02-14 MED ORDER — SULFAMETHOXAZOLE-TMP DS 800-160 MG PO TABS: 1.0000 | ORAL_TABLET | Freq: Two times a day (BID) | ORAL | Status: DC

## 2013-02-14 NOTE — Discharge Summary (Signed)
Physician Discharge Summary  Patient ID: [name] MRN: [id number] DOB/AGE: 09/03/1922 79 y.o.  Admit date: 02/10/2013 Discharge date: 02/14/2013  Admission Diagnoses: Massive Prostatic Hypertrophy And Urinary Retention -  Discharge Diagnoses:  Active Problems:   Benign prostatic hypertrophy with urinary retention   Discharged Condition: good  Hospital Course:   1 - Lower Urinary Tract Symptoms With Massive Prostatic Hypertrophy And Urinary Retention - S/p robotic simple prostatectomy 02/10/2013. Path benign. Observed in stepdown POD 0, transferred to floor POD3. Had some mild abdominal bloating and nausea POD 3 and KUB c/w ileus but quickly resolved when had BM / flatus. JP Cr same as serum and removed POD3. By POD 4, the day of discharge, Pt is ambulatory, tolerating regular diet, pain controlled on PO meds, and felt to be adequate for discharge.  Consults: None  Significant Diagnostic Studies: labs: Path - benign prostate  Treatments: surgery: robotic simple prostatectomy 02/10/2013.  Discharge Exam: Blood pressure 140/75, pulse 100, temperature 98.1 F (36.7 C), temperature source Oral, resp. rate 18, height 5\' 11"  (1.803 m), weight 93.7 kg (206 lb 9.1 oz), SpO2 98.00%. General appearance: alert, cooperative, appears stated age and very vigorous for age, hard of hearing Head: Normocephalic, without obvious abnormality, atraumatic Eyes: conjunctivae/corneas clear. PERRL, EOM's intact. Fundi benign. Ears: normal TM's and external ear canals both ears Nose: Nares normal. Septum midline. Mucosa normal. No drainage or sinus tenderness. Throat: lips, mucosa, and tongue normal; teeth and gums normal Neck: no adenopathy, no carotid bruit, no JVD, supple, symmetrical, trachea midline and thyroid not enlarged, symmetric, no tenderness/mass/nodules Back: symmetric, no curvature. ROM normal. No CVA tenderness. Resp: clear to auscultation bilaterally Chest wall: no  tenderness Cardio: regular rate and rhythm, S1, S2 normal, no murmur, click, rub or gallop GI: soft, non-tender; bowel sounds normal; no masses,  no organomegaly Male genitalia: normal, foley c/d/i with dark yellow / light pink urine, no clots Extremities: extremities normal, atraumatic, no cyanosis or edema Pulses: 2+ and symmetric Skin: Skin color, texture, turgor normal. No rashes or lesions Lymph nodes: Cervical, supraclavicular, and axillary nodes normal. Neurologic: Grossly normal Incision/Wound: Recent port sites, drain sites, extraction sites all c/d/i.  Disposition: 01-Home or Self Care     Medication List    ASK your doctor about these medications       alendronate 70 MG tablet  Commonly known as:  FOSAMAX  Take 70 mg by mouth every 7 (seven) days. Take with a full glass of water on an empty stomach.  Taken on Saturday.     beta carotene w/minerals tablet  Take 1 tablet by mouth at bedtime.     budesonide-formoterol 160-4.5 MCG/ACT inhaler  Commonly known as:  SYMBICORT  Inhale 2 puffs into the lungs 2 (two) times daily.     CALCIUM 600 + D PO  Take 1 tablet by mouth daily.     cholecalciferol 1000 UNITS tablet  Commonly known as:  VITAMIN D  Take 2,000 Units by mouth daily.     docusate sodium 100 MG capsule  Commonly known as:  COLACE  Take 100 mg by mouth 2 (two) times daily as needed for constipation.     finasteride 5 MG tablet  Commonly known as:  PROSCAR  Take 5 mg by mouth daily.     flurazepam 30 MG capsule  Commonly known as:  DALMANE  Take 30 mg by mouth at bedtime as needed for sleep.     levothyroxine 75 MCG tablet  Commonly known  as:  SYNTHROID, LEVOTHROID  Take 75 mcg by mouth daily before breakfast.     LINZESS 145 MCG Caps capsule  Generic drug:  Linaclotide  Take 145 mcg by mouth daily.     losartan 100 MG tablet  Commonly known as:  COZAAR  Take 100 mg by mouth every morning.     mometasone 50 MCG/ACT nasal spray  Commonly  known as:  NASONEX  Place 2 sprays into the nose daily as needed (for congestion).     multivitamin with minerals Tabs tablet  Take 1 tablet by mouth daily.     omega-3 acid ethyl esters 1 G capsule  Commonly known as:  LOVAZA  Take 1 g by mouth every morning.     omeprazole 20 MG capsule  Commonly known as:  PRILOSEC  Take 20 mg by mouth at bedtime.     psyllium 95 % Pack  Commonly known as:  HYDROCIL/METAMUCIL  Take 1 packet by mouth daily.     traMADol 50 MG tablet  Commonly known as:  ULTRAM  Take 50 mg by mouth every 6 (six) hours as needed for pain.     warfarin 3 MG tablet  Commonly known as:  COUMADIN  Take 1.5-3 mg by mouth daily. Takes 1/2 tablet on Wednesday and 1 tablet all other days           Follow-up Information   Follow up with [name], MD. (Office will contact you to confirm visit for X-Rays, MD Visit, and catheter removal)    Specialty:  Urology   Contact information:   [place]. [address], [place] [address] [phone]       Signed: [name] 02/14/2013, 6:50 AM

## 2013-08-14 ENCOUNTER — Ambulatory Visit (INDEPENDENT_AMBULATORY_CARE_PROVIDER_SITE_OTHER): Payer: Medicare Other | Admitting: Neurology

## 2013-08-14 ENCOUNTER — Telehealth: Payer: Self-pay | Admitting: Neurology

## 2013-08-14 ENCOUNTER — Encounter: Payer: Self-pay | Admitting: Neurology

## 2013-08-14 ENCOUNTER — Encounter (INDEPENDENT_AMBULATORY_CARE_PROVIDER_SITE_OTHER): Payer: Self-pay

## 2013-08-14 VITALS — BP 145/81 | HR 103 | Ht 70.0 in | Wt 203.0 lb

## 2013-08-14 DIAGNOSIS — H532 Diplopia: Secondary | ICD-10-CM

## 2013-08-14 DIAGNOSIS — D518 Other vitamin B12 deficiency anemias: Secondary | ICD-10-CM

## 2013-08-14 DIAGNOSIS — H4920 Sixth [abducent] nerve palsy, unspecified eye: Secondary | ICD-10-CM

## 2013-08-14 DIAGNOSIS — R6889 Other general symptoms and signs: Secondary | ICD-10-CM

## 2013-08-14 HISTORY — DX: Sixth (abducent) nerve palsy, unspecified eye: H49.20

## 2013-08-14 HISTORY — DX: Diplopia: H53.2

## 2013-08-14 NOTE — Telephone Encounter (Signed)
Patient calling to state that he would like the CT scan done soon because he will be going out of town starting next week. Patient requesting that someone contact him about getting it done tomorrow. Please return call to patient and advise.

## 2013-08-14 NOTE — Patient Instructions (Signed)
Diplopia  °Double vision (diplopia) means that you are seeing two of everything. Diplopia usually occurs with both eyes open, and may be worse when looking in one particular direction. If both eyes must be open to see double, this is called binocular diplopia. If double images are seen in just one eye, this is called monocular diplopia.  °CAUSES  °Binocular Diplopia °· Disorder affecting the muscles that move the eyes or the nerves that control those muscles. °· Tumor or other mass pushing on an eye from beside or behind the eye. °· Myasthenia gravis. This is a neuromuscular illness that causes the body's muscles to tire easily. The eye muscles and eyelid muscles become weak. The eyes do not track together well. °· Grave's disease. This is an overactivity of the thyroid gland. This condition causes swelling of tissues around the eyes. This produces a bulging out of the eyeball. °· Blowout fracture of the bone around the eye. The muscles of the eye socket are damaged. This often happens when the eye is hit with force. °· Complications after certain surgeries, such as a lens implant after cataract surgery. °· Fluid-filled mass (abscess) behind or beside the eye, infection, or abnormal connection between arteries and veins. °Sometimes, no cause is found. °Monocular Diplopia °· Problems with corrective lens or contacts. °· Corneal abrasion, infection, severe injury, or bulging and irregularity of the corneal surface (keratoconus). °· Irregularities of the pupil from drugs, severe injury, or other causes. °· Problems involving the lens of the eye, such as opacities or cataracts. °· Complications after certain surgeries, such as a lens implant after cataract surgery. °· Retinal detachment or problems involving the blood vessels of the retina. °Sometimes, no cause is found. °SYMPTOMS  °Binocular Diplopia °· When one eye is closed or covered, the double images disappear. °Monocular Diplopia °· When the unaffected eye is  closed or covered, the double images remain. The double images disappear when the affected eye is closed or covered. °DIAGNOSIS  °A diagnosis is made during an eye exam. °TREATMENT  °Treatment depends on the cause or underlying disease.  °· Relief of double vision symptoms may be achieved by patching one eye or by using special glasses. °· Surgery on the muscles of the eye may be needed. °SEEK IMMEDIATE MEDICAL CARE IF:  °· You see two images of a single object you are looking at, either with both eyes open or with just one eye open. °Document Released: 12/16/2003 Document Revised: 05/08/2011 Document Reviewed: 10/02/2007 °ExitCare® Patient Information ©2015 [employer]. This information is not intended to replace advice given to you by your health care provider. Make sure you discuss any questions you have with your health care provider. ° °

## 2013-08-14 NOTE — Progress Notes (Signed)
Reason for visit: Double vision  [name] is a 80 y.o. male  History of present illness:  [name] is an 80 year old right-handed white male with a history of hypertension and atrial fibrillation. He reports that he awakened 2 days ago with double vision. He has noted that he is able to read a newspaper, but at distances, he sees double vision that is horizontal in nature. When he looks to the right, the double vision is more prominent. He has also noted a dull headache in the right frontotemporal area. The patient denies any numbness or weakness of the face, arms, or legs. He denies problems with speech or swallowing, and he denies any significant changes in balance or issues with loss of vision. He indicates that if he covers one eye or the other, the double vision goes away and the vision is clear. The patient went to his ophthalmologist, Dr. Delman Cheadle, and he was told that he had an abducens palsy, and he was sent to this office. The patient comes to this office for further evaluation.  Past Medical History  Diagnosis Date  . Hypertension   . Pacemaker-[employer] 2005    generator change  . GERD (gastroesophageal reflux disease)   . Atrial fibrillation      on coumadin    . Atrioventricular block, complete   . Gout   . BPH (benign prostatic hyperplasia)   . Hypothyroid   . Sinusitis   . Osteoporosis   . Hyponatremia 2014    HCTZ  . Lumbar disc disease   . Obesity (BMI 30-39.9)   . Orchitis of right testicle   . Anticoagulant long-term use   . Chronic combined systolic and diastolic CHF (congestive heart failure)     EF 40% by ECHO  8/14  - followed by [name]  . Asthma     until age 15   . Encounter for care or replacement of suprapubic tube   . 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 Diplopia 08/14/2013  . HOH (hard of hearing)     hearing aids  . Macular degeneration     Past Surgical History  Procedure Laterality Date  . Pacemaker insertion    .  Ablation  1990  . Inguinal hernia repair  1969  . Transurethral resection of prostate    . Insertion of suprapubic catheter N/A 12/11/2012    Procedure: INSERTION OF SUPRAPUBIC CATHETER " NEEDS PERC BALLOON LIKE FOR PERC KIDNEY";  Surgeon: [name], MD;  Location: [facility];  Service: Urology;  Laterality: N/A;  . Cystoscopy N/A 12/11/2012    Procedure: CYSTOSCOPY;  Surgeon: [name], MD;  Location: [facility];  Service: Urology;  Laterality: N/A;  . Suprpubic tube    . Robot assisted laparoscopic radical prostatectomy N/A 02/10/2013    Procedure: ROBOTIC ASSISTED LAPAROSCOPIC SIMPLE PROSTATECTOMY;  Surgeon: [name], MD;  Location: [facility];  Service: Urology;  Laterality: N/A;  . Cataract extraction Bilateral     Family History  Problem Relation Age of Onset  . Heart disease Mother   . Rheum arthritis Mother   . Deep vein thrombosis Sister     Social history:  reports that he quit smoking about 72 years ago. His smoking use included Cigarettes. He has a 2 pack-year smoking history. He has never used smokeless tobacco. He reports that he does not drink alcohol or use illicit drugs.  Medications:  Current Outpatient Prescriptions on File Prior to Visit  Medication Sig Dispense Refill  . alendronate (FOSAMAX) 70 MG tablet Take 70 mg by mouth every 7 (seven) days. Take with a full glass of water on an empty stomach.  Taken on Saturday.      . beta carotene w/minerals (OCUVITE) tablet Take 1 tablet by mouth at bedtime.      . budesonide-formoterol (SYMBICORT) 160-4.5 MCG/ACT inhaler Inhale 2 puffs into the lungs 2 (two) times daily.      . Calcium Carbonate-Vitamin D (CALCIUM 600 + D PO) Take 1 tablet by mouth daily.      . cholecalciferol (VITAMIN D) 1000 UNITS tablet Take 2,000 Units by mouth daily.      [name] docusate sodium (COLACE) 100 MG capsule Take 100 mg by mouth 2 (two) times daily as needed for constipation.      . finasteride (PROSCAR) 5 MG tablet Take 5 mg by mouth  daily.       . flurazepam (DALMANE) 30 MG capsule Take 30 mg by mouth at bedtime as needed for sleep.       [name] levothyroxine (SYNTHROID, LEVOTHROID) 75 MCG tablet Take 75 mcg by mouth daily before breakfast.      . losartan (COZAAR) 100 MG tablet Take 100 mg by mouth every morning.       . mometasone (NASONEX) 50 MCG/ACT nasal spray Place 2 sprays into the nose daily as needed (for congestion).       . Multiple Vitamin (MULTIVITAMIN WITH MINERALS) TABS tablet Take 1 tablet by mouth daily.      [name] omega-3 acid ethyl esters (LOVAZA) 1 G capsule Take 1 g by mouth every morning.      [name] omeprazole (PRILOSEC) 20 MG capsule Take 20 mg by mouth at bedtime.       . traMADol (ULTRAM) 50 MG tablet Take 1 tablet (50 mg total) by mouth every 6 (six) hours as needed for moderate pain. Post-operatively.  30 tablet  0  . warfarin (COUMADIN) 3 MG tablet Take 1.5-3 mg by mouth daily. Takes 1/2 tablet on Wednesday and 1 tablet all other days       No current facility-administered medications on file prior to visit.      Allergies  Allergen Reactions  . Hctz [Hydrochlorothiazide]     Hyponatremia     ROS:  Out of a complete 14 system review of symptoms, the patient complains only of the following symptoms, and all other reviewed systems are negative.  Double vision Headache  Blood pressure 145/81, pulse 103, height 5\' 10"  (1.778 m), weight 203 lb (92.08 kg).  Physical Exam  General: The patient is alert and cooperative at the time of the examination.  Eyes: Pupils are equal, round, and reactive to light. Discs are flat bilaterally.  Neck: The neck is supple, no carotid bruits are noted.  Respiratory: The respiratory examination is clear.  Cardiovascular: The cardiovascular examination reveals a regular rate and rhythm, no obvious murmurs or rubs are noted.  Skin: Extremities are without significant edema.  Neurologic Exam  Mental status: The patient is alert and oriented x 3 at the time of  the examination. The patient has apparent normal recent and remote memory, with an apparently normal attention span and concentration ability.  Cranial nerves: Facial symmetry is present. There is good sensation of the face to pinprick and soft touch bilaterally. The strength of the facial muscles and the muscles to head turning and shoulder shrug are normal bilaterally. Speech is well enunciated, no aphasia or  dysarthria is noted. Extraocular movements are full, with exception that with right conjugate gaze deviation, there is incomplete abduction of the right eye. Visual fields are full. The tongue is midline, and the patient has symmetric elevation of the soft palate. No obvious hearing deficits are noted. The patient is hard of hearing.  Motor: The motor testing reveals 5 over 5 strength of all 4 extremities. Good symmetric motor tone is noted throughout.  Sensory: Sensory testing is intact to pinprick, soft touch, vibration sensation, and position sense on all 4 extremities. No evidence of extinction is noted.  Coordination: Cerebellar testing reveals good finger-nose-finger and heel-to-shin bilaterally.  Gait and station: Gait is normal. Tandem gait is unsteady. Romberg is negative. No drift is seen.  Reflexes: Deep tendon reflexes are symmetric and normal bilaterally. Toes are downgoing bilaterally.   Assessment/Plan:  1. Double vision, probable right abducens palsy  The patient appears to have examination consistent with a sixth nerve palsy on the right. The patient will be set up for further blood work to exclude other entities such as temporal arteritis or myasthenia gravis. The patient will have CT evaluation the brain as he is unable to have MRI secondary to a cardiac pacemaker placement. He will followup in 4 months. The patient may operate a motor vehicle, but he may need to use an occluder for one of the lenses to prevent double vision. The double vision should improve gradually  over the next several months.  Jill Alexanders MD 08/14/2013 2:34 PM  [facility] [address] [place], [place] [address]  Phone [phone] Fax [phone]

## 2013-08-15 ENCOUNTER — Telehealth: Payer: Self-pay | Admitting: Neurology

## 2013-08-15 ENCOUNTER — Ambulatory Visit
Admission: RE | Admit: 2013-08-15 | Discharge: 2013-08-15 | Disposition: A | Discharge status: Home / Self Care | Payer: Medicare Other | Source: Ambulatory Visit | Attending: Neurology | Admitting: Neurology

## 2013-08-15 ENCOUNTER — Other Ambulatory Visit (INDEPENDENT_AMBULATORY_CARE_PROVIDER_SITE_OTHER): Payer: Self-pay

## 2013-08-15 DIAGNOSIS — Z0289 Encounter for other administrative examinations: Secondary | ICD-10-CM

## 2013-08-15 DIAGNOSIS — H532 Diplopia: Secondary | ICD-10-CM

## 2013-08-15 DIAGNOSIS — H4920 Sixth [abducent] nerve palsy, unspecified eye: Secondary | ICD-10-CM

## 2013-08-15 NOTE — Telephone Encounter (Signed)
Patient is scheduled for CT today @ 2 pm. [facility].

## 2013-08-15 NOTE — Telephone Encounter (Signed)
Wants to Dr. Jannifer Franklin to call and have his CT approved and scheduled today because he is leaving to go out of town and wants it done before he goes. I advised him that there is a process to the authorizations for CT. He was adamate that he is going out of town and Dr. Jannifer Franklin will call and get the approval today for him.

## 2013-08-18 ENCOUNTER — Telehealth: Payer: Self-pay | Admitting: Neurology

## 2013-08-18 NOTE — Telephone Encounter (Signed)
  I called the patient. The CT of the brain looks OK. He is unable to have a MRI.  CT head 08/15/2013:  Impression   Unremarkable CT head (without). No acute findings.

## 2013-08-20 LAB — ENA+DNA/DS+SJORGEN'S
DSDNA AB: 47 [IU]/mL — AB (ref 0–9)
ENA RNP Ab: >8 AI — ABNORMAL HIGH (ref 0.0–0.9)
ENA SM Ab Ser-aCnc: <0.2 AI (ref 0.0–0.9)
ENA SSA (RO) Ab: <0.2 AI (ref 0.0–0.9)
ENA SSB (LA) Ab: <0.2 AI (ref 0.0–0.9)

## 2013-08-20 LAB — TSH: TSH: 2.97 u[IU]/mL (ref 0.450–4.500)

## 2013-08-20 LAB — LYME, WESTERN BLOT, SERUM (REFLEXED)
IGG P58 AB.: ABSENT
IGM P23 AB.: ABSENT
IgG P18 Ab.: ABSENT
IgG P23 Ab.: ABSENT
IgG P28 Ab.: ABSENT
IgG P30 Ab.: ABSENT
IgG P39 Ab.: ABSENT
IgG P66 Ab.: ABSENT
IgG P93 Ab.: ABSENT
IgM P39 Ab.: ABSENT
IgM P41 Ab.: ABSENT
LYME IGG WB: NEGATIVE
LYME IGM WB: NEGATIVE

## 2013-08-20 LAB — SEDIMENTATION RATE: Sed Rate: 13 mm/hr (ref 0–30)

## 2013-08-20 LAB — LYME, TOTAL AB TEST/REFLEX: LYME IGG/IGM AB: 1.3 {ISR} — AB (ref 0.00–0.90)

## 2013-08-20 LAB — VITAMIN B12: Vitamin B-12: 1449 pg/mL — ABNORMAL HIGH (ref 211–946)

## 2013-08-20 LAB — ANGIOTENSIN CONVERTING ENZYME: Angio Convert Enzyme: 41 U/L (ref 14–82)

## 2013-08-20 LAB — ANA W/REFLEX: Anti Nuclear Antibody(ANA): POSITIVE — AB

## 2013-08-20 LAB — ACETYLCHOLINE RECEPTOR, BINDING

## 2013-08-21 ENCOUNTER — Telehealth: Payer: Self-pay | Admitting: Neurology

## 2013-08-21 DIAGNOSIS — H4920 Sixth [abducent] nerve palsy, unspecified eye: Secondary | ICD-10-CM

## 2013-08-21 NOTE — Telephone Encounter (Signed)
I called patient. The blood work is relatively unremarkable with exception of a positive Lyme screen antibody, but the Western blot was negative. The patient also has a positive ANA with elevation in the double-stranded DNA antibody and the RNP antibody. The elevations are significant. A sedimentation rate however, is normal. I have discussed the possibility of a referral to a rheumatologist, and the patient is amenable to this. This positive ANA may not be clinically significant, but I will rely upon the expertise of a rheumatologist to make this determination.

## 2013-08-25 ENCOUNTER — Telehealth: Payer: Self-pay | Admitting: *Deleted

## 2013-08-25 ENCOUNTER — Other Ambulatory Visit: Payer: Self-pay | Admitting: Dermatology

## 2013-08-25 NOTE — Telephone Encounter (Signed)
Spoke with patient's wife Gaspar Skeeters, informed her of patient's upcoming appointment at [facility] with Dr. Amil Amen on October 13, 2013 at 1:30 pm, instructed  to arrive 15 minutes early, also gave the number to Dr. Amil Amen office, in case they need to r/s appointment.

## 2013-09-04 ENCOUNTER — Telehealth: Payer: Self-pay | Admitting: Neurology

## 2013-09-04 NOTE — Telephone Encounter (Signed)
Dr. Melissa Noon assistant call stating that Dr. Amil Amen needed pt's CT results. Pt's information was faxed over to Dr. Melissa Noon office, conformation given.

## 2013-11-27 ENCOUNTER — Encounter: Payer: Self-pay | Admitting: Internal Medicine

## 2013-12-15 ENCOUNTER — Ambulatory Visit: Payer: Medicare Other | Admitting: Adult Health

## 2013-12-16 ENCOUNTER — Ambulatory Visit: Payer: Medicare Other | Admitting: Adult Health

## 2014-01-14 ENCOUNTER — Encounter: Payer: Self-pay | Admitting: Neurology

## 2014-01-20 ENCOUNTER — Encounter: Payer: Self-pay | Admitting: Neurology

## 2014-02-05 ENCOUNTER — Encounter (HOSPITAL_COMMUNITY): Payer: Self-pay | Admitting: Internal Medicine

## 2014-06-16 ENCOUNTER — Other Ambulatory Visit: Payer: Self-pay | Admitting: Dermatology

## 2014-09-19 ENCOUNTER — Telehealth: Payer: Self-pay | Admitting: Physician Assistant

## 2014-09-19 ENCOUNTER — Other Ambulatory Visit: Payer: Self-pay | Admitting: Physician Assistant

## 2014-09-19 DIAGNOSIS — I10 Essential (primary) hypertension: Secondary | ICD-10-CM

## 2014-09-19 MED ORDER — AMLODIPINE BESYLATE 2.5 MG PO TABS: 2.5000 mg | ORAL_TABLET | Freq: Every day | ORAL | Status: DC

## 2014-09-19 NOTE — Telephone Encounter (Signed)
Patient paged complaining of BP of 191/95 and pulse of 83. Patient said he was doing well when seen by Dr. David Stall last week (unable find his note). Yesterday his SBP was 199 when he went to see his urologist. Afterwards he called his PCP and they advice him to drink plenty of water and take extra dose of Losartan 100mg . This morning his BP was 191/95 around 7:45am, he took his Losartan 100mg  and called [place]. The patient denied any chest pain, headache, nausea, vomiting, diarrhea, fever, one side of weakness, palpitation or SOB. His only complain is weakness. Case discussed with Richardson Dopp. His repeat BP was 207/95. He was advice to go nearest fire place to check his blood pressure and if SBP more than 180, go to ER.   Eric Tufano, PA-C

## 2014-09-19 NOTE — Telephone Encounter (Signed)
81 year old male with hypertension, atrial fibrillation, pacemaker. He called in earlier today with complaints of high blood pressures. He had his blood pressure rechecked at the fire station. Blood pressure is now [id number] systolic. He has no complaints. Denies headaches, blurry vision, unilateral weakness, chest pain, dyspnea, syncope, edema.  I reviewed his case with his primary cardiologist [name].  Start amlodipine 2.5 mg daily. Patient has been advised to contact [name] office for an appointment this week.  Richardson Dopp, PA-C   09/19/2014 1:25 PM

## 2014-12-04 IMAGING — CR DG CHEST 1V
1 series · 1 of 1 positions shown · non-contrast
Comparison: PA views same day 07/03/2012, chest radiograph
06/30/2012

CLINICAL DATA: Nausea and vomiting, concern for pneumonia

CHEST - 1 VIEW

[w chest lat]
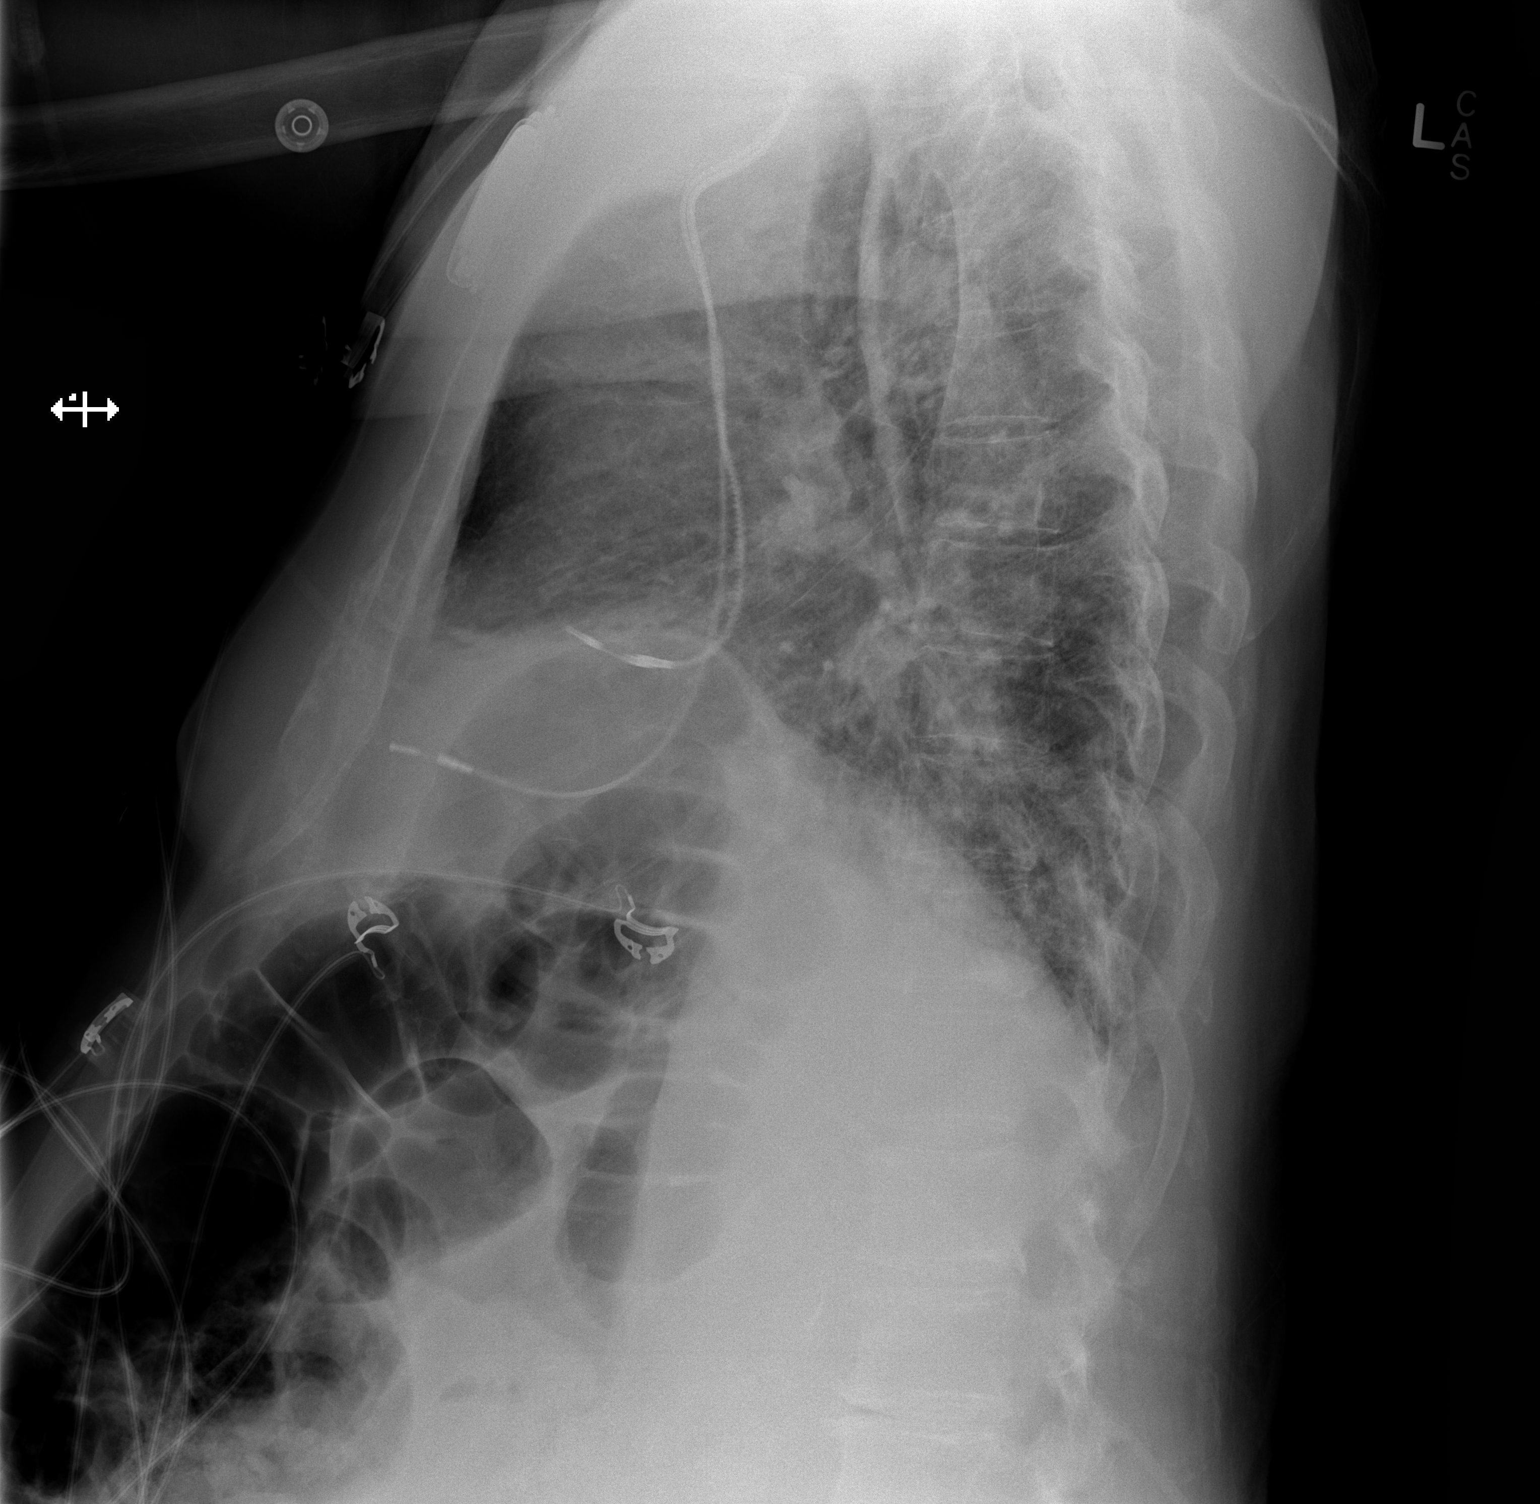

[1 of 1 positions shown; findings below may reference images not displayed]

FINDINGS: ,Compared to chest radiograph of 06/30/2012 there is
increased lower lung density.  This could be on the left or right.
IMPRESSION: Concern for lower lobe pneumonia.

## 2015-04-10 IMAGING — CR DG CHEST 2V
2 series · 2 of 2 positions shown · non-contrast
Comparison: July 04, 2012

CLINICAL DATA: Congestive heart failure.

CHEST - 2 VIEW

[w chest pa]
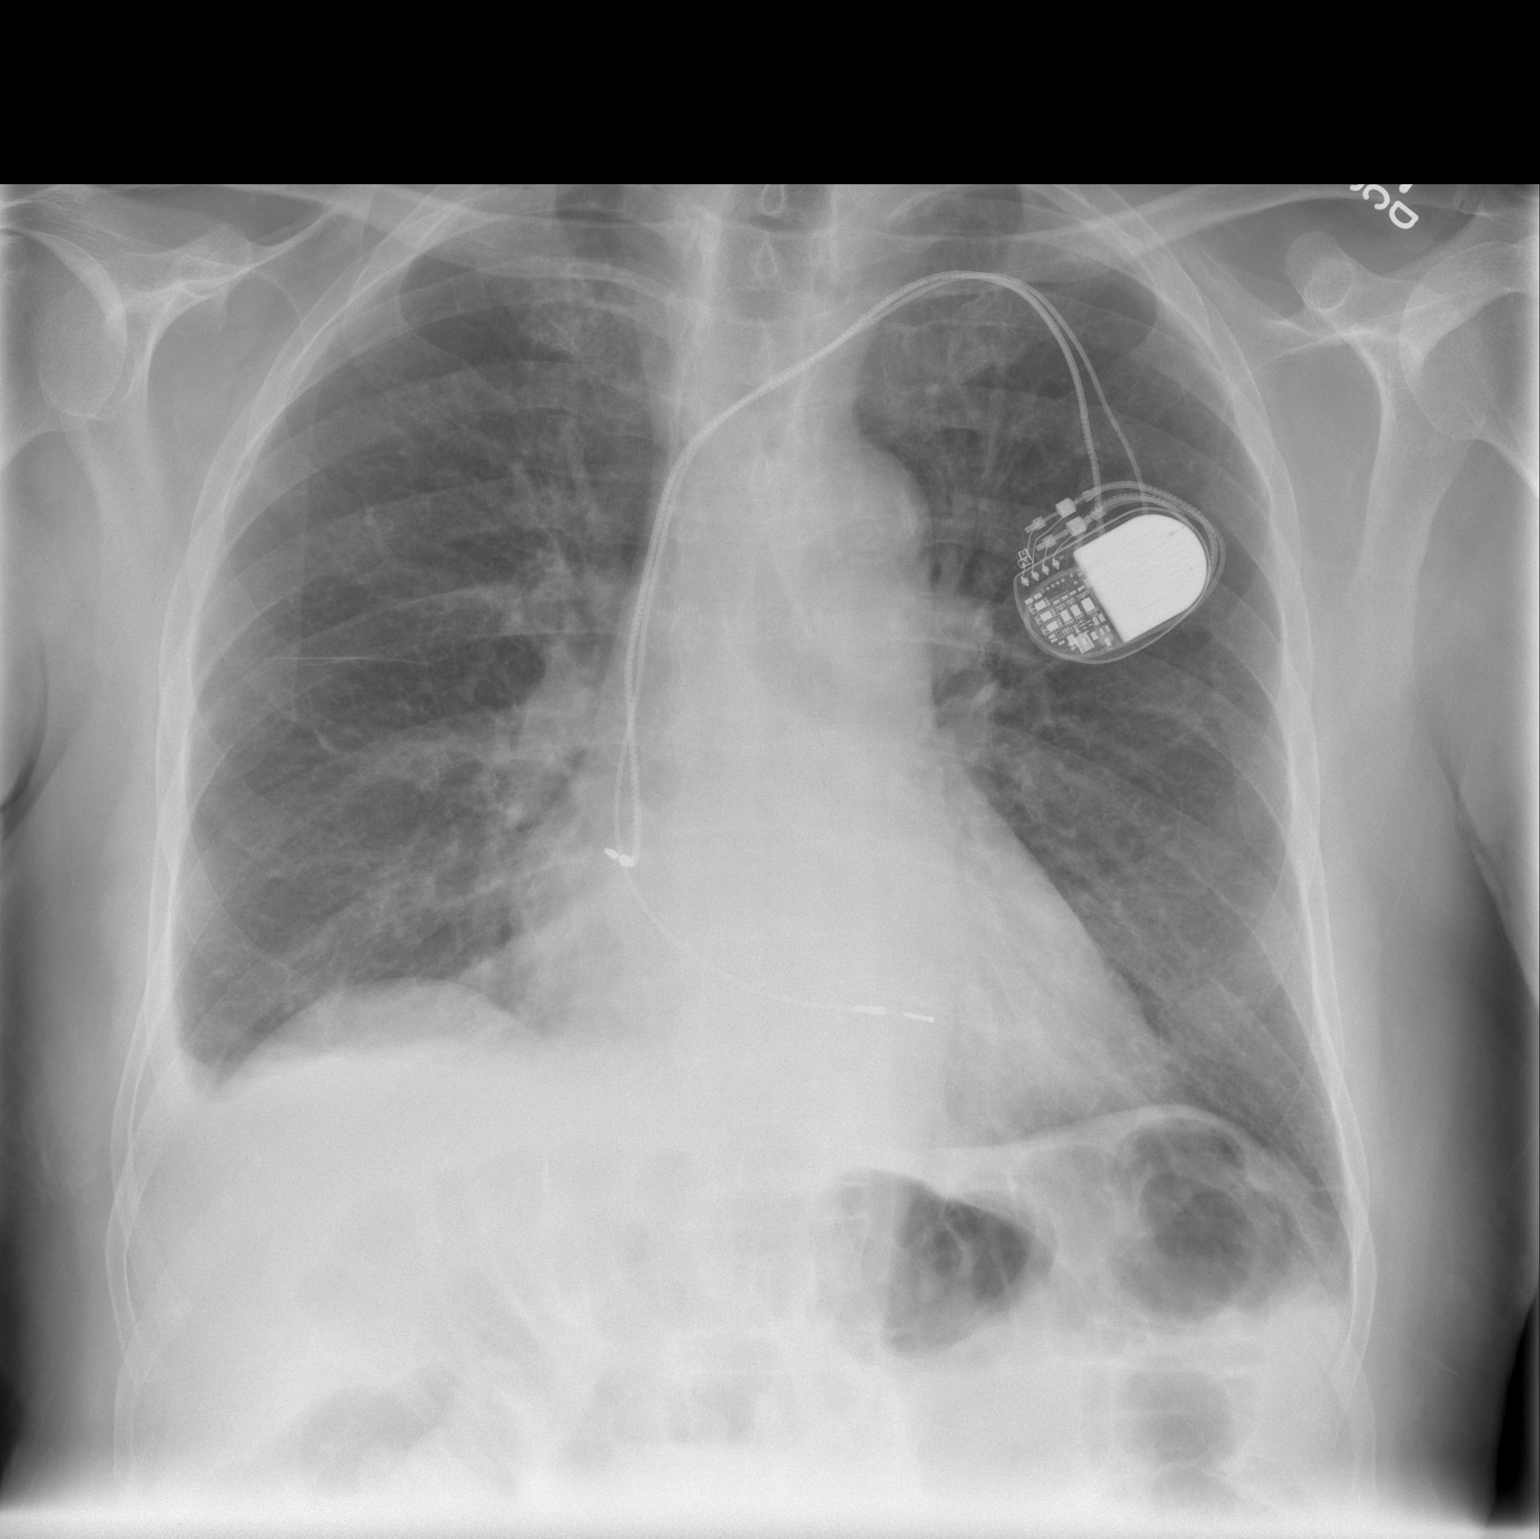

[w chest lat]
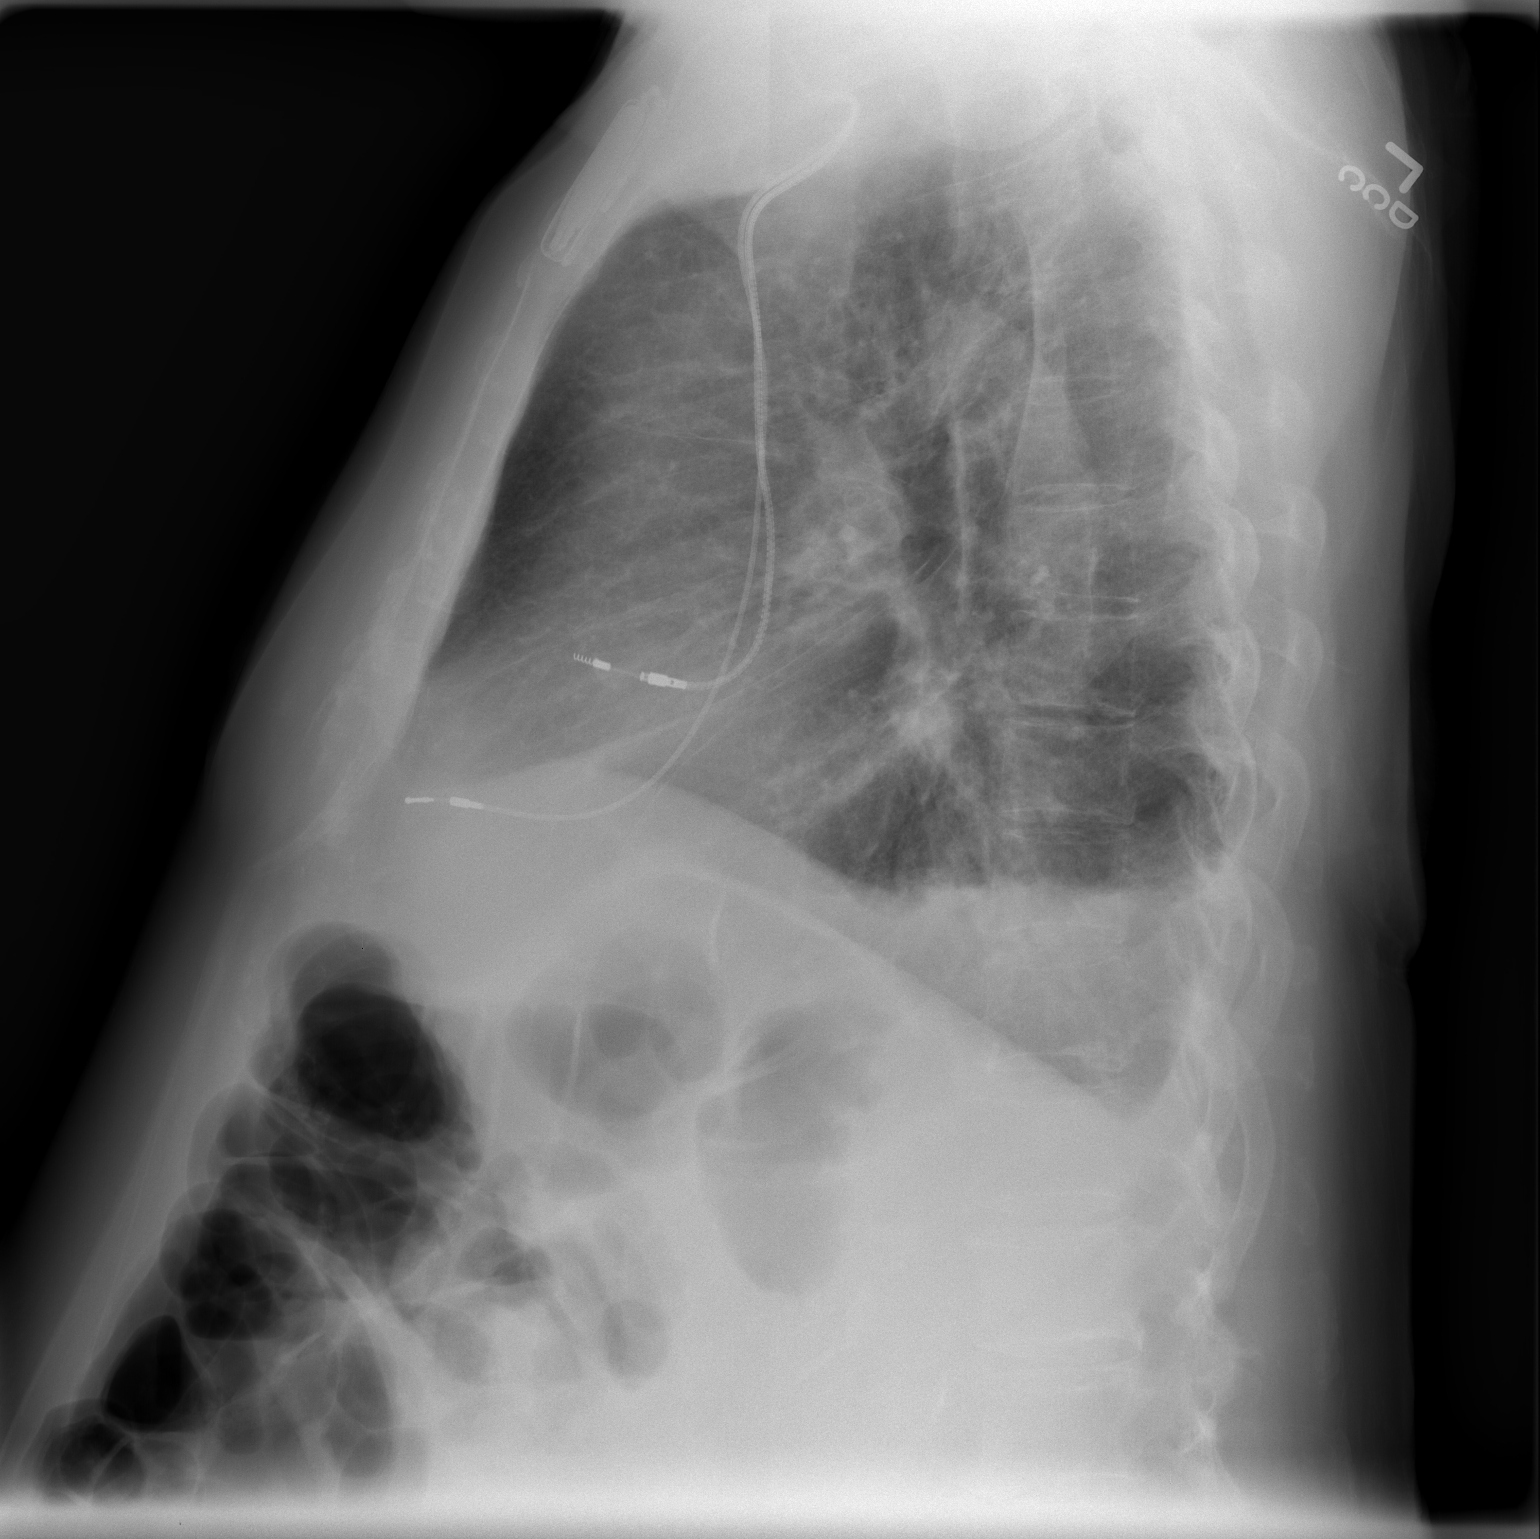

[2 of 2 positions shown; findings below may reference images not displayed]

FINDINGS: There are small bilateral pleural effusions, right
greater than left.  There is no pulmonary edema.  No focal
pneumonia is identified.  The mediastinal contour and cardiac
silhouette are stable.  Dual lead cardiac pacemaker is unchanged.
The soft tissues and osseous structures are stable.
IMPRESSION: No evidence of congestive heart failure.  Small bilateral pleural
effusions, right greater than left.

## 2015-04-22 ENCOUNTER — Emergency Department (HOSPITAL_COMMUNITY): Payer: Medicare Other

## 2015-04-22 ENCOUNTER — Emergency Department (HOSPITAL_COMMUNITY)
Admission: EM | Admit: 2015-04-22 | Discharge: 2015-04-22 | Disposition: A | Discharge status: Home / Self Care | Payer: Medicare Other | Attending: Emergency Medicine | Admitting: Emergency Medicine

## 2015-04-22 ENCOUNTER — Encounter (HOSPITAL_COMMUNITY): Payer: Self-pay | Admitting: *Deleted

## 2015-04-22 DIAGNOSIS — N400 Enlarged prostate without lower urinary tract symptoms: Secondary | ICD-10-CM | POA: Insufficient documentation

## 2015-04-22 DIAGNOSIS — R300 Dysuria: Secondary | ICD-10-CM | POA: Insufficient documentation

## 2015-04-22 DIAGNOSIS — R05 Cough: Secondary | ICD-10-CM | POA: Insufficient documentation

## 2015-04-22 DIAGNOSIS — Z7901 Long term (current) use of anticoagulants: Secondary | ICD-10-CM | POA: Diagnosis not present

## 2015-04-22 DIAGNOSIS — I5042 Chronic combined systolic (congestive) and diastolic (congestive) heart failure: Secondary | ICD-10-CM | POA: Diagnosis not present

## 2015-04-22 DIAGNOSIS — R1110 Vomiting, unspecified: Secondary | ICD-10-CM | POA: Insufficient documentation

## 2015-04-22 DIAGNOSIS — R197 Diarrhea, unspecified: Secondary | ICD-10-CM | POA: Insufficient documentation

## 2015-04-22 DIAGNOSIS — Z7951 Long term (current) use of inhaled steroids: Secondary | ICD-10-CM | POA: Insufficient documentation

## 2015-04-22 DIAGNOSIS — R140 Abdominal distension (gaseous): Secondary | ICD-10-CM | POA: Insufficient documentation

## 2015-04-22 DIAGNOSIS — I4891 Unspecified atrial fibrillation: Secondary | ICD-10-CM | POA: Diagnosis not present

## 2015-04-22 DIAGNOSIS — E669 Obesity, unspecified: Secondary | ICD-10-CM | POA: Insufficient documentation

## 2015-04-22 DIAGNOSIS — Z6841 Body Mass Index (BMI) 40.0 and over, adult: Secondary | ICD-10-CM | POA: Diagnosis not present

## 2015-04-22 DIAGNOSIS — E039 Hypothyroidism, unspecified: Secondary | ICD-10-CM | POA: Insufficient documentation

## 2015-04-22 DIAGNOSIS — I10 Essential (primary) hypertension: Secondary | ICD-10-CM | POA: Diagnosis not present

## 2015-04-22 DIAGNOSIS — R079 Chest pain, unspecified: Secondary | ICD-10-CM | POA: Insufficient documentation

## 2015-04-22 DIAGNOSIS — J45909 Unspecified asthma, uncomplicated: Secondary | ICD-10-CM | POA: Diagnosis not present

## 2015-04-22 DIAGNOSIS — K219 Gastro-esophageal reflux disease without esophagitis: Secondary | ICD-10-CM | POA: Diagnosis not present

## 2015-04-22 DIAGNOSIS — R630 Anorexia: Secondary | ICD-10-CM | POA: Insufficient documentation

## 2015-04-22 DIAGNOSIS — Z87891 Personal history of nicotine dependence: Secondary | ICD-10-CM | POA: Insufficient documentation

## 2015-04-22 DIAGNOSIS — R509 Fever, unspecified: Secondary | ICD-10-CM | POA: Diagnosis not present

## 2015-04-22 DIAGNOSIS — M810 Age-related osteoporosis without current pathological fracture: Secondary | ICD-10-CM | POA: Insufficient documentation

## 2015-04-22 DIAGNOSIS — Z8669 Personal history of other diseases of the nervous system and sense organs: Secondary | ICD-10-CM | POA: Insufficient documentation

## 2015-04-22 DIAGNOSIS — Z79899 Other long term (current) drug therapy: Secondary | ICD-10-CM | POA: Diagnosis not present

## 2015-04-22 LAB — CBC WITH DIFFERENTIAL/PLATELET
BASOS ABS: 0.1 10*3/uL (ref 0.0–0.1)
BASOS PCT: 1 %
Eosinophils Absolute: 0.3 10*3/uL (ref 0.0–0.7)
Eosinophils Relative: 3 %
HEMATOCRIT: 42.3 % (ref 39.0–52.0)
HEMOGLOBIN: 15 g/dL (ref 13.0–17.0)
LYMPHS PCT: 28 %
Lymphs Abs: 3.1 10*3/uL (ref 0.7–4.0)
MCH: 31.1 pg (ref 26.0–34.0)
MCHC: 35.5 g/dL (ref 30.0–36.0)
MCV: 87.8 fL (ref 78.0–100.0)
MONO ABS: 1 10*3/uL (ref 0.1–1.0)
MONOS PCT: 9 %
NEUTROS ABS: 6.8 10*3/uL (ref 1.7–7.7)
NEUTROS PCT: 59 %
Platelets: 233 10*3/uL (ref 150–400)
RBC: 4.82 MIL/uL (ref 4.22–5.81)
RDW: 12.5 % (ref 11.5–15.5)
WBC: 11.2 10*3/uL — ABNORMAL HIGH (ref 4.0–10.5)

## 2015-04-22 LAB — COMPREHENSIVE METABOLIC PANEL
ALBUMIN: 4.3 g/dL (ref 3.5–5.0)
ALT: 24 U/L (ref 17–63)
ANION GAP: 11 (ref 5–15)
AST: 33 U/L (ref 15–41)
Alkaline Phosphatase: 54 U/L (ref 38–126)
BUN: 19 mg/dL (ref 6–20)
CO2: 24 mmol/L (ref 22–32)
Calcium: 9.2 mg/dL (ref 8.9–10.3)
Chloride: 97 mmol/L — ABNORMAL LOW (ref 101–111)
Creatinine, Ser: 1.29 mg/dL — ABNORMAL HIGH (ref 0.61–1.24)
GFR calc non Af Amer: 47 mL/min — ABNORMAL LOW (ref >60)
GFR, EST AFRICAN AMERICAN: 55 mL/min — AB (ref >60)
GLUCOSE: 107 mg/dL — AB (ref 65–99)
POTASSIUM: 3.8 mmol/L (ref 3.5–5.1)
SODIUM: 132 mmol/L — AB (ref 135–145)
Total Bilirubin: 0.6 mg/dL (ref 0.3–1.2)
Total Protein: 8 g/dL (ref 6.5–8.1)

## 2015-04-22 LAB — PROTIME-INR
INR: 2.25 — AB (ref 0.00–1.49)
Prothrombin Time: 23.9 seconds — ABNORMAL HIGH (ref 11.6–15.2)

## 2015-04-22 LAB — URINALYSIS, ROUTINE W REFLEX MICROSCOPIC
Bilirubin Urine: NEGATIVE
Glucose, UA: NEGATIVE mg/dL
HGB URINE DIPSTICK: NEGATIVE
Ketones, ur: NEGATIVE mg/dL
Leukocytes, UA: NEGATIVE
Nitrite: NEGATIVE
PH: 6 (ref 5.0–8.0)
Protein, ur: NEGATIVE mg/dL
SPECIFIC GRAVITY, URINE: 1.008 (ref 1.005–1.030)

## 2015-04-22 LAB — I-STAT CG4 LACTIC ACID, ED: LACTIC ACID, VENOUS: 1 mmol/L (ref 0.5–2.0)

## 2015-04-22 MED ORDER — IOHEXOL 300 MG/ML  SOLN
25.0000 mL | Freq: Once | INTRAMUSCULAR | Status: AC | PRN
  Administered 2015-04-22: 25 mL via ORAL

## 2015-04-22 NOTE — ED Provider Notes (Signed)
CSN: [id number]     Arrival date & time 04/22/15  1809 History   First MD Initiated Contact with Patient 04/22/15 1842     Chief Complaint  Patient presents with  . Bloated  . sent by pcp      The history is provided by the patient. No language interpreter was used.    [name] is a 82 y.o. male who presents to the Emergency Department complaining of referred by PCP for pneumoperitoneum.  Patient was seen by his primary care provider for 3 weeks of abdominal bloating and one week of generalized body aches, cough, flulike symptoms. In the office he had a chest x-ray that was concerning for free air. He had an acute abdominal series that was also consistent with free air and he was referred to the emergency department for further evaluation. Fevers, chest pain, vomiting, diarrhea, dysuria, change in appetite.  Past Medical History  Diagnosis Date  . Hypertension   . Pacemaker-Medtronic 2005    generator change  . GERD (gastroesophageal reflux disease)   . Atrial fibrillation (HCC)      on coumadin    . Atrioventricular block, complete ([place])   . Gout   . BPH (benign prostatic hyperplasia)   . Hypothyroid   . Sinusitis   . Osteoporosis   . Hyponatremia 2014    HCTZ  . Lumbar disc disease   . Obesity (BMI 30-39.9)   . Orchitis of right testicle   . Anticoagulant long-term use   . Chronic combined systolic and diastolic CHF (congestive heart failure) (HCC)     EF 40% by ECHO  8/14  - followed by [name]  . Asthma     until age 43   . Encounter for care or replacement of suprapubic tube ([place])   . 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 Diplopia 08/14/2013  . HOH (hard of hearing)     hearing aids  . Macular degeneration    Past Surgical History  Procedure Laterality Date  . Pacemaker insertion    . Ablation  1990  . Inguinal hernia repair  1969  . Transurethral resection of prostate    . Insertion of suprapubic catheter N/A 12/11/2012    Procedure: INSERTION  OF SUPRAPUBIC CATHETER " NEEDS PERC BALLOON LIKE FOR PERC KIDNEY";  Surgeon: [name], MD;  Location: [facility];  Service: Urology;  Laterality: N/A;  . Cystoscopy N/A 12/11/2012    Procedure: CYSTOSCOPY;  Surgeon: [name], MD;  Location: [facility];  Service: Urology;  Laterality: N/A;  . Suprpubic tube    . Robot assisted laparoscopic radical prostatectomy N/A 02/10/2013    Procedure: ROBOTIC ASSISTED LAPAROSCOPIC SIMPLE PROSTATECTOMY;  Surgeon: [name], MD;  Location: [facility];  Service: Urology;  Laterality: N/A;  . Cataract extraction Bilateral   . Pacemaker generator change N/A 07/19/2011    Procedure: PACEMAKER GENERATOR CHANGE;  Surgeon: [name], MD;  [facility];  Service: Cardiovascular;  Laterality: N/A;   Family History  Problem Relation Age of Onset  . Heart disease Mother   . Rheum arthritis Mother   . Deep vein thrombosis Sister    Social History  Substance Use Topics  . Smoking status: Former Smoker -- 1.00 packs/day for 2 years    Types: Cigarettes    Quit date: 07/17/1941  . Smokeless tobacco: Never Used  . Alcohol Use: No    Review of Systems  All other systems reviewed and  are negative.     Allergies  Hctz  Home Medications   Prior to Admission medications   Medication Sig Start Date End Date Taking? Authorizing Provider  alendronate (FOSAMAX) 70 MG tablet Take 70 mg by mouth every 7 (seven) days.    Yes Historical Provider, MD  amLODipine (NORVASC) 5 MG tablet Take 5 mg by mouth daily.  03/13/15  Yes Historical Provider, MD  azithromycin (ZITHROMAX) 250 MG tablet Take 250-500 mg by mouth daily. ABT Start Date 04/20/15 & End Date 04/23/15. 04/20/15  Yes Historical Provider, MD  beta carotene w/minerals (OCUVITE) tablet Take 1 tablet by mouth at bedtime.   Yes Historical Provider, MD  budesonide-formoterol (SYMBICORT) 160-4.5 MCG/ACT inhaler Inhale 2 puffs into the lungs 2 (two) times daily.   Yes Historical Provider, MD  Calcium  Carbonate-Vitamin D (CALCIUM 600 + D PO) Take 1 tablet by mouth daily.   Yes Historical Provider, MD  cholecalciferol (VITAMIN D) 1000 UNITS tablet Take 2,000 Units by mouth daily.   Yes Historical Provider, MD  finasteride (PROSCAR) 5 MG tablet Take 5 mg by mouth daily.    Yes Historical Provider, MD  flurazepam (DALMANE) 30 MG capsule Take 30 mg by mouth at bedtime as needed for sleep.    Yes Historical Provider, MD  furosemide (LASIX) 40 MG tablet Take 40 mg by mouth daily.  03/13/15  Yes Historical Provider, MD  levothyroxine (SYNTHROID, LEVOTHROID) 75 MCG tablet Take 75 mcg by mouth daily before breakfast.   Yes Historical Provider, MD  losartan (COZAAR) 100 MG tablet Take 100 mg by mouth every morning.  07/28/10  Yes Historical Provider, MD  mometasone (NASONEX) 50 MCG/ACT nasal spray Place 2 sprays into the nose daily as needed (for congestion).    Yes Historical Provider, MD  Multiple Vitamin (MULTIVITAMIN WITH MINERALS) TABS tablet Take 1 tablet by mouth daily.   Yes Historical Provider, MD  omega-3 acid ethyl esters (LOVAZA) 1 G capsule Take 1 g by mouth every morning.   Yes Historical Provider, MD  omeprazole (PRILOSEC) 20 MG capsule Take 20 mg by mouth at bedtime.    Yes Historical Provider, MD  traMADol (ULTRAM) 50 MG tablet Take 1 tablet (50 mg total) by mouth every 6 (six) hours as needed for moderate pain. Post-operatively. 02/14/13  Yes [name], MD  valsartan (DIOVAN) 320 MG tablet Take 320 mg by mouth at bedtime.  03/13/15  Yes Historical Provider, MD  warfarin (COUMADIN) 3 MG tablet Take 1.5-3 mg by mouth daily. Takes 0.5 tablet (1.5 mg) on Wednesday and Saturday and Take 1 tablet (3 mg) on all other days.   Yes Historical Provider, MD  amLODipine (NORVASC) 2.5 MG tablet Take 1 tablet (2.5 mg total) by mouth daily. Patient not taking: Reported on 04/22/2015 09/19/14   Liliane Shi, PA-C   BP 170/81 mmHg  Pulse 69  Temp(Src) 98 F (36.7 C) (Oral)  Resp 13  SpO2  94% Physical Exam  Constitutional: He is oriented to person, place, and time. He appears well-developed and well-nourished.  HENT:  Head: Normocephalic and atraumatic.  Cardiovascular: Normal rate and regular rhythm.   No murmur heard. Pulmonary/Chest: Effort normal and breath sounds normal. No respiratory distress.  Abdominal: There is no rebound and no guarding.  Abdominal bloating, decreased bowel sounds  Musculoskeletal: He exhibits no edema or tenderness.  Neurological: He is alert and oriented to person, place, and time.  Skin: Skin is warm and dry.  Psychiatric: He has a normal mood  and affect. His behavior is normal.  Nursing note and vitals reviewed.   ED Course  Procedures (including critical care time) Labs Review Labs Reviewed  COMPREHENSIVE METABOLIC PANEL - Abnormal; Notable for the following:    Sodium 132 (*)    Chloride 97 (*)    Glucose, Bld 107 (*)    Creatinine, Ser 1.29 (*)    GFR calc non Af Amer 47 (*)    GFR calc Af Amer 55 (*)    All other components within normal limits  CBC WITH DIFFERENTIAL/PLATELET - Abnormal; Notable for the following:    WBC 11.2 (*)    All other components within normal limits  PROTIME-INR - Abnormal; Notable for the following:    Prothrombin Time 23.9 (*)    INR 2.25 (*)    All other components within normal limits  URINE CULTURE  URINALYSIS, ROUTINE W REFLEX MICROSCOPIC (NOT AT [facility])  I-STAT CG4 LACTIC ACID, ED    Imaging Review Ct Abdomen Pelvis Wo Contrast  04/22/2015  CLINICAL DATA:  Pt sent by his PCP for evaluation with CT and surgical consult due to x-ray showing pneumoperitoneum. Pt complains of distension and increased bloating, denies pain. EXAM: CT ABDOMEN AND PELVIS WITHOUT CONTRAST TECHNIQUE: Multidetector CT imaging of the abdomen and pelvis was performed following the standard protocol without IV contrast. COMPARISON:  10/25/2012 FINDINGS: There is no pneumoperitoneum. Lung bases: Reticular type lung base  opacities consistent with combination scarring and subsegmental atelectasis. No convincing pneumonia or pulmonary edema. Heart is borderline enlarged. There are dense coronary artery calcifications. Liver: 16 mm low-density lesion in the left liver lobe consistent with a cyst, 2 mm larger than on the prior CT. No other liver abnormality. Spleen and gallbladder:  Unremarkable.  No bile duct dilation. Pancreas: Atrophy partial fatty replacement. Otherwise unremarkable. Stable prior study. Adrenal glands:  No masses. Kidneys, ureters, bladder: Low-density lower pole right renal lesions consistent with cysts. Several hypoattenuating small lesions are noted in each kidney consistent with cysts complicated by previous hemorrhage. Fat density lesion measuring 9 mm in the lower pole the right kidney consistent with an angiomyolipoma. No stones. No hydronephrosis ureters normal course and in caliber. Bladder is displaced by a markedly enlarged prostate. Prostate gland:  Markedly enlarged measuring 9.1 x 8.7 x 9.0 cm. Lymph nodes:  No adenopathy. Ascites:  None. Vascular: Dense atherosclerotic calcifications noted along a normal caliber abdominal aorta and iliac vessels. Gastrointestinal: Small hiatal hernia. Stomach otherwise unremarkable. Normal small bowel. Mild increased stool in the right colon. Scattered colonic diverticula. No colon wall thickening or inflammation. Abdominal wall: Small fat containing umbilical hernia. Musculoskeletal: Degenerative changes throughout the visualized spine. No osteoblastic or osteolytic lesions. IMPRESSION: 1. No pneumoperitoneum. 2. No acute findings within the abdomen or pelvis. 3. Markedly enlarged prostate gland. This is similar to the prior CT. 4. Simple and mildly complicated renal cysts similar to the prior study. Right renal angiomyolipoma. 5. Scattered colonic diverticula without diverticulitis. Electronically Signed   By: Lajean Manes M.D.   On: 04/22/2015 20:30   I have  personally reviewed and evaluated these images and lab results as part of my medical decision-making.   EKG Interpretation   Date/Time:  Thursday April 22 2015 19:10:13 EST Ventricular Rate:  71 PR Interval:    QRS Duration: 188 QT Interval:  476 QTC Calculation: 517 R Axis:   -78 Text Interpretation:  Ventricular-paced rhythm Baseline wander in lead(s)  II III aVF Confirmed by Hazle Coca [id number] on 04/22/2015  7:20:57 PM      MDM   Final diagnoses:  Abdominal bloating    Pt referred to the ED for xray concerning for free air.  He has a benign abdominal exam and no current significant sxs.  CT scan is negatiave for acute process.  He has an enlarged prostate that he is already aware of and has no difficulty urinating or dysuria.  Pt d/cd home with close outpatient follow up.  Return precautions discussed.      Quintella Reichert, MD 04/23/15 [phone]

## 2015-04-22 NOTE — Consult Note (Signed)
Re:   [name] DOB:   [birth year] MRN:   [id number]   Consultation Note  ASSESSMENT AND PLAN: 1.  Possible pneumoperitoneum  CT scan shows no pneumoperitoneum.  Dr. Ralene Bathe, the [facility] physician, and I have spoken.  She will let him go home.  I spoke to Dr. Joylene Draft about the results by phone.  2.  Recent cold/flu like symptoms  3.  Anticoagulated  On coumadin 4.  HTN 5.  A Fib 6.  Pacemaker for complete heart block 7.  Gout 8.  History of prostatectomy - Dr. Tresa Moore    Has large prostate on current CT scan  Chief Complaint  Patient presents with  . Bloated  . sent by pcp    REFERRING PHYSICIAN: [name], MD  HISTORY OF PRESENT ILLNESS: [name] is a 82 y.o. (DOB: [birth year])  white  male whose primary care physician is [name], MD.    Dr. Brigitte Pulse spoke to me in the office about [name].  He has had a one-week course of vague flu like symptoms.  Dr. Lang Snow obtained a chest x-ray which showed a pneumoperitoneum.  We agreed his best course was evaluation in the emergency room with CT scan.  The patient is apparently asymptomatic except for an abdominal process other than distention.  CT scan - 04/22/2015 - 1.  No pneumoperitoneum.  2. Markedly enlarged prostate gland. This is similar to the prior CT.  3. Simple and mildly complicated renal cysts similar to the prior study. Right renal angiomyolipoma.  4. Scattered colonic diverticula without diverticulitis.   With a negative CT scan, I think he can go home. I spoke to his son, Mikki Santee, [phone].  I discussed the case with Dr. Ralene Bathe.  Past Medical History  Diagnosis Date  . Hypertension   . Pacemaker-Medtronic 2005    generator change  . GERD (gastroesophageal reflux disease)   . Atrial fibrillation (HCC)      on coumadin    . Atrioventricular block, complete ([place])   . Gout   . BPH (benign prostatic hyperplasia)   . Hypothyroid   . Sinusitis   . Osteoporosis   . Hyponatremia 2014    HCTZ    . Lumbar disc disease   . Obesity (BMI 30-39.9)   . Orchitis of right testicle   . Anticoagulant long-term use   . Chronic combined systolic and diastolic CHF (congestive heart failure) (HCC)     EF 40% by ECHO  8/14  - followed by [name]  . Asthma     until age 89   . Encounter for care or replacement of suprapubic tube ([place])   . 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 Diplopia 08/14/2013  . HOH (hard of hearing)     hearing aids  . Macular degeneration       Past Surgical History  Procedure Laterality Date  . Pacemaker insertion    . Ablation  1990  . Inguinal hernia repair  1969  . Transurethral resection of prostate    . Insertion of suprapubic catheter N/A 12/11/2012    Procedure: INSERTION OF SUPRAPUBIC CATHETER " NEEDS PERC BALLOON LIKE FOR PERC KIDNEY";  Surgeon: [name], MD;  Location: [facility];  Service: Urology;  Laterality: N/A;  . Cystoscopy N/A 12/11/2012    Procedure: CYSTOSCOPY;  Surgeon: [name], MD;  Location: [facility];  Service: Urology;  Laterality: N/A;  . Suprpubic tube    . Robot assisted  laparoscopic radical prostatectomy N/A 02/10/2013    Procedure: ROBOTIC ASSISTED LAPAROSCOPIC SIMPLE PROSTATECTOMY;  Surgeon: [name], MD;  Location: [facility];  Service: Urology;  Laterality: N/A;  . Cataract extraction Bilateral   . Pacemaker generator change N/A 07/19/2011    Procedure: PACEMAKER GENERATOR CHANGE;  Surgeon: [name], MD;  Location: [facility] CATH LAB;  Service: Cardiovascular;  Laterality: N/A;      No current facility-administered medications for this encounter.   Current Outpatient Prescriptions  Medication Sig Dispense Refill  . alendronate (FOSAMAX) 70 MG tablet Take 70 mg by mouth every 7 (seven) days. Take with a full glass of water on an empty stomach.  Taken on Saturday.    [name] amLODipine (NORVASC) 2.5 MG tablet Take 1 tablet (2.5 mg total) by mouth daily. 30 tablet 0  . beta carotene w/minerals (OCUVITE) tablet Take 1  tablet by mouth at bedtime.    . budesonide-formoterol (SYMBICORT) 160-4.5 MCG/ACT inhaler Inhale 2 puffs into the lungs 2 (two) times daily.    . Calcium Carbonate-Vitamin D (CALCIUM 600 + D PO) Take 1 tablet by mouth daily.    . cholecalciferol (VITAMIN D) 1000 UNITS tablet Take 2,000 Units by mouth daily.    [name] docusate sodium (COLACE) 100 MG capsule Take 100 mg by mouth 2 (two) times daily as needed for constipation.    . finasteride (PROSCAR) 5 MG tablet Take 5 mg by mouth daily.     . flurazepam (DALMANE) 30 MG capsule Take 30 mg by mouth at bedtime as needed for sleep.     [name] levothyroxine (SYNTHROID, LEVOTHROID) 75 MCG tablet Take 75 mcg by mouth daily before breakfast.    . losartan (COZAAR) 100 MG tablet Take 100 mg by mouth every morning.     . mometasone (NASONEX) 50 MCG/ACT nasal spray Place 2 sprays into the nose daily as needed (for congestion).     . Multiple Vitamin (MULTIVITAMIN WITH MINERALS) TABS tablet Take 1 tablet by mouth daily.    [name] omega-3 acid ethyl esters (LOVAZA) 1 G capsule Take 1 g by mouth every morning.    [name] omeprazole (PRILOSEC) 20 MG capsule Take 20 mg by mouth at bedtime.     . traMADol (ULTRAM) 50 MG tablet Take 1 tablet (50 mg total) by mouth every 6 (six) hours as needed for moderate pain. Post-operatively. 30 tablet 0  . warfarin (COUMADIN) 3 MG tablet Take 1.5-3 mg by mouth daily. Takes 1/2 tablet on Wednesday and 1 tablet all other days        Allergies  Allergen Reactions  . Hctz [Hydrochlorothiazide]     Hyponatremia     SOCIAL and FAMILY HISTORY: I spoke to his son, Mikki Santee, [phone].   PHYSICAL EXAM: BP 151/100 mmHg  Pulse 78  Temp(Src) 98 F (36.7 C) (Oral)  Resp 20  SpO2 98%  General: WN older WM who is alert and generally healthy appearing.  HEENT: Normal. Pupils equal. Neck: Supple. No mass.  No thyroid mass. Lymph Nodes:  No supraclavicular or cervical nodes. Lungs: Clear to auscultation and symmetric breath sounds. Heart:   Irregular rythm. No murmur or rub. Abdomen: Soft. No mass. He has absolutely no tenderness. No hernia. Normal bowel sounds.    Extremities:  Good strength and ROM  in upper and lower extremities.  DATA REVIEWED: Epic notes  Alphonsa Overall, MD,  [facility], [place] [address]  [place], [place]    [place]  Phone:  [phone] FAX:  [phone]

## 2015-04-22 NOTE — ED Notes (Signed)
Pt sent by his PCP for evaluation with CT and surgical consult due to x-ray showing pneumoperitoneum. Pt complains of distension and increased bloating, denies pain.

## 2015-04-22 NOTE — Discharge Instructions (Signed)
Your INR was 2.25 today.  You left the Emergency Department before your urinalysis was completed.  Please follow up with your family doctor regarding the results.  Get rechecked immediately if you develop any new or worrisome symptoms.

## 2015-04-24 LAB — URINE CULTURE

## 2015-04-26 ENCOUNTER — Telehealth (HOSPITAL_COMMUNITY): Payer: Self-pay

## 2015-04-26 NOTE — Telephone Encounter (Signed)
Post ED Visit - Positive Culture Follow-up  Culture report reviewed by antimicrobial stewardship pharmacist:  []  Elenor Quinones, Pharm.D. []  Heide Guile, Pharm.D., BCPS []  Parks Neptune, Pharm.D. []  Alycia Rossetti, Pharm.D., BCPS []  [place], [place].D., BCPS, AAHIVP []  Legrand Como, Pharm.D., BCPS, AAHIVP []  Milus Glazier, Pharm.D. []  Stephens November, Pharm.D. Ulyess Mort Rumbarger, Pharm.D.  Positive urine culture, >/= 100,000 colonies -> Group B Strep  Treated with Azithromycin, Chart reviewed by K. Rose No treatment  Dortha Kern 04/26/2015, 5:57 AM

## 2015-06-22 ENCOUNTER — Ambulatory Visit: Payer: PRIVATE HEALTH INSURANCE | Admitting: Pharmacist Clinician (PhC)/ Clinical Pharmacy Specialist

## 2015-12-23 ENCOUNTER — Other Ambulatory Visit: Payer: Self-pay | Admitting: Cardiology

## 2015-12-23 ENCOUNTER — Ambulatory Visit
Admission: RE | Admit: 2015-12-23 | Discharge: 2015-12-23 | Disposition: A | Discharge status: Home / Self Care | Payer: Medicare Other | Source: Ambulatory Visit | Attending: Cardiology | Admitting: Cardiology

## 2015-12-23 DIAGNOSIS — R0602 Shortness of breath: Secondary | ICD-10-CM

## 2016-10-06 ENCOUNTER — Emergency Department (HOSPITAL_COMMUNITY)
Admission: EM | Admit: 2016-10-06 | Discharge: 2016-10-06 | Disposition: A | Discharge status: Home / Self Care | Payer: Medicare Other | Attending: Emergency Medicine | Admitting: Emergency Medicine

## 2016-10-06 ENCOUNTER — Emergency Department (HOSPITAL_COMMUNITY): Payer: Medicare Other

## 2016-10-06 DIAGNOSIS — N400 Enlarged prostate without lower urinary tract symptoms: Secondary | ICD-10-CM | POA: Insufficient documentation

## 2016-10-06 DIAGNOSIS — Z7901 Long term (current) use of anticoagulants: Secondary | ICD-10-CM | POA: Diagnosis not present

## 2016-10-06 DIAGNOSIS — Z87891 Personal history of nicotine dependence: Secondary | ICD-10-CM | POA: Diagnosis not present

## 2016-10-06 DIAGNOSIS — Z79899 Other long term (current) drug therapy: Secondary | ICD-10-CM | POA: Insufficient documentation

## 2016-10-06 DIAGNOSIS — K5900 Constipation, unspecified: Secondary | ICD-10-CM | POA: Insufficient documentation

## 2016-10-06 DIAGNOSIS — I110 Hypertensive heart disease with heart failure: Secondary | ICD-10-CM | POA: Insufficient documentation

## 2016-10-06 DIAGNOSIS — N2889 Other specified disorders of kidney and ureter: Secondary | ICD-10-CM | POA: Diagnosis not present

## 2016-10-06 DIAGNOSIS — J45909 Unspecified asthma, uncomplicated: Secondary | ICD-10-CM | POA: Insufficient documentation

## 2016-10-06 DIAGNOSIS — E039 Hypothyroidism, unspecified: Secondary | ICD-10-CM | POA: Diagnosis not present

## 2016-10-06 DIAGNOSIS — I5042 Chronic combined systolic (congestive) and diastolic (congestive) heart failure: Secondary | ICD-10-CM | POA: Diagnosis not present

## 2016-10-06 DIAGNOSIS — R110 Nausea: Secondary | ICD-10-CM | POA: Diagnosis present

## 2016-10-06 LAB — PROTIME-INR
INR: 1.87
Prothrombin Time: 21.8 seconds — ABNORMAL HIGH (ref 11.4–15.2)

## 2016-10-06 LAB — COMPREHENSIVE METABOLIC PANEL
ALT: 13 U/L — AB (ref 17–63)
AST: 18 U/L (ref 15–41)
Albumin: 3.7 g/dL (ref 3.5–5.0)
Alkaline Phosphatase: 42 U/L (ref 38–126)
Anion gap: 8 (ref 5–15)
BUN: 22 mg/dL — AB (ref 6–20)
CHLORIDE: 101 mmol/L (ref 101–111)
CO2: 28 mmol/L (ref 22–32)
Calcium: 9 mg/dL (ref 8.9–10.3)
Creatinine, Ser: 1.44 mg/dL — ABNORMAL HIGH (ref 0.61–1.24)
GFR calc Af Amer: 47 mL/min — ABNORMAL LOW (ref >60)
GFR, EST NON AFRICAN AMERICAN: 41 mL/min — AB (ref >60)
Glucose, Bld: 129 mg/dL — ABNORMAL HIGH (ref 65–99)
POTASSIUM: 4 mmol/L (ref 3.5–5.1)
Sodium: 137 mmol/L (ref 135–145)
Total Bilirubin: 1 mg/dL (ref 0.3–1.2)
Total Protein: 6.6 g/dL (ref 6.5–8.1)

## 2016-10-06 LAB — CBC WITH DIFFERENTIAL/PLATELET
Basophils Absolute: 0.1 10*3/uL (ref 0.0–0.1)
Basophils Relative: 1 %
EOS PCT: 1 %
Eosinophils Absolute: 0.1 10*3/uL (ref 0.0–0.7)
HCT: 38.8 % — ABNORMAL LOW (ref 39.0–52.0)
HEMOGLOBIN: 13.1 g/dL (ref 13.0–17.0)
LYMPHS ABS: 1.8 10*3/uL (ref 0.7–4.0)
LYMPHS PCT: 22 %
MCH: 30.5 pg (ref 26.0–34.0)
MCHC: 33.8 g/dL (ref 30.0–36.0)
MCV: 90.2 fL (ref 78.0–100.0)
MONOS PCT: 9 %
Monocytes Absolute: 0.8 10*3/uL (ref 0.1–1.0)
Neutro Abs: 5.6 10*3/uL (ref 1.7–7.7)
Neutrophils Relative %: 67 %
PLATELETS: 174 10*3/uL (ref 150–400)
RBC: 4.3 MIL/uL (ref 4.22–5.81)
RDW: 13.4 % (ref 11.5–15.5)
WBC: 8.4 10*3/uL (ref 4.0–10.5)

## 2016-10-06 LAB — URINALYSIS, ROUTINE W REFLEX MICROSCOPIC
BILIRUBIN URINE: NEGATIVE
GLUCOSE, UA: NEGATIVE mg/dL
HGB URINE DIPSTICK: NEGATIVE
Ketones, ur: NEGATIVE mg/dL
Leukocytes, UA: NEGATIVE
Nitrite: NEGATIVE
PROTEIN: NEGATIVE mg/dL
Specific Gravity, Urine: 1.008 (ref 1.005–1.030)
pH: 8 (ref 5.0–8.0)

## 2016-10-06 MED ORDER — SODIUM CHLORIDE 0.9 % IV BOLUS (SEPSIS)
500.0000 mL | Freq: Once | INTRAVENOUS | Status: AC
  Administered 2016-10-06: 500 mL via INTRAVENOUS

## 2016-10-06 MED ORDER — IOPAMIDOL (ISOVUE-300) INJECTION 61%
30.0000 mL | Freq: Once | INTRAVENOUS | Status: AC | PRN
  Administered 2016-10-06: 30 mL via INTRAVENOUS

## 2016-10-06 MED ORDER — SIMETHICONE 80 MG PO CHEW: 80.0000 mg | CHEWABLE_TABLET | Freq: Four times a day (QID) | ORAL | 0 refills | Status: AC | PRN

## 2016-10-06 MED ORDER — IOPAMIDOL (ISOVUE-300) INJECTION 61%
INTRAVENOUS | Status: AC
  Filled 2016-10-06: qty 30

## 2016-10-06 NOTE — ED Notes (Signed)
Patient transported to X-ray 

## 2016-10-06 NOTE — ED Triage Notes (Signed)
Pt reports chronic constipation yet last BM 09/29/16 despite Miralex and milk magnesia . Last enema 3 days ago. Denis abd pain yet sts uncomfortable , reports nausea. Denies urinary symptoms. Denies back pain.

## 2016-10-06 NOTE — ED Provider Notes (Signed)
Signed out from Dr. Regenia Skeeter pending CT abdomen and pelvis. CT with markedly enlarged prostate and question some urinary retention. Also has nonspecific left renal mass. No evidence of bowel obstruction. Patient is passing gas in the emergency department. Patient has been able to urinate but postvoid residual is elevated. Patient already on Flomax. He is advised to follow-up closely with his urologist, Dr. Tresa Moore and his primary doctor regarding renal mass and likely need for outpatient MRI. Abdominal exam is benign. Return precautions given.   Julianne Rice, MD 10/06/16 [phone]

## 2016-10-06 NOTE — ED Provider Notes (Signed)
[facility] DEPT Provider Note   CSN: [id number] Arrival date & time: 10/06/16  3149     History   Chief Complaint Chief Complaint  Patient presents with  . Constipation    HPI [name] is a 83 y.o. male.  HPI  83 year old male presents with constipation. He states that his last bowel movement was on 8/3. He is to use Muro lax with good success daily. However this stopped working a couple months ago. He switch to another laxative that he takes about once a week or as needed. He tried this one week ago with no relief and then again a couple days ago. He has also tried magnesium citrate without relief. He is wondering if he is impacted. However he does not feel an urgency to go to the bathroom. He is having gas and occasionally has had liquid stool come out. There is no abdominal pain. He feels gas in his abdomen but no significant distention. Over the last couple days he's been eating less although he did eat a hamburger yesterday as well. Some mild nausea but it is not stopping him from eating or drinking. No chest pain, vomiting. Called his PCP who told him he would be best served by coming into the ER for treatment.  Past Medical History:  Diagnosis Date  . 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 Anticoagulant long-term use   . Asthma    until age 59   . Atrial fibrillation (HCC)     on coumadin    . Atrioventricular block, complete ([place])   . BPH (benign prostatic hyperplasia)   . Chronic combined systolic and diastolic CHF (congestive heart failure) (HCC)    EF 40% by ECHO  8/14  - followed by [name]  . Diplopia 08/14/2013  . Encounter for care or replacement of suprapubic tube ([place])   . GERD (gastroesophageal reflux disease)   . Gout   . HOH (hard of hearing)    hearing aids  . Hypertension   . Hyponatremia 2014   HCTZ  . Hypothyroid   . Lumbar disc disease   . Macular degeneration   . Obesity (BMI 30-39.9)   . Orchitis of right testicle   .  Osteoporosis   . Pacemaker-Medtronic 2005   generator change  . Sinusitis     Patient Active Problem List   Diagnosis Date Noted  . 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 Diplopia 08/14/2013  . Benign prostatic hypertrophy with urinary retention 02/10/2013  . Borderline diabetes mellitus 11/08/2012  . Long-term (current) use of anticoagulants 11/07/2012  . Chronic combined systolic and diastolic CHF (congestive heart failure) ([place]) 11/07/2012  . Lumbar disc disease 11/07/2012  . Obesity (BMI 30-39.9) 11/07/2012  . Acute hyponatremia 11/04/2012  . Orchitis of right testicle 11/04/2012  . Scrotal edema 11/04/2012  . BPH with urinary obstruction 11/04/2012  . Atrial fibrillation ([place]) 07/18/2011  . Pacemaker-Medtronic   . Intrinsic asthma, unspecified 11/29/2007  . G E R D 11/14/2007  . Hypertension     Past Surgical History:  Procedure Laterality Date  . ABLATION  1990  . CATARACT EXTRACTION Bilateral   . CYSTOSCOPY N/A 12/11/2012   Procedure: CYSTOSCOPY;  Surgeon: [name], MD;  Location: [facility] ORS;  Service: Urology;  Laterality: N/A;  . [place]  . INSERTION OF SUPRAPUBIC CATHETER N/A 12/11/2012   Procedure: INSERTION OF SUPRAPUBIC CATHETER " NEEDS PERC BALLOON LIKE FOR PERC KIDNEY";  Surgeon: [name], MD;  Location: [facility] ORS;  Service: Urology;  Laterality: N/A;  . PACEMAKER GENERATOR CHANGE N/A 07/19/2011   Procedure: PACEMAKER GENERATOR CHANGE;  Surgeon: [name], MD;  Location: [facility] CATH LAB;  Service: Cardiovascular;  Laterality: N/A;  . PACEMAKER INSERTION    . ROBOT ASSISTED LAPAROSCOPIC RADICAL PROSTATECTOMY N/A 02/10/2013   Procedure: ROBOTIC ASSISTED LAPAROSCOPIC SIMPLE PROSTATECTOMY;  Surgeon: [name], MD;  Location: [facility] ORS;  Service: Urology;  Laterality: N/A;  . SUPRPUBIC TUBE    . TRANSURETHRAL RESECTION OF PROSTATE         Home Medications    Prior to Admission medications   Medication Sig Start Date End Date Taking?  Authorizing Provider  beta carotene w/minerals (OCUVITE) tablet Take 1 tablet by mouth at bedtime.   Yes [provider]  bisoprolol (ZEBETA) 10 MG tablet Take 10 mg by mouth 2 (two) times daily. 08/28/16  Yes [provider]  budesonide-formoterol (SYMBICORT) 160-4.5 MCG/ACT inhaler Inhale 2 puffs into the lungs 2 (two) times daily.   Yes [provider]  Calcium Carbonate-Vitamin D (CALCIUM 600 + D PO) Take 1 tablet by mouth daily.   Yes [provider]  cholecalciferol (VITAMIN D) 1000 UNITS tablet Take 2,000 Units by mouth daily.   Yes [provider]  finasteride (PROSCAR) 5 MG tablet Take 5 mg by mouth daily.    Yes [provider]  furosemide (LASIX) 40 MG tablet Take 40 mg by mouth daily.  03/13/15  Yes [provider]  hydrALAZINE (APRESOLINE) 50 MG tablet Take 50 mg by mouth 2 (two) times daily.   Yes [provider]  irbesartan (AVAPRO) 300 MG tablet Take 300 mg by mouth daily. 10/02/16  Yes [provider]  levothyroxine (SYNTHROID, LEVOTHROID) 75 MCG tablet Take 75 mcg by mouth daily before breakfast.   Yes [provider]  Multiple Vitamin (MULTIVITAMIN WITH MINERALS) TABS tablet Take 1 tablet by mouth daily.   Yes [provider]  omega-3 acid ethyl esters (LOVAZA) 1 G capsule Take 1 g by mouth every morning.   Yes [provider]  omeprazole (PRILOSEC) 20 MG capsule Take 20 mg by mouth at bedtime.    Yes [provider]  temazepam (RESTORIL) 15 MG capsule Take 15 mg by mouth daily. 10/02/16  Yes [provider]  traMADol (ULTRAM) 50 MG tablet Take 1 tablet (50 mg total) by mouth every 6 (six) hours as needed for moderate pain. Post-operatively. 02/14/13  Yes [name], MD  warfarin (COUMADIN) 3 MG tablet Take 1.5-3 mg by mouth daily. Takes 0.5 tablet (1.5 mg) on Wednesday and Saturday and Take 1 tablet (3 mg) on all other days.    [provider]     Family History Family History  Problem Relation Age of Onset  . Heart disease Mother   . Rheum arthritis Mother   . Deep vein thrombosis Sister     Social History Social History  Substance Use Topics  . Smoking status: Former Smoker    Packs/day: 1.00    Years: 2.00    Types: Cigarettes    Quit date: 07/17/1941  . Smokeless tobacco: Never Used  . Alcohol use No     Allergies   Hctz [hydrochlorothiazide]   Review of Systems Review of Systems  Cardiovascular: Negative for chest pain.  Gastrointestinal: Positive for constipation and nausea. Negative for abdominal distention, abdominal pain and vomiting.  All other systems reviewed and are negative.    Physical Exam Updated Vital  Signs BP (!) 164/67   Pulse 62   Temp 98.2 F (36.8 C)   Resp 15   SpO2 92%   Physical Exam  Constitutional: He is oriented to person, place, and time. He appears well-developed and well-nourished.  HENT:  Head: Normocephalic and atraumatic.  Right Ear: External ear normal.  Left Ear: External ear normal.  Nose: Nose normal.  Eyes: Right eye exhibits no discharge. Left eye exhibits no discharge.  Neck: Neck supple.  Cardiovascular: Normal rate, regular rhythm and normal heart sounds.   Pulmonary/Chest: Effort normal and breath sounds normal.  Abdominal: Soft. He exhibits no distension and no mass. There is no tenderness. There is no guarding. A hernia (abd wall, large, reducible) is present.  Genitourinary: Rectal exam shows no external hemorrhoid, no internal hemorrhoid, no mass and no tenderness.  Genitourinary Comments: Very minimal liquid stool on gloved finger during exam. However no solid stool or impaction. No tenderness. No gross blood.  Musculoskeletal: He exhibits no edema.  Neurological: He is alert and oriented to person, place, and time.  Skin: Skin is warm and dry.  Nursing note and vitals reviewed.    ED Treatments / Results  Labs (all labs ordered are listed,  but only abnormal results are displayed) Labs Reviewed  COMPREHENSIVE METABOLIC PANEL  CBC WITH DIFFERENTIAL/PLATELET    EKG  EKG Interpretation None       Radiology No results found.  Procedures Procedures (including critical care time)  Medications Ordered in ED Medications - No data to display   Initial Impression / Assessment and Plan / ED Course  I have reviewed the triage vital signs and the nursing notes.  Pertinent labs & imaging results that were available during my care of the patient were reviewed by me and considered in my medical decision making (see chart for details).     Patient's rectal exam shows no significant stool or impaction. Well appearing. Given Xray findings, will get CT to r/o obstruction or significant abnormality with his nausea. Care transferred to Dr. Lita Mains with CT pending.  Final Clinical Impressions(s) / ED Diagnoses   Final diagnoses:  None    New Prescriptions New Prescriptions   No medications on file     Sherwood Gambler, MD 10/06/16 1642

## 2016-11-14 ENCOUNTER — Ambulatory Visit
Admission: RE | Admit: 2016-11-14 | Discharge: 2016-11-14 | Disposition: A | Discharge status: Home / Self Care | Payer: Medicare Other | Source: Ambulatory Visit | Attending: Cardiology | Admitting: Cardiology

## 2016-11-14 ENCOUNTER — Other Ambulatory Visit: Payer: Self-pay | Admitting: Cardiology

## 2016-11-14 DIAGNOSIS — R0602 Shortness of breath: Secondary | ICD-10-CM

## 2017-02-16 ENCOUNTER — Ambulatory Visit
Admission: RE | Admit: 2017-02-16 | Discharge: 2017-02-16 | Disposition: A | Discharge status: Home / Self Care | Payer: Medicare Other | Source: Ambulatory Visit | Attending: Cardiology | Admitting: Cardiology

## 2017-02-16 ENCOUNTER — Other Ambulatory Visit: Payer: Self-pay | Admitting: Cardiology

## 2017-02-16 DIAGNOSIS — R0602 Shortness of breath: Secondary | ICD-10-CM

## 2017-03-10 ENCOUNTER — Other Ambulatory Visit: Payer: Self-pay | Admitting: Internal Medicine

## 2017-03-10 DIAGNOSIS — J329 Chronic sinusitis, unspecified: Secondary | ICD-10-CM

## 2017-03-13 ENCOUNTER — Other Ambulatory Visit: Payer: Medicare Other

## 2017-03-13 ENCOUNTER — Ambulatory Visit
Admission: RE | Admit: 2017-03-13 | Discharge: 2017-03-13 | Disposition: A | Discharge status: Home / Self Care | Payer: Medicare Other | Source: Ambulatory Visit | Attending: Internal Medicine | Admitting: Internal Medicine

## 2017-03-13 DIAGNOSIS — J329 Chronic sinusitis, unspecified: Secondary | ICD-10-CM

## 2017-11-13 ENCOUNTER — Other Ambulatory Visit: Payer: Self-pay | Admitting: Cardiology

## 2017-11-13 ENCOUNTER — Ambulatory Visit
Admission: RE | Admit: 2017-11-13 | Discharge: 2017-11-13 | Disposition: A | Discharge status: Home / Self Care | Payer: Medicare Other | Source: Ambulatory Visit | Attending: Cardiology | Admitting: Cardiology

## 2017-11-13 DIAGNOSIS — R0602 Shortness of breath: Secondary | ICD-10-CM

## 2017-11-15 ENCOUNTER — Encounter: Payer: Self-pay | Admitting: Cardiology

## 2017-11-20 ENCOUNTER — Encounter: Payer: Self-pay | Admitting: Cardiology

## 2017-11-20 DIAGNOSIS — I5032 Chronic diastolic (congestive) heart failure: Secondary | ICD-10-CM

## 2017-11-20 NOTE — Progress Notes (Signed)
[name]  Date of visit:  11/20/2017 DOB:  04-24-1922    Age:  84 yrs. Medical record number:  [id number]     Account number:  [id number] Primary Care Provider: Lutricia Feil ____________________________ CURRENT DIAGNOSES  1. Chronic diastolic heart failure  2. Dyspnea  3. Paroxysmal atrial fibrillation  4. Hypertensive heart disease without heart failure  5. CAD Native without angina  6. Mitral valve regurgitation  7. Obesity  8. Hyperlipidemia  9. Long term (current) use of anticoagulants  10. Presence of cardiac pacemaker  11. Atherosclerosis of aorta  12. Supraventricular tachycardia ____________________________ ALLERGIES  Sulfa (Sulfonamides), Intolerance-unknown ____________________________ MEDICATIONS  1. albuterol sulfate HFA 90 mcg/actuation aerosol inhaler, PRN  2. alendronate 70 mg Tablet, weekly  3. bisoprolol fumarate 10 mg tablet, BID  4. Calcium 500 + D 500 mg(1,250mg ) -400 unit Tablet, Chewable, 1 p.o. daily  5. cetirizine 10 mg tablet, 1 p.o. daily  6. Colcrys 0.6 mg Tablet, PRN  7. finasteride 5 mg tablet, 1 p.o. daily  8. Fish Oil 1,000 mg Capsule, 1 p.o. daily  9. flurazepam 30 mg capsule, hs prn  10. furosemide 80 mg tablet, BID  11. hydralazine 50 mg tablet, BID  12. irbesartan 300 mg tablet, 1 p.o. daily  13. levalbuterol HFA 45 mcg/actuation aerosol inhaler, PRN  14. levothyroxine 75 mcg Tablet, 1 p.o. daily  15. Miralax 17 gram oral powder packet, PRN  16. multivitamin tablet, 1 p.o. q.d.  17. Ocuvite Tablet, 1 p.o. daily  18. Prilosec OTC 20 mg tablet,delayed release (DR/EC), 1 p.o. daily  19. Probiotic 10 billion cell capsule, 1 p.o. daily  20. Symbicort 160 mcg-4.5 mcg/actuation HFA aerosol inhaler, 2 puffs b.i.d.  21. tramadol 50 mg tablet, PRN  22. Vitamin D 2,000 unit Capsule, 1 p.o. daily  23. warfarin 3 mg tablet, Take as directed ____________________________ HISTORY OF PRESENT ILLNESS  Patient returns for cardiac followup. He  continues to have significant dyspnea. He has increased furosemide to 80 mg twice daily but is still having some PND and orthopnea and called yesterday looking for oxygen to have at night. His wife is on hospice care and is not expected to live much longer said this has been stressful for him. According to scales has gained weight since he was here. He has a history of atrial fibrillation. Last lab work shows a BNP level of 711. ____________________________ PAST HISTORY  Past Medical Illnesses:  hypertension, obesity, BPH, history of asthma, lumbar disc disease, gout, hypothyroidism, GERD;  Cardiovascular Illnesses:  arrhythmia-SVT, history of AV ablation for SVT at [facility], Pacemaker for second degree AV block 4/05 in [place], atrial fibrillation, CAD with coronary calcifications on CT;  Surgical Procedures:  inguinal herniorrhaphy-rt, inguinal herniorrhaphy-left, prostate surg, Medtronic pacemaker implant March 2005;  NYHA Classification:  II;  Canadian Angina Classification:  Class 0: Asymptomatic;  Cardiology Procedures-Invasive:  cardiac cath (left) December 1989, RF ablation for SVT May 1995, Medtronic generator change May 2013;  Cardiology Procedures-Noninvasive:  echocardiogram January 2010, treadmill cardiolite July 2014, echocardiogram August 2014, echocardiogram August 2017, echocardiogram September 2018;  Cardiac Cath Results:  no significant disease;  LVEF of 55% documented via echocardiogram on 11/20/2016,  CHADS Score:  2,  CHA2DS2-VASC Score:  3 ____________________________ CARDIO-PULMONARY TEST DATES EKG Date:  11/13/2017;   Cardiac Cath Date:  02/01/1988;  Holter/Event Monitor Date: 12/02/2007;  Nuclear Study Date:  09/05/2012;  Echocardiography Date: 11/20/2016;  Chest Xray Date: 02/16/2017;   ____________________________ FAMILY HISTORY Father -- Father dead,  Unknown Disease, Deceased Mother -- Mother dead, Congestive heart failure, Deceased Sister -- Sister  dead ____________________________ SOCIAL HISTORY Alcohol Use:  no alcohol use;  Smoking:  never smoked;  Diet:  regular diet;  Lifestyle:  widower and remarried;  Exercise:  some exercise;  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[employer];  Residence:  lives with wife;   ____________________________ REVIEW OF SYSTEMS General:  weight gain of approximately 10 lbs Ears, Nose, Throat, Mouth:  partial hearing loss, seasonal sinusitis Respiratory: mild dyspnea with exertion Cardiovascular:  please review HPI  Genitourinary-Male: nocturia, urgency  Musculoskeletal:  chronic low back pain Neurological:  denies headaches, stroke, or TIA Psychiatric:  situational stress  ____________________________ PHYSICAL EXAMINATION VITAL SIGNS  Blood Pressure:  140/80 Sitting, Right arm, regular cuff  , 144/72 Standing, Right arm and regular cuff   Pulse:  74/min. Weight:  224.00 lbs. Height:  70"BMI: 32  Constitutional:  pleasant white male in no acute distress, mildly obese Skin:  scattered sebaceous cysts, occasional hemangiomas Head:  normocephalic, balding male hair pattern ENT:  bilateral hearing aides present Neck:  supple, no masses, thyromegaly, JVD. Carotid pulses are full and equal bilaterally without bruits. Chest:  healed pacemaker incision in the left pectoral area, clear to auscultation Cardiac:  regular rhythm, normal S1 and S2, no S3 or S4, no murmur Peripheral Pulses:  the femoral,dorsalis pedis, and posterior tibial pulses are full and equal bilaterally with no bruits auscultated. Extremities & Back:  bilateral venous insufficiency changes present, 2+ edema present Psychiatric:  oriented to time, place, and person Neurological:  no gross motor or sensory deficits noted, affect appropriate, oriented x3. ____________________________ MOST RECENT LIPID PANEL 03/01/17  CHOL TOTL 194 mg/dl, LDL 122 NM, HDL 44 mg/dl, TRIGLYCER 138 mg/dl and CHOL/HDL 4.4  (Calc) ____________________________ IMPRESSIONS/PLAN  1. Chronic diastolic heart failure with severe LVH and previous restrictive blood flow 2. Prior moderate mitral and tricuspid regurgitation with moderate pulmonary hypertension 3. Long-term use of anticoagulation without complication 4. History of atrial fibrillation 5. Functioning pacemaker  Recommendations:  We asked home health care to go out and get oximetry at night to determine if he would be a candidate for oxygen. In the meantime increase Lasix to 160 mg in the morning and 80 mg in the afternoon for 2 days. Continue to weight. Followup with  heart care within 2 weeks to determine how he is doing. I told him today that I would be out on extended medical leave beginning Thursday. Lat ECHO one year ago but may be worthwhile repeating.    ____________________________ Cardiology Physician:  Kerry Hough MD [facility]

## 2017-11-20 NOTE — Progress Notes (Signed)
[name] E  Date of visit:  11/13/2017 DOB:  06-10-1922    Age:  84 yrs. Medical record number:  [id number]     Account number:  [id number] Primary Care Provider: Lutricia Feil ____________________________ CURRENT DIAGNOSES  1. Chronic diastolic heart failure  2. Dyspnea  3. Paroxysmal atrial fibrillation  4. Hypertensive heart disease without heart failure  5. CAD Native without angina  6. Mitral valve regurgitation  7. Obesity  8. Hyperlipidemia  9. Long term (current) use of anticoagulants  10. Presence of cardiac pacemaker  11. Atherosclerosis of aorta  12. Supraventricular tachycardia ____________________________ ALLERGIES  Sulfa (Sulfonamides), Intolerance-unknown ____________________________ MEDICATIONS  1. albuterol sulfate HFA 90 mcg/actuation aerosol inhaler, PRN  2. alendronate 70 mg Tablet, weekly  3. bisoprolol fumarate 10 mg tablet, BID  4. Calcium 500 + D 500 mg(1,250mg ) -400 unit Tablet, Chewable, 1 p.o. daily  5. cetirizine 10 mg tablet, 1 p.o. daily  6. Colcrys 0.6 mg Tablet, PRN  7. finasteride 5 mg tablet, 1 p.o. daily  8. Fish Oil 1,000 mg Capsule, 1 p.o. daily  9. flurazepam 30 mg capsule, hs prn  10. furosemide 80 mg tablet, 80 mg in the am and 40 mg at supper  11. hydralazine 50 mg tablet, BID  12. irbesartan 300 mg tablet, 1 p.o. daily  13. levalbuterol HFA 45 mcg/actuation aerosol inhaler, PRN  14. levothyroxine 75 mcg Tablet, 1 p.o. daily  15. Miralax 17 gram oral powder packet, PRN  16. multivitamin tablet, 1 p.o. q.d.  17. Ocuvite Tablet, 1 p.o. daily  18. Prilosec OTC 20 mg tablet,delayed release (DR/EC), 1 p.o. daily  19. Probiotic 10 billion cell capsule, 1 p.o. daily  20. Symbicort 160 mcg-4.5 mcg/actuation HFA aerosol inhaler, 2 puffs b.i.d.  21. tramadol 50 mg tablet, PRN  22. Vitamin D 2,000 unit Capsule, 1 p.o. daily  23. warfarin 3 mg tablet, Take as directed ____________________________ CHIEF COMPLAINTS  shortness of  breath ____________________________ HISTORY OF PRESENT ILLNESS  The patient returns early for evaluation of dyspnea. He developed some blood in his urine and went to see the urologist and has been told that he has a urinary tract infection and is currently being treated for this. He has had worsening shortness of breath despite keeping his Lasix the same. His weight is down. He doesn't have chest pain but is limited and is fatigued. He reports that his wife got a bad report recently and does not have long to live. He denies PND, orthopnea or palpitations.  ____________________________ PAST HISTORY  Past Medical Illnesses:  hypertension, obesity, BPH, history of asthma, lumbar disc disease, gout, hypothyroidism, GERD;  Cardiovascular Illnesses:  arrhythmia-SVT, history of AV ablation for SVT at [facility], Pacemaker for second degree AV block 4/05 in [place], atrial fibrillation, CAD with coronary calcifications on CT;  Surgical Procedures:  inguinal herniorrhaphy-rt, inguinal herniorrhaphy-left, prostate surg, Medtronic pacemaker implant March 2005;  NYHA Classification:  II;  Canadian Angina Classification:  Class 0: Asymptomatic;  Cardiology Procedures-Invasive:  cardiac cath (left) December 1989, RF ablation for SVT May 1995, Medtronic generator change May 2013;  Cardiology Procedures-Noninvasive:  echocardiogram January 2010, treadmill cardiolite July 2014, echocardiogram August 2014, echocardiogram August 2017, echocardiogram September 2018;  Cardiac Cath Results:  no significant disease;  LVEF of 55% documented via echocardiogram on 11/20/2016,  CHADS Score:  2,  CHA2DS2-VASC Score:  3 ____________________________ CARDIO-PULMONARY TEST DATES EKG Date:  11/13/2017;   Cardiac Cath Date:  02/01/1988;  Holter/Event Monitor Date: 12/02/2007;  Nuclear  Study Date:  09/05/2012;  Echocardiography Date: 11/20/2016;  Chest Xray Date: 02/16/2017;   ____________________________ SOCIAL HISTORY Alcohol Use:  no  alcohol use;  Smoking:  never smoked;  Diet:  regular diet;  Lifestyle:  widower and remarried;  Exercise:  some exercise;  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[employer];  Residence:  lives with wife;   ____________________________ REVIEW OF SYSTEMS General:  weight loss of approximately 15 lbs Ears, Nose, Throat, Mouth:  partial hearing loss, seasonal sinusitis Respiratory: mild dyspnea with exertion Cardiovascular:  please review HPI  Genitourinary-Male: nocturia, urgency  Musculoskeletal:  chronic low back pain Neurological:  denies headaches, stroke, or TIA Psychiatric:  situational stress  ____________________________ PHYSICAL EXAMINATION VITAL SIGNS  Blood Pressure:  120/52 Sitting, Left arm, large cuff  , 120/70 Standing, Left arm and large cuff   Pulse:  76/min. Weight:  212.00 lbs. Height:  70"BMI: 30  Constitutional:  pleasant white male in no acute distress, mildly obese Skin:  scattered sebaceous cysts, occasional hemangiomas Head:  normocephalic, balding male hair pattern ENT:  bilateral hearing aides present Neck:  supple, no masses, thyromegaly, JVD. Carotid pulses are full and equal bilaterally without bruits. Chest:  healed pacemaker incision in the left pectoral area, clear to auscultation Cardiac:  regular rhythm, normal S1 and S2, no S3 or S4, no murmur Peripheral Pulses:  the femoral,dorsalis pedis, and posterior tibial pulses are full and equal bilaterally with no bruits auscultated. Extremities & Back:  bilateral venous insufficiency changes present, 1+ edema present Psychiatric:  oriented to time, place, and person Neurological:  no gross motor or sensory deficits noted, affect appropriate, oriented x3. ____________________________ MOST RECENT LIPID PANEL 03/01/17  CHOL TOTL 194 mg/dl, LDL 122 NM, HDL 44 mg/dl, TRIGLYCER 138 mg/dl and CHOL/HDL 4.4 (Calc) ____________________________ IMPRESSIONS/PLAN  1. Dyspnea with mild edema possible exacerbation of  diastolic heart failure 2. Long-term use of anticoagulation 3. Hypertensive heart disease 4. Functioning permanent pacemaker  Recommendations:  I explained dyspnea. Recommended lab as noted below including BMP, CBC, chemistry panel and TSH. Troponin EKG shows sinus rhythm with paced ventricular beats. I would like him to get a chest x-ray. He may need to increase his furosemide to her like to see what is renal function is impaired as well as his x-ray and BNP levels since his weight is down. ____________________________ TODAYS ORDERS  1. Comprehensive Metabolic Panel: Today  2. Complete Blood Count: Today  3. BNP: Today  4. TSH: Today  5. 12 Lead EKG: Today  6. Chest X-ray PA/Lat: today  7. Return Visit: keep appt                       ____________________________ Cardiology Physician:  Kerry Hough MD [facility]

## 2017-11-21 ENCOUNTER — Telehealth: Payer: Self-pay | Admitting: *Deleted

## 2017-11-21 NOTE — Telephone Encounter (Signed)
S/w pt's wife and stated to call back later pt is still sleeping.

## 2017-11-21 NOTE — Telephone Encounter (Signed)
S/w pt is aware appt has been scheduled with Truitt Merle, NP next week. Pt was given location for this office.    Pt's lasix has been doubled for two days due to SOB and LV dysfuntion.  Pt's weight has been maintaining.   S/w Brandy at [name] office @ [phone] and will fax over records, ekg, cxr and labs from the last two weeks.

## 2017-11-21 NOTE — Telephone Encounter (Signed)
-----   Message from Burtis Junes, NP sent at 11/21/2017  7:26 AM EDT ----- Regarding: FW: Followup I will take care of it.   Cecille Rubin ----- Message ----- From: Jacolyn Reedy, MD Sent: 11/20/2017   6:06 PM EDT To: Burtis Junes, NP Subject: Followup                                       Hey [name] is a long time patient with a wife dying on Hospice who has been having daistolic HF. He is 89   I bumped up his Lasix today and could you or someone see himn in 1-2 weeks?  Thanks,

## 2017-11-26 ENCOUNTER — Telehealth: Payer: Self-pay | Admitting: *Deleted

## 2017-11-26 ENCOUNTER — Ambulatory Visit: Payer: Medicare Other | Admitting: Nurse Practitioner

## 2017-11-26 NOTE — Telephone Encounter (Signed)
S/w pt cancelled appt today due to spouse not doing so well, she is in hospice.  Pt stated will call office and ask for Eric Merle, NP when pt is ready to come in.  Expressed thoughts and prayers and pt was appreciative. Will send to [place] to [place].

## 2017-11-26 NOTE — Progress Notes (Deleted)
CARDIOLOGY OFFICE NOTE  Date:  11/26/2017    [name] Date of Birth: [birth year] Medical Record #[id number]  PCP:  [name], MD  Cardiologist:  [name] & ***    No chief complaint on file.   History of Present Illness: [name] is a 84 y.o. male who presents today for a ***   Comes in today. Here with   Past Medical History:  Diagnosis Date  . 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 Anticoagulant long-term use   . Asthma    until age 56   . Atrial fibrillation (HCC)     on coumadin    . Atrioventricular block, complete ([place])   . BPH (benign prostatic hyperplasia)   . Chronic combined systolic and diastolic CHF (congestive heart failure) (HCC)    EF 40% by ECHO  8/14  - followed by [name]  . Diplopia 08/14/2013  . Encounter for care or replacement of suprapubic tube ([place])   . GERD (gastroesophageal reflux disease)   . Gout   . HOH (hard of hearing)    hearing aids  . Hypertension   . Hyponatremia 2014   HCTZ  . Hypothyroid   . Lumbar disc disease   . Macular degeneration   . Obesity (BMI 30-39.9)   . Orchitis of right testicle   . Osteoporosis   . Pacemaker-Medtronic 2005   generator change  . Sinusitis     Past Surgical History:  Procedure Laterality Date  . ABLATION  1990  . CATARACT EXTRACTION Bilateral   . CYSTOSCOPY N/A 12/11/2012   Procedure: CYSTOSCOPY;  Surgeon: [name], MD;  Location: [facility];  Service: Urology;  Laterality: N/A;  . [place]  . INSERTION OF SUPRAPUBIC CATHETER N/A 12/11/2012   Procedure: INSERTION OF SUPRAPUBIC CATHETER " NEEDS PERC BALLOON LIKE FOR PERC KIDNEY";  Surgeon: [name], MD;  Location: [facility];  Service: Urology;  Laterality: N/A;  . PACEMAKER GENERATOR CHANGE N/A 07/19/2011   Procedure: PACEMAKER GENERATOR CHANGE;  Surgeon: [name], MD;  Location: [facility] CATH LAB;  Service: Cardiovascular;  Laterality: N/A;  . PACEMAKER INSERTION    . ROBOT ASSISTED  LAPAROSCOPIC RADICAL PROSTATECTOMY N/A 02/10/2013   Procedure: ROBOTIC ASSISTED LAPAROSCOPIC SIMPLE PROSTATECTOMY;  Surgeon: [name], MD;  Location: [facility];  Service: Urology;  Laterality: N/A;  . SUPRPUBIC TUBE    . TRANSURETHRAL RESECTION OF PROSTATE       Medications: No outpatient medications have been marked as taking for the 11/26/17 encounter (Appointment) with Burtis Junes, NP.     Allergies: Allergies  Allergen Reactions  . Hctz [Hydrochlorothiazide]     Hyponatremia     Social History: The patient  reports that he quit smoking about 76 years ago. His smoking use included cigarettes. He has a 2.00 pack-year smoking history. He has never used smokeless tobacco. He reports that he does not drink alcohol or use drugs.   Family History: The patient's ***family history includes Deep vein thrombosis in his sister; Heart disease in his mother; Rheum arthritis in his mother.   Review of Systems: Please see the history of present illness.   Otherwise, the review of systems is positive for {NONE DEFAULTED:18576::"none"}.   All other systems are reviewed and negative.   Physical Exam: VS:  There were no vitals taken for this visit. [name]  BMI There is no height or weight on file to calculate BMI.  Wt Readings  from Last 3 Encounters:  08/14/13 203 lb (92.1 kg)  02/11/13 206 lb 9.1 oz (93.7 kg)  02/04/13 203 lb 8 oz (92.3 kg)    General: Pleasant. Well developed, well nourished and in no acute distress.   HEENT: Normal.  Neck: Supple, no JVD, carotid bruits, or masses noted.  Cardiac: ***Regular rate and rhythm. No murmurs, rubs, or gallops. No edema.  Respiratory:  Lungs are clear to auscultation bilaterally with normal work of breathing.  GI: Soft and nontender.  MS: No deformity or atrophy. Gait and ROM intact.  Skin: Warm and dry. Color is normal.  Neuro:  Strength and sensation are intact and no gross focal deficits noted.  Psych: Alert, appropriate and with  normal affect.   LABORATORY DATA:  EKG:  EKG {ACTION; IS/IS ZOX:[id number]} ordered today. This demonstrates ***.  Lab Results  Component Value Date   WBC 8.4 10/06/2016   HGB 13.1 10/06/2016   HCT 38.8 (L) 10/06/2016   PLT 174 10/06/2016   GLUCOSE 129 (H) 10/06/2016   ALT 13 (L) 10/06/2016   AST 18 10/06/2016   NA 137 10/06/2016   K 4.0 10/06/2016   CL 101 10/06/2016   CREATININE 1.44 (H) 10/06/2016   BUN 22 (H) 10/06/2016   CO2 28 10/06/2016   TSH 2.970 08/15/2013   INR 1.87 10/06/2016   HGBA1C 6.6 (H) 11/07/2012     BNP (last 3 results) No results for input(s): BNP in the last 8760 hours.  ProBNP (last 3 results) No results for input(s): PROBNP in the last 8760 hours.   Other Studies Reviewed Today:   Assessment/Plan:   Current medicines are reviewed with the patient today.  The patient does not have concerns regarding medicines other than what has been noted above.  The following changes have been made:  See above.  Labs/ tests ordered today include:   No orders of the defined types were placed in this encounter.    Disposition:   FU with *** in {gen number 9-81:191478} {Days to years:10300}.   Patient is agreeable to this plan and will call if any problems develop in the interim.   SignedTruitt Merle, NP  11/26/2017 7:36 AM  [facility] [address] [place], [place]  [address] Phone: [phone] Fax: [phone]

## 2017-12-04 ENCOUNTER — Ambulatory Visit: Payer: Medicare Other | Admitting: *Deleted

## 2017-12-04 ENCOUNTER — Telehealth: Payer: Self-pay | Admitting: Cardiology

## 2017-12-04 NOTE — Telephone Encounter (Signed)
Confirmed remote transmission w/ pt daughter.   

## 2017-12-05 ENCOUNTER — Ambulatory Visit (INDEPENDENT_AMBULATORY_CARE_PROVIDER_SITE_OTHER): Payer: Medicare Other | Admitting: *Deleted

## 2017-12-05 DIAGNOSIS — I5032 Chronic diastolic (congestive) heart failure: Secondary | ICD-10-CM | POA: Diagnosis not present

## 2017-12-11 ENCOUNTER — Encounter: Payer: Self-pay | Admitting: Nurse Practitioner

## 2017-12-11 ENCOUNTER — Ambulatory Visit: Payer: Medicare Other | Admitting: Nurse Practitioner

## 2017-12-11 VITALS — BP 120/50 | HR 60 | Ht 71.0 in | Wt 222.8 lb

## 2017-12-11 DIAGNOSIS — Z79899 Other long term (current) drug therapy: Secondary | ICD-10-CM

## 2017-12-11 DIAGNOSIS — I5032 Chronic diastolic (congestive) heart failure: Secondary | ICD-10-CM

## 2017-12-11 DIAGNOSIS — Z950 Presence of cardiac pacemaker: Secondary | ICD-10-CM

## 2017-12-11 DIAGNOSIS — I480 Paroxysmal atrial fibrillation: Secondary | ICD-10-CM | POA: Diagnosis not present

## 2017-12-11 NOTE — Progress Notes (Signed)
CARDIOLOGY OFFICE NOTE  Date:  12/11/2017    [name] Date of Birth: 09-24-1922 Medical Record #[id number]  PCP:  [name], MD  Cardiologist:  Rozetta Nunnery    Chief Complaint  Patient presents with  . Congestive Heart Failure    Follow up visit - seen for [name]    History of Present Illness: [name] is a 84 y.o. male who presents today for a work in/follow up visit. Seen for [name].   He has a history of chronic diastolic HF, PAF, HTN, CAD with coronary calcifications on prior CT and prior cath in 2018 showing no significant disease, MV regurgitation, obesity, HLD, chronic anticoagulation, underlying PPM (for second degree AV block in 2005 in [place]) and prior SVT with prior ablation in 1988 at [place]. Other issues include obesity, BPH, asthma, gout, hypothyroidism, & GERD.   Noted EF of 55% by echo from 10/2016.   He was seen last month by [name]. He has had progressive shortness of breath despite escalating doses of diuretics. He has been caring for his wife who is on Hospice.   Comes in today. Here with his caregiver today. His wife passed last week. While he admits he is grieving, he is happy that she is no longer suffering. He feels better on the 80 mg of lasix BID. Less shortness of He takes his second dose - along with his other nighttime medicines - right at bedtime (7:30PM). He is then up 4 to 5 times a night to void. No chest pain. Some fatigue. Little achy all over. No fever. Has had lots of guests/visitors with the recent funeral. No real swelling in his feet. He does like salt - likes ham. He feels that overall he is doing ok. His coumadin is monitored by his PCP. No excessive bleeding or bruising. He has done a remote PPM check that came to our office. He does sit most of the day - not very active.   Past Medical History:  Diagnosis Date  . 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 Anticoagulant long-term use   . Asthma    until age 29   . Atrial fibrillation (HCC)     on coumadin    . Atrioventricular block, complete ([place])   . BPH (benign prostatic hyperplasia)   . Chronic combined systolic and diastolic CHF (congestive heart failure) (HCC)    EF 40% by ECHO  8/14  - followed by [name]  . Diplopia 08/14/2013  . Encounter for care or replacement of suprapubic tube ([place])   . GERD (gastroesophageal reflux disease)   . Gout   . HOH (hard of hearing)    hearing aids  . Hypertension   . Hyponatremia 2014   HCTZ  . Hypothyroid   . Lumbar disc disease   . Macular degeneration   . Obesity (BMI 30-39.9)   . Orchitis of right testicle   . Osteoporosis   . Pacemaker-Medtronic 2005   generator change  . Sinusitis     Past Surgical History:  Procedure Laterality Date  . ABLATION  1990  . CATARACT EXTRACTION Bilateral   . CYSTOSCOPY N/A 12/11/2012   Procedure: CYSTOSCOPY;  Surgeon: [name], MD;  [facility];  Service: Urology;  Laterality: N/A;  . [place]  . INSERTION OF SUPRAPUBIC CATHETER N/A 12/11/2012   Procedure: INSERTION OF SUPRAPUBIC CATHETER " NEEDS PERC BALLOON LIKE FOR PERC KIDNEY";  Surgeon: [name], MD;  [facility];  Service: Urology;  Laterality: N/A;  . PACEMAKER GENERATOR CHANGE N/A 07/19/2011   Procedure: PACEMAKER GENERATOR CHANGE;  Surgeon: [name], MD;  Location: [facility] CATH LAB;  Service: Cardiovascular;  Laterality: N/A;  . PACEMAKER INSERTION    . ROBOT ASSISTED LAPAROSCOPIC RADICAL PROSTATECTOMY N/A 02/10/2013   Procedure: ROBOTIC ASSISTED LAPAROSCOPIC SIMPLE PROSTATECTOMY;  Surgeon: [name], MD;  [facility];  Service: Urology;  Laterality: N/A;  . SUPRPUBIC TUBE    . TRANSURETHRAL RESECTION OF PROSTATE       Medications: Current Meds  Medication Sig  . beta carotene w/minerals (OCUVITE) tablet Take 1 tablet by mouth at bedtime.  . bisoprolol (ZEBETA) 10 MG tablet Take 10 mg by mouth 2 (two) times daily.  .  budesonide-formoterol (SYMBICORT) 160-4.5 MCG/ACT inhaler Inhale 2 puffs into the lungs 2 (two) times daily.  . Calcium Carbonate-Vitamin D (CALCIUM 600 + D PO) Take 1 tablet by mouth daily.  . cholecalciferol (VITAMIN D) 1000 UNITS tablet Take 2,000 Units by mouth daily.  . finasteride (PROSCAR) 5 MG tablet Take 5 mg by mouth daily.   . furosemide (LASIX) 80 MG tablet Take 80 mg by mouth 2 (two) times daily.   . hydrALAZINE (APRESOLINE) 50 MG tablet Take 50 mg by mouth 2 (two) times daily.  . irbesartan (AVAPRO) 300 MG tablet Take 300 mg by mouth daily.  [name] levothyroxine (SYNTHROID, LEVOTHROID) 75 MCG tablet Take 75 mcg by mouth daily before breakfast.  . Multiple Vitamin (MULTIVITAMIN WITH MINERALS) TABS tablet Take 1 tablet by mouth daily.  [name] omega-3 acid ethyl esters (LOVAZA) 1 G capsule Take 1 g by mouth every morning.  [name] omeprazole (PRILOSEC) 20 MG capsule Take 20 mg by mouth at bedtime.   . simethicone (GAS-X) 80 MG chewable tablet Chew 1 tablet (80 mg total) by mouth every 6 (six) hours as needed (bloating).  . temazepam (RESTORIL) 15 MG capsule Take 15 mg by mouth daily.  . traMADol (ULTRAM) 50 MG tablet Take 1 tablet (50 mg total) by mouth every 6 (six) hours as needed for moderate pain. Post-operatively.  . warfarin (COUMADIN) 3 MG tablet Take 1.5-3 mg by mouth daily. Takes 0.5 tablet (1.5 mg) on Wednesday and Saturday and Take 1 tablet (3 mg) on all other days.     Allergies: Allergies  Allergen Reactions  . Hctz [Hydrochlorothiazide]     Hyponatremia     Social History: The patient  reports that he quit smoking about 76 years ago. His smoking use included cigarettes. He has a 2.00 pack-year smoking history. He has never used smokeless tobacco. He reports that he does not drink alcohol or use drugs.   Family History: The patient's family history includes Deep vein thrombosis in his sister; Heart disease in his mother; Rheum arthritis in his mother. Father died of unknown  causes. Mother died with CHF.   Review of Systems: Please see the history of present illness.   Otherwise, the review of systems is positive for none.   All other systems are reviewed and negative.   Physical Exam: VS:  BP (!) 120/50 (BP Location: Left Arm, Patient Position: Sitting, Cuff Size: Normal)   Pulse 60   Ht 5\' 11"  (1.803 m)   Wt 222 lb 12.8 oz (101.1 kg)   SpO2 96% Comment: at rest  BMI 31.07 kg/m  .  BMI Body mass index is 31.07 kg/m.  Wt Readings from Last 3 Encounters:  12/11/17 222 lb 12.8 oz (101.1 kg)  08/14/13 203 lb (92.1 kg)  02/11/13 206 lb 9.1 oz (93.7 kg)    General: Pleasant. Elderly. He is alert and in no acute distress.  He is obese.  HEENT: Normal.  Neck: Supple, no JVD, carotid bruits, or masses noted.  Cardiac: Regular rate and rhythm.  No edema.  Respiratory:  Lungs are clear to auscultation bilaterally with normal work of breathing.  GI: Soft and nontender.  MS: No deformity or atrophy. Gait and ROM intact.  Skin: Warm and dry. Color is normal.  Neuro:  Strength and sensation are intact and no gross focal deficits noted.  Psych: Alert, appropriate and with normal affect.   LABORATORY DATA:  EKG:  EKG is not ordered today.  Lab Results  Component Value Date   WBC 8.4 10/06/2016   HGB 13.1 10/06/2016   HCT 38.8 (L) 10/06/2016   PLT 174 10/06/2016   GLUCOSE 129 (H) 10/06/2016   ALT 13 (L) 10/06/2016   AST 18 10/06/2016   NA 137 10/06/2016   K 4.0 10/06/2016   CL 101 10/06/2016   CREATININE 1.44 (H) 10/06/2016   BUN 22 (H) 10/06/2016   CO2 28 10/06/2016   TSH 2.970 08/15/2013   INR 1.87 10/06/2016   HGBA1C 6.6 (H) 11/07/2012       BNP (last 3 results) No results for input(s): BNP in the last 8760 hours.  ProBNP (last 3 results) No results for input(s): PROBNP in the last 8760 hours.   Other Studies Reviewed Today:   Assessment/Plan: 1. Chronic diastolic HF - noted with normal EF - he likes salt - he appears to be stable  on 80 mg of Lasix BID - I have asked him to take his evening doses of his medicines around 4 pm - since he goes to bed at 7 - this may help alleviate his nocturia. Will check lab today. I will see him back in about 2 months. Salt restriction is advised. He would like to follow with me in the event [name] does not return to his practice.   2. PAF - on anticoagulation. Has PPM in place.   3. Underlying PPM - he has had recent transmission sent here - may need to get established back with EP but will readdress on return. Dr. Caryl Comes has done his prior implant.   4. Chronic anticoagulation - monitored by his PCP.   5. Advanced age  25. Situational stress - with wife's recent death.   Current medicines are reviewed with the patient today.  The patient does not have concerns regarding medicines other than what has been noted above.  The following changes have been made:  See above.  Labs/ tests ordered today include:    Orders Placed This Encounter  Procedures  . Basic metabolic panel  . CBC  . Pro b natriuretic peptide (BNP)     Disposition:   FU with me in about 2 months.   Patient is agreeable to this plan and will call if any problems develop in the interim.   SignedTruitt Merle, NP  12/11/2017 4:05 PM  [facility] [address] [place], [place]  [address] Phone: [phone] Fax: [phone]

## 2017-12-11 NOTE — Patient Instructions (Addendum)
We will be checking the following labs today - BMET, CBC, BNP  If you have labs (blood work) drawn today and your tests are completely normal, you will receive your results only by: [name] MyChart Message (if you have MyChart) OR . A paper copy in the mail If you have any lab test that is abnormal or we need to change your treatment, we will call you to review the results.   Medication Instructions:    Continue with your current medicines.   Ok to take your evening medicines around 4pm   If you need a refill on your cardiac medications before your next appointment, please call your pharmacy.     Testing/Procedures To Be Arranged:  N/A  Follow-Up:   See me in about 2 months    At [facility], you and your health needs are our priority.  As part of our continuing mission to provide you with exceptional heart care, we have created designated Provider Care Teams.  These Care Teams include your primary Cardiologist (physician) and Advanced Practice Providers (APPs -  Physician Assistants and Nurse Practitioners) who all work together to provide you with the care you need, when you need it.  Special Instructions:  . None  Call the [place] office at [phone] if you have any questions, problems or concerns.

## 2017-12-12 LAB — CBC
Hematocrit: 34.8 % — ABNORMAL LOW (ref 37.5–51.0)
Hemoglobin: 11.8 g/dL — ABNORMAL LOW (ref 13.0–17.7)
MCH: 30.9 pg (ref 26.6–33.0)
MCHC: 33.9 g/dL (ref 31.5–35.7)
MCV: 91 fL (ref 79–97)
Platelets: 187 10*3/uL (ref 150–450)
RBC: 3.82 x10E6/uL — ABNORMAL LOW (ref 4.14–5.80)
RDW: 11.9 % — ABNORMAL LOW (ref 12.3–15.4)
WBC: 8.4 10*3/uL (ref 3.4–10.8)

## 2017-12-12 LAB — BASIC METABOLIC PANEL
BUN/Creatinine Ratio: 21 (ref 10–24)
BUN: 40 mg/dL — ABNORMAL HIGH (ref 10–36)
CO2: 26 mmol/L (ref 20–29)
Calcium: 9.1 mg/dL (ref 8.6–10.2)
Chloride: 95 mmol/L — ABNORMAL LOW (ref 96–106)
Creatinine, Ser: 1.95 mg/dL — ABNORMAL HIGH (ref 0.76–1.27)
GFR calc Af Amer: 33 mL/min/{1.73_m2} — ABNORMAL LOW (ref >59)
GFR calc non Af Amer: 29 mL/min/{1.73_m2} — ABNORMAL LOW (ref >59)
Glucose: 133 mg/dL — ABNORMAL HIGH (ref 65–99)
Potassium: 4.4 mmol/L (ref 3.5–5.2)
Sodium: 137 mmol/L (ref 134–144)

## 2017-12-12 LAB — PRO B NATRIURETIC PEPTIDE: NT-Pro BNP: 3626 pg/mL — ABNORMAL HIGH (ref 0–486)

## 2017-12-13 NOTE — Progress Notes (Signed)
Remote pacemaker transmission.   

## 2018-01-09 LAB — CUP PACEART REMOTE DEVICE CHECK
Battery Impedance: 594 Ohm
Battery Voltage: 2.77 V
Brady Statistic AP VS Percent: 0 %
Implantable Lead Implant Date: 20050322
Implantable Lead Model: 4092
Implantable Lead Model: 5568
Implantable Pulse Generator Implant Date: 20130522
Lead Channel Pacing Threshold Amplitude: 1.125 V
Lead Channel Pacing Threshold Pulse Width: 0.4 ms
Lead Channel Setting Pacing Amplitude: 2.5 V
Lead Channel Setting Pacing Pulse Width: 0.4 ms
Lead Channel Setting Sensing Sensitivity: 2.8 mV
MDC IDC LEAD IMPLANT DT: 20050322
MDC IDC LEAD LOCATION: 753859
MDC IDC LEAD LOCATION: 753860
MDC IDC MSMT BATTERY REMAINING LONGEVITY: 74 mo
MDC IDC MSMT LEADCHNL RA IMPEDANCE VALUE: 557 Ohm
MDC IDC MSMT LEADCHNL RV IMPEDANCE VALUE: 629 Ohm
MDC IDC MSMT LEADCHNL RV PACING THRESHOLD AMPLITUDE: 0.5 V
MDC IDC MSMT LEADCHNL RV PACING THRESHOLD PULSEWIDTH: 0.4 ms
MDC IDC SESS DTM: 20191009203348
MDC IDC SET LEADCHNL RA PACING AMPLITUDE: 2.25 V
MDC IDC STAT BRADY AP VP PERCENT: 89 %
MDC IDC STAT BRADY AS VP PERCENT: 11 %
MDC IDC STAT BRADY AS VS PERCENT: 0 %

## 2018-01-29 ENCOUNTER — Encounter (INDEPENDENT_AMBULATORY_CARE_PROVIDER_SITE_OTHER): Payer: Self-pay

## 2018-01-29 ENCOUNTER — Encounter: Payer: Self-pay | Admitting: Nurse Practitioner

## 2018-01-29 ENCOUNTER — Ambulatory Visit: Payer: Medicare Other | Admitting: Nurse Practitioner

## 2018-01-29 VITALS — BP 142/60 | HR 76 | Ht 71.0 in | Wt 220.0 lb

## 2018-01-29 DIAGNOSIS — Z950 Presence of cardiac pacemaker: Secondary | ICD-10-CM

## 2018-01-29 DIAGNOSIS — I480 Paroxysmal atrial fibrillation: Secondary | ICD-10-CM | POA: Diagnosis not present

## 2018-01-29 DIAGNOSIS — I5032 Chronic diastolic (congestive) heart failure: Secondary | ICD-10-CM

## 2018-01-29 MED ORDER — NITROGLYCERIN 0.4 MG SL SUBL: 0.4000 mg | SUBLINGUAL_TABLET | SUBLINGUAL | 3 refills | Status: DC | PRN

## 2018-01-29 NOTE — Patient Instructions (Addendum)
We will be checking the following labs today - NONE  If you have labs (blood work) drawn today and your tests are completely normal, you will receive your results only by: [name] MyChart Message (if you have MyChart) OR . A paper copy in the mail If you have any lab test that is abnormal or we need to change your treatment, we will call you to review the results.   Medication Instructions:    Continue with your current medicines.  I have sent in a RX for NTG - Use your NTG under your tongue for recurrent chest pain. May take one tablet every 5 minutes. If you are still having discomfort after 3 tablets in 15 minutes, call 911.    If you need a refill on your cardiac medications before your next appointment, please call your pharmacy.     Testing/Procedures To Be Arranged:  N/A  Follow-Up:   See me in 3 months    At [facility], you and your health needs are our priority.  As part of our continuing mission to provide you with exceptional heart care, we have created designated Provider Care Teams.  These Care Teams include your primary Cardiologist (physician) and Advanced Practice Providers (APPs -  Physician Assistants and Nurse Practitioners) who all work together to provide you with the care you need, when you need it.  Special Instructions:  . None  Call the [place] office at [phone] if you have any questions, problems or concerns.

## 2018-01-29 NOTE — Progress Notes (Signed)
CARDIOLOGY OFFICE NOTE  Date:  01/29/2018    [name] Date of Birth: March 25, 1922 Medical Record #[id number]  PCP:  [name], MD  Cardiologist:  Rozetta Nunnery   Chief Complaint  Patient presents with  . Congestive Heart Failure    2 month check - seen for [name]    History of Present Illness: [name] is a 84 y.o. male who presents today for a follow up visit. Seen for [name].   He has a history of chronic diastolic HF, PAF, HTN, CAD with coronary calcifications on prior CT and prior cath in 2018 showing no significant disease, MV regurgitation, obesity, HLD, chronic anticoagulation, underlying PPM (for second degree AV block in 2005 in [place]) and prior SVT with prior ablation in 1988 at [place]. Other issues include obesity, BPH, asthma, gout, hypothyroidism, & GERD.   Noted EF of 55% by echo from 10/2016.   He was seen last by [name] in September. He had had progressive shortness of breath despite escalating doses of diuretics. He had been caring for his wife who is on Hospice. I then met him in 01-18-23 - his wife had passed. He was doing better clinically with a higher dose of Lasix. Felt to be doing ok. His PPM checks have been arranged to be followed here.   Comes in today. Here alone today. He is doing ok. He is looking for someone now to stay with him 24 hours a day - does not wish to be alone at night anymore. He feels like he is doing ok. Shortness of breath is at baseline. Swelling is ok. Weight is down a few pounds. No actual chest pain but he does note that his arms will hurt if he "really exerts himself" - goes away with resting. He notes this has been going on for the past few months - he is not sure if it is worse. He does not have NTG on hand. No problems with his coumadin. Overall, he feels like he is doing ok and holding his own.   Past Medical History:  Diagnosis Date  . 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  Anticoagulant long-term use   . Asthma    until age 58   . Atrial fibrillation (HCC)     on coumadin    . Atrioventricular block, complete ([place])   . BPH (benign prostatic hyperplasia)   . Chronic combined systolic and diastolic CHF (congestive heart failure) (HCC)    EF 40% by ECHO  8/14  - followed by [name]  . Diplopia 08/14/2013  . Encounter for care or replacement of suprapubic tube ([place])   . GERD (gastroesophageal reflux disease)   . Gout   . HOH (hard of hearing)    hearing aids  . Hypertension   . Hyponatremia 2014   HCTZ  . Hypothyroid   . Lumbar disc disease   . Macular degeneration   . Obesity (BMI 30-39.9)   . Orchitis of right testicle   . Osteoporosis   . Pacemaker-Medtronic 2005   generator change  . Sinusitis     Past Surgical History:  Procedure Laterality Date  . ABLATION  1990  . CATARACT EXTRACTION Bilateral   . CYSTOSCOPY N/A 12/11/2012   Procedure: CYSTOSCOPY;  Surgeon: [name], MD;  Location: [facility];  Service: Urology;  Laterality: N/A;  . [place]  . INSERTION OF SUPRAPUBIC CATHETER N/A 12/11/2012  Procedure: INSERTION OF SUPRAPUBIC CATHETER " NEEDS PERC BALLOON LIKE FOR PERC KIDNEY";  Surgeon: [name], MD;  Location: [facility];  Service: Urology;  Laterality: N/A;  . PACEMAKER GENERATOR CHANGE N/A 07/19/2011   Procedure: PACEMAKER GENERATOR CHANGE;  Surgeon: [name], MD;  Location: [facility] CATH LAB;  Service: Cardiovascular;  Laterality: N/A;  . PACEMAKER INSERTION    . ROBOT ASSISTED LAPAROSCOPIC RADICAL PROSTATECTOMY N/A 02/10/2013   Procedure: ROBOTIC ASSISTED LAPAROSCOPIC SIMPLE PROSTATECTOMY;  Surgeon: [name], MD;  Location: [facility];  Service: Urology;  Laterality: N/A;  . SUPRPUBIC TUBE    . TRANSURETHRAL RESECTION OF PROSTATE       Medications: Current Meds  Medication Sig  . beta carotene w/minerals (OCUVITE) tablet Take 1 tablet by mouth at bedtime.  . bisoprolol (ZEBETA) 10 MG tablet Take  10 mg by mouth 2 (two) times daily.  . budesonide-formoterol (SYMBICORT) 160-4.5 MCG/ACT inhaler Inhale 2 puffs into the lungs 2 (two) times daily.  . Calcium Carbonate-Vitamin D (CALCIUM 600 + D PO) Take 1 tablet by mouth daily.  . cholecalciferol (VITAMIN D) 1000 UNITS tablet Take 2,000 Units by mouth daily.  . finasteride (PROSCAR) 5 MG tablet Take 5 mg by mouth daily.   . furosemide (LASIX) 80 MG tablet Take 80 mg by mouth 2 (two) times daily.   . hydrALAZINE (APRESOLINE) 50 MG tablet Take 50 mg by mouth 2 (two) times daily.  . irbesartan (AVAPRO) 300 MG tablet Take 300 mg by mouth daily.  [name] levothyroxine (SYNTHROID, LEVOTHROID) 75 MCG tablet Take 75 mcg by mouth daily before breakfast.  . Multiple Vitamin (MULTIVITAMIN WITH MINERALS) TABS tablet Take 1 tablet by mouth daily.  [name] omega-3 acid ethyl esters (LOVAZA) 1 G capsule Take 1 g by mouth every morning.  [name] omeprazole (PRILOSEC) 20 MG capsule Take 20 mg by mouth at bedtime.   . simethicone (GAS-X) 80 MG chewable tablet Chew 1 tablet (80 mg total) by mouth every 6 (six) hours as needed (bloating).  . temazepam (RESTORIL) 15 MG capsule Take 15 mg by mouth daily.  . traMADol (ULTRAM) 50 MG tablet Take 1 tablet (50 mg total) by mouth every 6 (six) hours as needed for moderate pain. Post-operatively.  . warfarin (COUMADIN) 3 MG tablet Take 1.5-3 mg by mouth daily. Takes 0.5 tablet (1.5 mg) on Wednesday and Saturday and Take 1 tablet (3 mg) on all other days.     Allergies: Allergies  Allergen Reactions  . Hctz [Hydrochlorothiazide]     Hyponatremia     Social History: The patient  reports that he quit smoking about 76 years ago. His smoking use included cigarettes. He has a 2.00 pack-year smoking history. He has never used smokeless tobacco. He reports that he does not drink alcohol or use drugs.   Family History: The patient's family history includes Deep vein thrombosis in his sister; Heart disease in his mother; Rheum arthritis in  his mother.   Review of Systems: Please see the history of present illness.   Otherwise, the review of systems is positive for none.   All other systems are reviewed and negative.   Physical Exam: VS:  BP (!) 142/60 (BP Location: Left Arm, Patient Position: Sitting, Cuff Size: Normal)   Pulse 76   Ht 5' 11"  (1.803 m)   Wt 220 lb (99.8 kg)   SpO2 95%   BMI 30.68 kg/m  .  BMI Body mass index is 30.68 kg/m.  Wt Readings from Last 3 Encounters:  01/29/18 220 lb (99.8 kg)  12/11/17 222 lb 12.8 oz (101.1 kg)  08/14/13 203 lb (92.1 kg)    General: Pleasant. Elderly male. He is just delightful. Alert and in no acute distress.   HEENT: Normal.  Neck: Supple, no JVD, carotid bruits, or masses noted.  Cardiac: Regular rate and rhythm today. Trace edema.  Respiratory:  Lungs are clear to auscultation bilaterally with normal work of breathing.  GI: Soft and nontender.  MS: No deformity or atrophy. Gait and ROM intact. He is using a cane.  Skin: Warm and dry. Color is normal.  Neuro:  Strength and sensation are intact and no gross focal deficits noted.  Psych: Alert, appropriate and with normal affect.   LABORATORY DATA:  EKG:  EKG is not ordered today.  Lab Results  Component Value Date   WBC 8.4 12/11/2017   HGB 11.8 (L) 12/11/2017   HCT 34.8 (L) 12/11/2017   PLT 187 12/11/2017   GLUCOSE 133 (H) 12/11/2017   ALT 13 (L) 10/06/2016   AST 18 10/06/2016   NA 137 12/11/2017   K 4.4 12/11/2017   CL 95 (L) 12/11/2017   CREATININE 1.95 (H) 12/11/2017   BUN 40 (H) 12/11/2017   CO2 26 12/11/2017   TSH 2.970 08/15/2013   INR 1.87 10/06/2016   HGBA1C 6.6 (H) 11/07/2012      BNP (last 3 results) No results for input(s): BNP in the last 8760 hours.  ProBNP (last 3 results) Recent Labs    12/11/17 1619  PROBNP 3,626*     Other Studies Reviewed Today:   Assessment/Plan: 1. Chronic diastolic HF - noted with normal EF - he is holding his own. Managed medically. No changes  made today. I have left him on his current regimen.   2. PAF - on anticoagulation. Has PPM in place. Sinus on exam here today.   3. Underlying PPM - his transmissions have been sent here - we may need to establish with Dr. Caryl Comes (did prior implant).    4. Chronic anticoagulation - monitored by his PCP. No problems noted.   5. Advanced age  26. Situational stress - he is doing ok - looking for around the clock care.   Current medicines are reviewed with the patient today.  The patient does not have concerns regarding medicines other than what has been noted above.  The following changes have been made:  See above.  Labs/ tests ordered today include:   No orders of the defined types were placed in this encounter.    Disposition:   FU with me in about 3 months.   Patient is agreeable to this plan and will call if any problems develop in the interim.   SignedTruitt Merle, NP  01/29/2018 2:54 PM  [facility] [address] [place], [place]  [address] Phone: [phone] Fax: [phone]

## 2018-02-19 ENCOUNTER — Other Ambulatory Visit: Payer: Self-pay | Admitting: Cardiology

## 2018-02-19 MED ORDER — FUROSEMIDE 80 MG PO TABS: 80.0000 mg | ORAL_TABLET | Freq: Two times a day (BID) | ORAL | 0 refills | Status: DC

## 2018-02-19 NOTE — Telephone Encounter (Signed)
On 01/29/2018.

## 2018-02-19 NOTE — Telephone Encounter (Signed)
Refill for furosemide sent to [facility] as requested. Patient was just seen by Truitt Merle, NP at the [facility] office.

## 2018-02-19 NOTE — Telephone Encounter (Signed)
° ° ° °  1. Which medications need to be refilled? (please list name of each medication and dose if known) Furosemide 80mg  tablet 1BID  2. Which pharmacy/location (including street and city if local pharmacy) is medication to be sent to? [facility]  3. Do they need a 30 day or 90 day supply? [place]

## 2018-03-15 ENCOUNTER — Encounter: Payer: Self-pay | Admitting: Cardiology

## 2018-03-21 ENCOUNTER — Telehealth: Payer: Self-pay

## 2018-03-21 NOTE — Telephone Encounter (Signed)
Left message for patient to remind of missed remote transmission.  

## 2018-04-17 ENCOUNTER — Encounter: Payer: Self-pay | Admitting: Nurse Practitioner

## 2018-04-22 ENCOUNTER — Other Ambulatory Visit: Payer: Self-pay | Admitting: Cardiology

## 2018-04-22 NOTE — Telephone Encounter (Signed)
Please refill as patient is seen at the [facility]. Thanks!

## 2018-05-07 ENCOUNTER — Ambulatory Visit: Payer: Medicare Other | Admitting: Nurse Practitioner

## 2018-05-07 NOTE — Progress Notes (Deleted)
CARDIOLOGY OFFICE NOTE  Date:  05/07/2018    [name] Date of Birth: [birth year] Medical Record #[id number]  PCP:  [name], MD  Cardiologist:  [name] & ***    No chief complaint on file.   History of Present Illness: [name] is a 85 y.o. male who presents today for a ***   Comes in today. Here with   Past Medical History:  Diagnosis Date  . 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 Anticoagulant long-term use   . Asthma    until age 46   . Atrial fibrillation (HCC)     on coumadin    . Atrioventricular block, complete ([place])   . BPH (benign prostatic hyperplasia)   . Chronic combined systolic and diastolic CHF (congestive heart failure) (HCC)    EF 40% by ECHO  8/14  - followed by [name]  . Diplopia 08/14/2013  . Encounter for care or replacement of suprapubic tube ([place])   . GERD (gastroesophageal reflux disease)   . Gout   . HOH (hard of hearing)    hearing aids  . Hypertension   . Hyponatremia 2014   HCTZ  . Hypothyroid   . Lumbar disc disease   . Macular degeneration   . Obesity (BMI 30-39.9)   . Orchitis of right testicle   . Osteoporosis   . Pacemaker-Medtronic 2005   generator change  . Sinusitis     Past Surgical History:  Procedure Laterality Date  . ABLATION  1990  . CATARACT EXTRACTION Bilateral   . CYSTOSCOPY N/A 12/11/2012   Procedure: CYSTOSCOPY;  Surgeon: [name], MD;  [facility];  Service: Urology;  Laterality: N/A;  . [place]  . INSERTION OF SUPRAPUBIC CATHETER N/A 12/11/2012   Procedure: INSERTION OF SUPRAPUBIC CATHETER " NEEDS PERC BALLOON LIKE FOR PERC KIDNEY";  Surgeon: [name], MD;  [facility];  Service: Urology;  Laterality: N/A;  . PACEMAKER GENERATOR CHANGE N/A 07/19/2011   Procedure: PACEMAKER GENERATOR CHANGE;  Surgeon: [name], MD;  Location: [facility] CATH LAB;  Service: Cardiovascular;  Laterality: N/A;  . PACEMAKER INSERTION    . ROBOT ASSISTED  LAPAROSCOPIC RADICAL PROSTATECTOMY N/A 02/10/2013   Procedure: ROBOTIC ASSISTED LAPAROSCOPIC SIMPLE PROSTATECTOMY;  Surgeon: [name], MD;  Location: [facility];  Service: Urology;  Laterality: N/A;  . SUPRPUBIC TUBE    . TRANSURETHRAL RESECTION OF PROSTATE       Medications: No outpatient medications have been marked as taking for the 05/07/18 encounter (Appointment) with Burtis Junes, NP.     Allergies: Allergies  Allergen Reactions  . Hctz [Hydrochlorothiazide]     Hyponatremia     Social History: The patient  reports that he quit smoking about 76 years ago. His smoking use included cigarettes. He has a 2.00 pack-year smoking history. He has never used smokeless tobacco. He reports that he does not drink alcohol or use drugs.   Family History: The patient's ***family history includes Deep vein thrombosis in his sister; Heart disease in his mother; Rheum arthritis in his mother.   Review of Systems: Please see the history of present illness.   Otherwise, the review of systems is positive for {NONE DEFAULTED:18576::"none"}.   All other systems are reviewed and negative.   Physical Exam: VS:  There were no vitals taken for this visit. [name]  BMI There is no height or weight on file to calculate BMI.  Wt Readings  from Last 3 Encounters:  01/29/18 220 lb (99.8 kg)  12/11/17 222 lb 12.8 oz (101.1 kg)  08/14/13 203 lb (92.1 kg)    General: Pleasant. Well developed, well nourished and in no acute distress.   HEENT: Normal.  Neck: Supple, no JVD, carotid bruits, or masses noted.  Cardiac: ***Regular rate and rhythm. No murmurs, rubs, or gallops. No edema.  Respiratory:  Lungs are clear to auscultation bilaterally with normal work of breathing.  GI: Soft and nontender.  MS: No deformity or atrophy. Gait and ROM intact.  Skin: Warm and dry. Color is normal.  Neuro:  Strength and sensation are intact and no gross focal deficits noted.  Psych: Alert, appropriate and with normal  affect.   LABORATORY DATA:  EKG:  EKG {ACTION; IS/IS MBT:[id number]} ordered today. This demonstrates ***.  Lab Results  Component Value Date   WBC 8.4 12/11/2017   HGB 11.8 (L) 12/11/2017   HCT 34.8 (L) 12/11/2017   PLT 187 12/11/2017   GLUCOSE 133 (H) 12/11/2017   ALT 13 (L) 10/06/2016   AST 18 10/06/2016   NA 137 12/11/2017   K 4.4 12/11/2017   CL 95 (L) 12/11/2017   CREATININE 1.95 (H) 12/11/2017   BUN 40 (H) 12/11/2017   CO2 26 12/11/2017   TSH 2.970 08/15/2013   INR 1.87 10/06/2016   HGBA1C 6.6 (H) 11/07/2012       BNP (last 3 results) No results for input(s): BNP in the last 8760 hours.  ProBNP (last 3 results) Recent Labs    12/11/17 1619  PROBNP 3,626*     Other Studies Reviewed Today:   Assessment/Plan:   Current medicines are reviewed with the patient today.  The patient does not have concerns regarding medicines other than what has been noted above.  The following changes have been made:  See above.  Labs/ tests ordered today include:   No orders of the defined types were placed in this encounter.    Disposition:   FU with *** in {gen number 4-53:646803} {Days to years:10300}.   Patient is agreeable to this plan and will call if any problems develop in the interim.   SignedTruitt Merle, NP  05/07/2018 7:51 AM  [facility] [address] [place], [place]  [address] Phone: [phone] Fax: [phone]

## 2018-05-08 ENCOUNTER — Encounter: Payer: Self-pay | Admitting: Nurse Practitioner

## 2018-05-21 ENCOUNTER — Telehealth: Payer: Self-pay | Admitting: Nurse Practitioner

## 2018-05-21 NOTE — Telephone Encounter (Signed)
Primary Cardiologist: [name]  Pt contacted. History and symptoms reviewed - adamant about "being checked over" and wants to keep his OV with me next week. Patient will f/u with [facility] provider as scheduled - despite trying to move his appointment - has no virtual access. I have moved his visit to 12 noon.  Negative COVID 19 screening.  Pt advised that we are restricting visitors at this time and request that only patients present for check in prior to their appointment. All other visitors should remain in their car. If necessary, only one visitor may come with the patient, into the building. For everyone's safety, all patients and visitors entering our practice area should expect to be screened again prior to entering our waiting area.   Burtis Junes, RN, [place] [address] [place], [place]  [address] [phone]

## 2018-05-28 ENCOUNTER — Telehealth: Payer: Self-pay | Admitting: Cardiology

## 2018-05-28 ENCOUNTER — Telehealth (INDEPENDENT_AMBULATORY_CARE_PROVIDER_SITE_OTHER): Payer: Medicare Other | Admitting: Nurse Practitioner

## 2018-05-28 ENCOUNTER — Ambulatory Visit: Payer: Medicare Other | Admitting: Nurse Practitioner

## 2018-05-28 ENCOUNTER — Encounter: Payer: Self-pay | Admitting: Nurse Practitioner

## 2018-05-28 ENCOUNTER — Other Ambulatory Visit: Payer: Self-pay

## 2018-05-28 ENCOUNTER — Ambulatory Visit (INDEPENDENT_AMBULATORY_CARE_PROVIDER_SITE_OTHER): Payer: Medicare Other | Admitting: *Deleted

## 2018-05-28 VITALS — BP 114/60 | Wt 220.0 lb

## 2018-05-28 DIAGNOSIS — I480 Paroxysmal atrial fibrillation: Secondary | ICD-10-CM

## 2018-05-28 DIAGNOSIS — Z79899 Other long term (current) drug therapy: Secondary | ICD-10-CM | POA: Diagnosis not present

## 2018-05-28 DIAGNOSIS — I5032 Chronic diastolic (congestive) heart failure: Secondary | ICD-10-CM | POA: Diagnosis not present

## 2018-05-28 DIAGNOSIS — Z950 Presence of cardiac pacemaker: Secondary | ICD-10-CM

## 2018-05-28 DIAGNOSIS — Z7901 Long term (current) use of anticoagulants: Secondary | ICD-10-CM

## 2018-05-28 NOTE — Telephone Encounter (Signed)
-----   Message from Tamsen Snider sent at 05/28/2018  1:34 PM EDT ----- Louretta Shorten,  This pt had a phone visit with Cecille Rubin today and hasn't sent device transmission in .  Cecille Rubin told him today to go ahead and do this.  Cecille Rubin just wanted you to keep an eye on it.   Thanks Stay safe, Danielle

## 2018-05-28 NOTE — Patient Instructions (Addendum)
After Visit Summary:  We will be checking the following labs today - NONE   Medication Instructions:    Continue with your current medicines.   Ok to use extra Lasix (Furosemide) as you are doing.    If you need a refill on your cardiac medications before your next appointment, please call your pharmacy.     Testing/Procedures To Be Arranged:  N/A  Follow-Up:   See me in July    At [facility], you and your health needs are our priority.  As part of our continuing mission to provide you with exceptional heart care, we have created designated Provider Care Teams.  These Care Teams include your primary Cardiologist (physician) and Advanced Practice Providers (APPs -  Physician Assistants and Nurse Practitioners) who all work together to provide you with the care you need, when you need it.  Special Instructions:  . Try to transmit your pacemaker for me in the next day or so.  . Stay safe, stay at home and wash your hands frequently for at least 20 seconds!  Call the [place] office at [phone] if you have any questions, problems or concerns.

## 2018-05-28 NOTE — Progress Notes (Signed)
Virtual Visit via Telephone Note    Evaluation Performed:  Follow-up visit  This visit type was conducted due to national recommendations for restrictions regarding the COVID-19 Pandemic (e.g. social distancing).  This format is felt to be most appropriate for this patient at this time.  All issues noted in this document were discussed and addressed.  No physical exam was performed (except for noted visual exam findings with Video Visits).  Please refer to the patient's chart (MyChart message for video visits and phone note for telephone visits) for the patient's consent to telehealth for [facility]  Date:  05/28/2018   ID:  [name], DOB 1922/03/31, MRN [id number]  Patient Location:  Home  Provider location:   Home  PCP:  [name], MD  Cardiologist:  Rozetta Nunnery) Electrophysiologist:  None   Chief Complaint:  Follow up visit.   History of Present Illness:    [name] is a 85 y.o. male who presents via audio/video conferencing for a telehealth visit today.  Seen for [name].   He has a history of chronic diastolic HF, PAF - on Coumadin, HTN, CAD with coronary calcifications on prior CT and prior cath in 2018 showing no significant disease, MV regurgitation, obesity, HLD, chronic anticoagulation, underlying PPM (for second degree AV block in 2005 in [place]) and prior SVT with prior ablation in 1988 at [place]. Other issues include obesity, BPH, asthma, gout, hypothyroidism, & GERD.  Noted EF of 55% by echo from 10/2016.  He was seen last by [name] in September of 2019. He had had progressive shortness of breath despite escalating doses of diuretics. He had been caring for his wife who is on Hospice.I then met him in 12-30-17 - his wife had passed. He was doing better clinically with a higher dose of Lasix. Felt to be doing ok. His PPM checks were arranged to be followed here.   I have followed him since - last seen in December of 2019 -  he was looking for someone now to stay with him 24 hours a day. His shortness of breath was stable for him. No actual chest pain unless he really exerted himself physically No NTG use. No problems with his coumadin noted. Overall felt to be holding his own.   The patient does not symptoms concerning for COVID-19 infection (fever, chills, cough, or new shortness of breath).   He is home today - he has given consent for this visit. Now has full time in home help -her name is Stanton Kidney. That has been such a blessing for him and he is happy he can stay in his own home. Feeling ok. Breathing is stable for him. He will use occasional extra Lasix. His weight has been stable by his report. Swelling is stable. His coumadin has been checked by Dr. Brigitte Pulse. No bleeding/bruising. Not dizzy. He is staying in and not going out.     Prior CV studies:   The following studies were reviewed today:  Remote from 2017-12-30 reviewed. 18.4% mode switched- known PAF, warfarin. [facility] 03/13/18.Debroah Loop, RN, BSN, CCDS  Past Medical History:  Diagnosis Date  . 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 Anticoagulant long-term use   . Asthma    until age 53   . Atrial fibrillation (HCC)     on coumadin    . Atrioventricular block, complete ([place])   . BPH (benign prostatic hyperplasia)   . Chronic combined systolic and diastolic CHF (  congestive heart failure) ([place])    EF 40% by ECHO  8/14  - followed by [name]  . Diplopia 08/14/2013  . Encounter for care or replacement of suprapubic tube ([place])   . GERD (gastroesophageal reflux disease)   . Gout   . HOH (hard of hearing)    hearing aids  . Hypertension   . Hyponatremia 2014   HCTZ  . Hypothyroid   . Lumbar disc disease   . Macular degeneration   . Obesity (BMI 30-39.9)   . Orchitis of right testicle   . Osteoporosis   . Pacemaker-Medtronic 2005   generator change  . Sinusitis    Past Surgical History:  Procedure Laterality Date  . ABLATION  1990  .  CATARACT EXTRACTION Bilateral   . CYSTOSCOPY N/A 12/11/2012   Procedure: CYSTOSCOPY;  Surgeon: [name], MD;  Location: [facility];  Service: Urology;  Laterality: N/A;  . [place]  . INSERTION OF SUPRAPUBIC CATHETER N/A 12/11/2012   Procedure: INSERTION OF SUPRAPUBIC CATHETER " NEEDS PERC BALLOON LIKE FOR PERC KIDNEY";  Surgeon: [name], MD;  [facility]: [facility];  Service: Urology;  Laterality: N/A;  . PACEMAKER GENERATOR CHANGE N/A 07/19/2011   Procedure: PACEMAKER GENERATOR CHANGE;  Surgeon: [name], MD;  Location: [facility] CATH LAB;  Service: Cardiovascular;  Laterality: N/A;  . PACEMAKER INSERTION    . ROBOT ASSISTED LAPAROSCOPIC RADICAL PROSTATECTOMY N/A 02/10/2013   Procedure: ROBOTIC ASSISTED LAPAROSCOPIC SIMPLE PROSTATECTOMY;  Surgeon: [name], MD;  [facility];  Service: Urology;  Laterality: N/A;  . SUPRPUBIC TUBE    . TRANSURETHRAL RESECTION OF PROSTATE       Current Meds  Medication Sig  . beta carotene w/minerals (OCUVITE) tablet Take 1 tablet by mouth at bedtime.  . bisoprolol (ZEBETA) 10 MG tablet Take 10 mg by mouth 2 (two) times daily.  . budesonide-formoterol (SYMBICORT) 160-4.5 MCG/ACT inhaler Inhale 2 puffs into the lungs 2 (two) times daily.  . Calcium Carbonate-Vitamin D (CALCIUM 600 + D PO) Take 1 tablet by mouth daily.  . cholecalciferol (VITAMIN D) 1000 UNITS tablet Take 2,000 Units by mouth daily.  . finasteride (PROSCAR) 5 MG tablet Take 5 mg by mouth daily.   . furosemide (LASIX) 80 MG tablet TAKE 1 TABLET BY MOUTH TWO  TIMES DAILY  . hydrALAZINE (APRESOLINE) 50 MG tablet Take 50 mg by mouth 2 (two) times daily.  . irbesartan (AVAPRO) 300 MG tablet Take 300 mg by mouth daily.  [name] levothyroxine (SYNTHROID, LEVOTHROID) 75 MCG tablet Take 75 mcg by mouth daily before breakfast.  . Multiple Vitamin (MULTIVITAMIN WITH MINERALS) TABS tablet Take 1 tablet by mouth daily.  [name] omega-3 acid ethyl esters (LOVAZA) 1 G capsule Take 1 g  by mouth every morning.  [name] omeprazole (PRILOSEC) 20 MG capsule Take 20 mg by mouth at bedtime.   . simethicone (GAS-X) 80 MG chewable tablet Chew 1 tablet (80 mg total) by mouth every 6 (six) hours as needed (bloating).  . temazepam (RESTORIL) 15 MG capsule Take 15 mg by mouth daily.  . traMADol (ULTRAM) 50 MG tablet Take 1 tablet (50 mg total) by mouth every 6 (six) hours as needed for moderate pain. Post-operatively.     Allergies:   Hctz [hydrochlorothiazide]   Social History   Tobacco Use  . Smoking status: Former Smoker    Packs/day: 1.00    Years: 2.00    Pack years: 2.00    Types: Cigarettes  Last attempt to quit: 07/17/1941    Years since quitting: 76.9  . Smokeless tobacco: Never Used  Substance Use Topics  . Alcohol use: No  . Drug use: No     Family Hx: The patient's family history includes Deep vein thrombosis in his sister; Heart disease in his mother; Rheum arthritis in his mother.  ROS:   Please see the history of present illness.     All other systems reviewed and are negative.   Labs/Other Tests and Data Reviewed:    Recent Labs: 12/11/2017: BUN 40; Creatinine, Ser 1.95; Hemoglobin 11.8; NT-Pro BNP 3,626; Platelets 187; Potassium 4.4; Sodium 137   Recent Lipid Panel No results found for: CHOL, TRIG, HDL, CHOLHDL, LDLCALC, LDLDIRECT        Lab Results  Component Value Date   INR 1.87 10/06/2016   INR 2.25 (H) 04/22/2015   INR 1.21 02/10/2013      Objective:    Vital Signs:  BP 114/60   Wt 220 lb (99.8 kg)   BMI 30.68 kg/m    He is alert and responsive over the phone. Little hard of hearing. Not short of breath with talking.   ASSESSMENT & PLAN:    1. Chronic diastolic HF- noted with normal EF - he is continuing to hold his own. No changes made today. He uses extra diuretic as needed.   2. PAF- on anticoagulation. Has PPM in place.  3. Underlying PPM- he still needs to establish with Dr. Caryl Comes (he did his prior implant) - I did  ask him to send a remote check.    4. Chronic anticoagulation- monitored by his PCP. No problems reported.    5. Advanced age  30 COVID-19 Education: The signs and symptoms of COVID-19 were discussed with the patient and how to seek care for testing (follow up with PCP or arrange E-visit).  The importance of social distancing was discussed today.  Patient Risk:   After full review of this patient's clinical status, I feel that they are at least moderate risk at this time.  Time:   Today, I have spent 15 minutes with the patient with telehealth technology discussing the above issues.     Medication Adjustments/Labs and Tests Ordered: Current medicines are reviewed at length with the patient today.  Concerns regarding medicines are outlined above.   Tests Ordered: No orders of the defined types were placed in this encounter.  Medication Changes: No orders of the defined types were placed in this encounter.   Disposition:  Follow up with me in July. I encouraged him to call us if he has problems. He verbalizes understanding.   Amie Critchley, NP  05/28/2018 12:12 PM    [place]

## 2018-05-28 NOTE — Telephone Encounter (Signed)
Spoke w/ pt and requested that he send a manual transmission w/ his home monitor. Pt verbalized that he knows how to use the monitor and is aware that if he needs help to call back to the office and we will help him.

## 2018-05-29 ENCOUNTER — Other Ambulatory Visit: Payer: Self-pay

## 2018-05-29 LAB — CUP PACEART REMOTE DEVICE CHECK
Battery Voltage: 2.77 V
Brady Statistic AS VP Percent: 13 %
Date Time Interrogation Session: 20200331204144
Implantable Lead Implant Date: 20050322
Implantable Lead Implant Date: 20050322
Implantable Lead Location: 753859
Implantable Lead Model: 4092
Implantable Pulse Generator Implant Date: 20130522
Lead Channel Pacing Threshold Amplitude: 1.25 V
Lead Channel Pacing Threshold Pulse Width: 0.4 ms
Lead Channel Setting Pacing Amplitude: 2.5 V
Lead Channel Setting Pacing Pulse Width: 0.4 ms
Lead Channel Setting Sensing Sensitivity: 2.8 mV
MDC IDC LEAD LOCATION: 753860
MDC IDC MSMT BATTERY IMPEDANCE: 745 Ohm
MDC IDC MSMT BATTERY REMAINING LONGEVITY: 66 mo
MDC IDC MSMT LEADCHNL RA IMPEDANCE VALUE: 603 Ohm
MDC IDC MSMT LEADCHNL RA PACING THRESHOLD PULSEWIDTH: 0.4 ms
MDC IDC MSMT LEADCHNL RV IMPEDANCE VALUE: 623 Ohm
MDC IDC MSMT LEADCHNL RV PACING THRESHOLD AMPLITUDE: 0.5 V
MDC IDC SET LEADCHNL RA PACING AMPLITUDE: 2.5 V
MDC IDC STAT BRADY AP VP PERCENT: 87 %
MDC IDC STAT BRADY AP VS PERCENT: 0 %
MDC IDC STAT BRADY AS VS PERCENT: 0 %

## 2018-05-30 NOTE — Telephone Encounter (Signed)
Transmission received 05/28/2018

## 2018-06-05 ENCOUNTER — Encounter: Payer: Self-pay | Admitting: Cardiology

## 2018-06-05 NOTE — Progress Notes (Signed)
Remote pacemaker transmission.   

## 2018-06-26 ENCOUNTER — Other Ambulatory Visit: Payer: Self-pay | Admitting: Nurse Practitioner

## 2018-08-27 ENCOUNTER — Ambulatory Visit (INDEPENDENT_AMBULATORY_CARE_PROVIDER_SITE_OTHER): Payer: Medicare Other | Admitting: *Deleted

## 2018-08-27 DIAGNOSIS — I480 Paroxysmal atrial fibrillation: Secondary | ICD-10-CM | POA: Diagnosis not present

## 2018-08-28 ENCOUNTER — Telehealth: Payer: Self-pay | Admitting: Nurse Practitioner

## 2018-08-28 ENCOUNTER — Telehealth: Payer: Self-pay

## 2018-08-28 NOTE — Telephone Encounter (Signed)
New message:    Patient returning a call back. Please call patient.

## 2018-08-28 NOTE — Telephone Encounter (Signed)
Unable to speak  with patient to remind of missed remote transmission 

## 2018-08-28 NOTE — Telephone Encounter (Signed)
LMOVM reminding pt of his remote.

## 2018-08-29 ENCOUNTER — Telehealth: Payer: Self-pay | Admitting: Student

## 2018-08-29 LAB — CUP PACEART REMOTE DEVICE CHECK
Battery Impedance: 770 Ohm
Battery Remaining Longevity: 66 mo
Battery Voltage: 2.78 V
Brady Statistic AP VP Percent: 84 %
Brady Statistic AP VS Percent: 0 %
Brady Statistic AS VP Percent: 16 %
Brady Statistic AS VS Percent: 0 %
Date Time Interrogation Session: 20200701201134
Implantable Lead Implant Date: 20050322
Implantable Lead Implant Date: 20050322
Implantable Lead Location: 753859
Implantable Lead Location: 753860
Implantable Lead Model: 4092
Implantable Lead Model: 5568
Implantable Pulse Generator Implant Date: 20130522
Lead Channel Impedance Value: 584 Ohm
Lead Channel Impedance Value: 667 Ohm
Lead Channel Pacing Threshold Amplitude: 0.5 V
Lead Channel Pacing Threshold Amplitude: 1.25 V
Lead Channel Pacing Threshold Pulse Width: 0.4 ms
Lead Channel Pacing Threshold Pulse Width: 0.4 ms
Lead Channel Setting Pacing Amplitude: 2.5 V
Lead Channel Setting Pacing Amplitude: 2.5 V
Lead Channel Setting Pacing Pulse Width: 0.4 ms
Lead Channel Setting Sensing Sensitivity: 2.8 mV

## 2018-08-29 NOTE — Telephone Encounter (Signed)
Spoke with daughter regarding AF since February. He is known PAF. They know to call back if patient is having symptoms.    Legrand Como [address]" [place], PA-C 08/29/2018 8:14 AM

## 2018-09-08 ENCOUNTER — Encounter: Payer: Self-pay | Admitting: Cardiology

## 2018-09-08 NOTE — Progress Notes (Signed)
Remote pacemaker transmission.   

## 2018-09-10 ENCOUNTER — Telehealth: Payer: Self-pay | Admitting: Nurse Practitioner

## 2018-09-10 NOTE — Progress Notes (Addendum)
CARDIOLOGY OFFICE NOTE  Date:  09/11/2018    [name] Date of Birth: [birth year] Medical Record #[id number]  PCP:  [name], MD  Cardiologist:  [name]   Chief Complaint  Patient presents with  . Follow-up    4 month check.     History of Present Illness: [name] is a 85 y.o. male who presents today for a follow up visit. Seen for [name].   He has a history of chronic diastolic HF, PAF - on Coumadin, HTN, CAD with coronary calcifications on prior CT and prior cath in 2018 showing no significant disease, MV regurgitation, obesity, HLD, chronic anticoagulation, underlying PPM (for second degree AV block in 2005 in [place]) and prior SVT with prior ablation in 1988 at [place]. Other issues include obesity, BPH, asthma, gout, hypothyroidism, & GERD.  Noted EF of 55% by echo from 10/2016.  He was seen last by Dr. Gonzella Lex September of 2019. He hadhad progressive shortness of breath despite escalating doses of diuretics. He hadbeen caring for his wife who is on Hospice.I then met him in October of 2019 and assumed his care - his wife had passed. He was doing better clinically with a higher dose of Lasix. Felt to be doing ok. His PPM checks were arranged to be followed here.  I have followed him since - last seen in the office in December of 2019 - he was looking for someone to stay with him 24 hours a day. His shortness of breath was stable for him. No actual chest pain unless he really exerted himself physically No NTG use. No problems with his coumadin noted. Overall felt to be holding his own. We did a telehealth visit back in March - he was doing ok - had full time help in the home [facility]).  His coumadin is checked by Dr. Brigitte Pulse.   The patient does not have symptoms concerning for COVID-19 infection (fever, chills, cough, or new shortness of breath).   Comes in today. Here with [facility]. He is quite happy that she is his full time caregiver - this has  allowed him to stay home. They have been pretty active. He is getting out more. Seems to be doing more. He notes that if he does too much, his arms will hurt - he has used NTG on one occasion - he is hesitant to use more than this - thought he could get "hooked on NTG". Breathing is a little short at time - he will occasionally use an extra Lasix - not too often. No falls. No recent labs. He is having more issues with his hearing today - one of his aids broke on the way here. He is so apologetic about this. Little dizzy - notes his BP is running low at home.   Past Medical History:  Diagnosis Date  . 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 Anticoagulant long-term use   . Asthma    until age 44   . Atrial fibrillation (HCC)     on coumadin    . Atrioventricular block, complete ([place])   . BPH (benign prostatic hyperplasia)   . Chronic combined systolic and diastolic CHF (congestive heart failure) (HCC)    EF 40% by ECHO  8/14  - followed by [name]  . Diplopia 08/14/2013  . Encounter for care or replacement of suprapubic tube ([place])   . GERD (gastroesophageal reflux disease)   . Gout   . HOH (hard  of hearing)    hearing aids  . Hypertension   . Hyponatremia 2014   HCTZ  . Hypothyroid   . Lumbar disc disease   . Macular degeneration   . Obesity (BMI 30-39.9)   . Orchitis of right testicle   . Osteoporosis   . Pacemaker-Medtronic 2005   generator change  . Sinusitis     Past Surgical History:  Procedure Laterality Date  . ABLATION  1990  . CATARACT EXTRACTION Bilateral   . CYSTOSCOPY N/A 12/11/2012   Procedure: CYSTOSCOPY;  Surgeon: [name], MD;  [facility];  Service: Urology;  Laterality: N/A;  . [place]  . INSERTION OF SUPRAPUBIC CATHETER N/A 12/11/2012   Procedure: INSERTION OF SUPRAPUBIC CATHETER " NEEDS PERC BALLOON LIKE FOR PERC KIDNEY";  Surgeon: [name], MD;  [facility];  Service: Urology;  Laterality: N/A;  . PACEMAKER  GENERATOR CHANGE N/A 07/19/2011   Procedure: PACEMAKER GENERATOR CHANGE;  Surgeon: [name], MD;  Location: [facility] CATH LAB;  Service: Cardiovascular;  Laterality: N/A;  . PACEMAKER INSERTION    . ROBOT ASSISTED LAPAROSCOPIC RADICAL PROSTATECTOMY N/A 02/10/2013   Procedure: ROBOTIC ASSISTED LAPAROSCOPIC SIMPLE PROSTATECTOMY;  Surgeon: [name], MD;  [facility];  Service: Urology;  Laterality: N/A;  . SUPRPUBIC TUBE    . TRANSURETHRAL RESECTION OF PROSTATE       Medications: Current Meds  Medication Sig  . beta carotene w/minerals (OCUVITE) tablet Take 1 tablet by mouth at bedtime.  . bisoprolol (ZEBETA) 10 MG tablet Take 10 mg by mouth 2 (two) times daily.  . budesonide-formoterol (SYMBICORT) 160-4.5 MCG/ACT inhaler Inhale 2 puffs into the lungs 2 (two) times daily.  . Calcium Carbonate-Vitamin D (CALCIUM 600 + D PO) Take 1 tablet by mouth daily.  . cholecalciferol (VITAMIN D) 1000 UNITS tablet Take 2,000 Units by mouth daily.  . finasteride (PROSCAR) 5 MG tablet Take 5 mg by mouth daily.   . furosemide (LASIX) 80 MG tablet TAKE 1 TABLET BY MOUTH TWO  TIMES DAILY  . hydrALAZINE (APRESOLINE) 50 MG tablet Take 50 mg by mouth 2 (two) times daily.  . irbesartan (AVAPRO) 300 MG tablet Take 300 mg by mouth daily.  [name] levothyroxine (SYNTHROID, LEVOTHROID) 75 MCG tablet Take 75 mcg by mouth daily before breakfast.  . Multiple Vitamin (MULTIVITAMIN WITH MINERALS) TABS tablet Take 1 tablet by mouth daily.  [name] omega-3 acid ethyl esters (LOVAZA) 1 G capsule Take 1 g by mouth every morning.  [name] omeprazole (PRILOSEC) 20 MG capsule Take 20 mg by mouth at bedtime.   . simethicone (GAS-X) 80 MG chewable tablet Chew 1 tablet (80 mg total) by mouth every 6 (six) hours as needed (bloating).  . temazepam (RESTORIL) 15 MG capsule Take 15 mg by mouth daily.  . traMADol (ULTRAM) 50 MG tablet Take 1 tablet (50 mg total) by mouth every 6 (six) hours as needed for moderate pain. Post-operatively.  .  warfarin (COUMADIN) 3 MG tablet Take 1.5-3 mg by mouth daily. Takes 0.5 tablet (1.5 mg) on Wednesday and Saturday and Take 1 tablet (3 mg) on all other days.     Allergies: Allergies  Allergen Reactions  . Hctz [Hydrochlorothiazide]     Hyponatremia     Social History: The patient  reports that he quit smoking about 77 years ago. His smoking use included cigarettes. He has a 2.00 pack-year smoking history. He has never used smokeless tobacco. He reports that he does not drink alcohol  or use drugs.   Family History: The patient's family history includes Deep vein thrombosis in his sister; Heart disease in his mother; Rheum arthritis in his mother.   Review of Systems: Please see the history of present illness.   All other systems are reviewed and negative.   Physical Exam: VS:  BP 110/60   Pulse 61   Ht 5' 11"  (1.803 m)   Wt 220 lb (99.8 kg)   SpO2 95%   BMI 30.68 kg/m  .  BMI Body mass index is 30.68 kg/m.  Wt Readings from Last 3 Encounters:  09/11/18 220 lb (99.8 kg)  05/28/18 220 lb (99.8 kg)  01/29/18 220 lb (99.8 kg)   BP is 94/50 by me.   General: Pleasant. Elderly. Very HOH. Alert and in no acute distress.  HEENT: Normal.  Neck: Supple, no JVD, carotid bruits, or masses noted.  Cardiac: Heart tones are distant - no edema noted.  Respiratory:  Lungs are clear to auscultation bilaterally with normal work of breathing.  GI: Soft and nontender.  MS: No deformity or atrophy. Gait not tested - he is in a wheelchair.  Skin: Warm and dry. Color is normal.  Neuro:  Strength and sensation are intact and no gross focal deficits noted.  Psych: Alert, appropriate and with normal affect.   LABORATORY DATA:  EKG:  EKG is not ordered today.  Lab Results  Component Value Date   WBC 8.4 12/11/2017   HGB 11.8 (L) 12/11/2017   HCT 34.8 (L) 12/11/2017   PLT 187 12/11/2017   GLUCOSE 133 (H) 12/11/2017   ALT 13 (L) 10/06/2016   AST 18 10/06/2016   NA 137 12/11/2017   K  4.4 12/11/2017   CL 95 (L) 12/11/2017   CREATININE 1.95 (H) 12/11/2017   BUN 40 (H) 12/11/2017   CO2 26 12/11/2017   TSH 2.970 08/15/2013   INR 1.87 10/06/2016   HGBA1C 6.6 (H) 11/07/2012       BNP (last 3 results) No results for input(s): BNP in the last 8760 hours.  ProBNP (last 3 results) Recent Labs    12/11/17 1619  PROBNP 3,626*     Other Studies Reviewed Today:   Assessment/Plan:  1. Chronic diastolic HF- he has had noted a prior normal EF - He uses extra diuretic as needed. This is ok. Needs lab today.   2. PAF- on anticoagulation. Has PPM in place.   3. Underlying PPM-now doing remote monitoring    4. Chronic anticoagulation- monitored by his PCP.No problems reported. Needs surveillance labs today.   5. Bilateral arm pain - most likely angina - ok to use sl NGT as needed - refilled today - assured him he will "not get hooked on this". Would favor a conservative approach - we could use Imdur if needed.   6. HTN - BP low - too low for him - cutting ARB in half.   7.  Advanced age   39. COVID-19 Education: The signs and symptoms of COVID-19 were discussed with the patient and how to seek care for testing (follow up with PCP or arrange E-visit).  The importance of social distancing, staying at home, hand hygiene and wearing a mask when out in public were discussed today.  Current medicines are reviewed with the patient today.  The patient does not have concerns regarding medicines other than what has been noted above.  The following changes have been made:  See above.  Labs/ tests ordered today include:  No orders of the defined types were placed in this encounter.    Disposition:   FU with me in 3 months.   Patient is agreeable to this plan and will call if any problems develop in the interim.   SignedTruitt Merle, NP  09/11/2018 2:35 PM  [place] [address] [place], [place]  [address]  Phone: [phone] Fax: [phone]

## 2018-09-10 NOTE — Telephone Encounter (Signed)
New Message         COVID-19 Pre-Screening Questions:   In the past 7 to 10 days have you had a cough,  shortness of breath, headache, congestion, fever (100 or greater) body aches, chills, sore throat, or sudden loss of taste or sense of smell? NO  Have you been around anyone with known Covid 19. NO  Have you been around anyone who is awaiting Covid 19 test results in the past 7 to 10 days? NO  Have you been around anyone who has been exposed to Covid 19, or has mentioned symptoms of Covid 19 within the past 7 to 10 days? NO Pts assitant will be coming with him to his appt   If you have any concerns/questions about symptoms patients report during screening (either on the phone or at threshold). Contact the provider seeing the patient or DOD for further guidance.  If neither are available contact a member of the leadership team.

## 2018-09-11 ENCOUNTER — Ambulatory Visit (INDEPENDENT_AMBULATORY_CARE_PROVIDER_SITE_OTHER): Payer: Medicare Other | Admitting: Nurse Practitioner

## 2018-09-11 ENCOUNTER — Other Ambulatory Visit: Payer: Self-pay

## 2018-09-11 ENCOUNTER — Encounter: Payer: Self-pay | Admitting: Nurse Practitioner

## 2018-09-11 VITALS — BP 110/60 | HR 61 | Ht 71.0 in | Wt 220.0 lb

## 2018-09-11 DIAGNOSIS — I480 Paroxysmal atrial fibrillation: Secondary | ICD-10-CM | POA: Diagnosis not present

## 2018-09-11 DIAGNOSIS — Z950 Presence of cardiac pacemaker: Secondary | ICD-10-CM

## 2018-09-11 DIAGNOSIS — I5032 Chronic diastolic (congestive) heart failure: Secondary | ICD-10-CM | POA: Diagnosis not present

## 2018-09-11 DIAGNOSIS — Z79899 Other long term (current) drug therapy: Secondary | ICD-10-CM

## 2018-09-11 DIAGNOSIS — E039 Hypothyroidism, unspecified: Secondary | ICD-10-CM | POA: Diagnosis not present

## 2018-09-11 DIAGNOSIS — Z7901 Long term (current) use of anticoagulants: Secondary | ICD-10-CM

## 2018-09-11 MED ORDER — IRBESARTAN 150 MG PO TABS: 150.0000 mg | ORAL_TABLET | Freq: Every day | ORAL | 3 refills | Status: DC

## 2018-09-11 MED ORDER — NITROGLYCERIN 0.4 MG SL SUBL: 0.4000 mg | SUBLINGUAL_TABLET | SUBLINGUAL | 3 refills | Status: DC | PRN

## 2018-09-11 NOTE — Patient Instructions (Addendum)
After Visit Summary:  We will be checking the following labs today - BMET, TSH & CBC   Medication Instructions:    Continue with your current medicines. BUT  I am cutting the Avapro in half - 150 mg a day - I will send in a new RX for this  I refilled the NTG today   If you need a refill on your cardiac medications before your next appointment, please call your pharmacy.     Testing/Procedures To Be Arranged:  N/A  Follow-Up:   See me in about 3 months.     At [facility], you and your health needs are our priority.  As part of our continuing mission to provide you with exceptional heart care, we have created designated Provider Care Teams.  These Care Teams include your primary Cardiologist (physician) and Advanced Practice Providers (APPs -  Physician Assistants and Nurse Practitioners) who all work together to provide you with the care you need, when you need it.  Special Instructions:  . Stay safe, stay home, wash your hands for at least 20 seconds and wear a mask when out in public.  . It was good to talk with you today.  [name] Keep a check on your BP . Let me know if your arms start bothering you more  . Keep up the good work.    Call the [place] office at [phone] if you have any questions, problems or concerns.

## 2018-09-12 LAB — BASIC METABOLIC PANEL
BUN/Creatinine Ratio: 18 (ref 10–24)
BUN: 44 mg/dL — ABNORMAL HIGH (ref 10–36)
CO2: 26 mmol/L (ref 20–29)
Calcium: 9.4 mg/dL (ref 8.6–10.2)
Chloride: 90 mmol/L — ABNORMAL LOW (ref 96–106)
Creatinine, Ser: 2.39 mg/dL — ABNORMAL HIGH (ref 0.76–1.27)
GFR calc Af Amer: 26 mL/min/{1.73_m2} — ABNORMAL LOW (ref >59)
GFR calc non Af Amer: 23 mL/min/{1.73_m2} — ABNORMAL LOW (ref >59)
Glucose: 119 mg/dL — ABNORMAL HIGH (ref 65–99)
Potassium: 4.3 mmol/L (ref 3.5–5.2)
Sodium: 133 mmol/L — ABNORMAL LOW (ref 134–144)

## 2018-09-12 LAB — CBC
Hematocrit: 37 % — ABNORMAL LOW (ref 37.5–51.0)
Hemoglobin: 12.2 g/dL — ABNORMAL LOW (ref 13.0–17.7)
MCH: 31 pg (ref 26.6–33.0)
MCHC: 33 g/dL (ref 31.5–35.7)
MCV: 94 fL (ref 79–97)
Platelets: 196 10*3/uL (ref 150–450)
RBC: 3.94 x10E6/uL — ABNORMAL LOW (ref 4.14–5.80)
RDW: 11.9 % (ref 11.6–15.4)
WBC: 7.9 10*3/uL (ref 3.4–10.8)

## 2018-09-12 LAB — TSH: TSH: 4.75 u[IU]/mL — ABNORMAL HIGH (ref 0.450–4.500)

## 2018-09-16 ENCOUNTER — Telehealth: Payer: Self-pay | Admitting: Nurse Practitioner

## 2018-09-16 NOTE — Telephone Encounter (Signed)
New Message    Patient is returning call in reference to lab results. Please call. Patient can be reached at any number on his account.

## 2018-09-18 ENCOUNTER — Other Ambulatory Visit: Payer: Self-pay | Admitting: Nurse Practitioner

## 2018-09-18 MED ORDER — BISOPROLOL FUMARATE 10 MG PO TABS: 10.0000 mg | ORAL_TABLET | Freq: Two times a day (BID) | ORAL | 3 refills | Status: DC

## 2018-09-23 ENCOUNTER — Other Ambulatory Visit: Payer: Self-pay | Admitting: Nurse Practitioner

## 2018-09-23 ENCOUNTER — Telehealth: Payer: Self-pay | Admitting: Nurse Practitioner

## 2018-09-23 MED ORDER — ISOSORBIDE MONONITRATE ER 30 MG PO TB24: 30.0000 mg | ORAL_TABLET | Freq: Every day | ORAL | 11 refills | Status: DC

## 2018-09-23 NOTE — Telephone Encounter (Signed)
Pt c/o Shortness Of Breath: STAT if SOB developed within the last 24 hours or pt is noticeably SOB on the phone  1. Are you currently SOB (can you hear that pt is SOB on the phone)? Not now patient is asleep. They come and go  2. How long have you been experiencing SOB? 09/19/18  3. Are you SOB when sitting or when up moving around? Both sometimes.  4. Are you currently experiencing any other symptoms? No. Patient care giver would like for someone to call her back.

## 2018-09-23 NOTE — Telephone Encounter (Signed)
S/w [facility] pt's caregiver.  Stated pt is not looking good, is more lethargic, pt cannot sleep laying down due to SOB.  Stanton Kidney stated has no way of checking pt's o2 and has not had any  clinical experience. Pt is sleeping and when pt wakes up caregiver will check pt's BP and HR and call back.  Will speak to Cecille Rubin to advise and route to [place] via [employer].

## 2018-09-23 NOTE — Telephone Encounter (Signed)
Caregiver calling back with BP 165/93  HR 61.  Will send to Cecille Rubin to advise.

## 2018-09-23 NOTE — Telephone Encounter (Signed)
I have spoken with Mr. [name] - he notes more shortness of breath and swelling. Having more arm pain too and says he is using more sl NTG. He does not wish to go to the ER. He would rather stay at home. He took an extra 1/2 dose of Lasix yesterday.   Will add Imdur 30 mg a day. He needs to start this today. Cautioned about headache.  He will take an extra half of Lasix for the next 3 days as well.  He can continue to use sl NTG as needed.   We will touch base with him on Wednesday.   Burtis Junes, RN, [place] [address] [place], [place]  [phone]

## 2018-09-23 NOTE — Telephone Encounter (Signed)
S/w Stanton Kidney, pts sitter. Called pt to reiterate  where pt's new medication was sent by Cecille Rubin today,  Imdur and pt is to start this medication today.

## 2018-10-07 ENCOUNTER — Telehealth: Payer: Self-pay | Admitting: Nurse Practitioner

## 2018-10-07 DIAGNOSIS — I5032 Chronic diastolic (congestive) heart failure: Secondary | ICD-10-CM

## 2018-10-07 MED ORDER — FUROSEMIDE 80 MG PO TABS: ORAL_TABLET | ORAL | 1 refills | Status: DC

## 2018-10-07 NOTE — Telephone Encounter (Signed)
[facility] Visit Initial Request  Date of Request ([facility]):  October 07, 2018  Requesting Provider:  Servando Beard    Agency Requested:    [facility] Contact:  Eric Buff, NP [address] [place], [place] [address] Phone #:  [phone] Fax #:  [phone]  Patient Demographic Information: Name:  [name] Age:  85 y.o.   DOB:  [birth year]  MRN:  [id number]   Address:   [address] [place] [address]   Phone Numbers:   Home Phone [phone]  Mobile [phone]     Emergency Contact Information on File:   Contact Information    Name [name] [id number]  [phone]   [name] [id number] [phone]      The above family members may be contacted for information on this patient (review DPR on file):  Yes    Patient Clinical Information:  Primary Care Provider:  [name], MD  Primary Cardiologist:  No primary care provider on file.  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   Allergies: He is allergic to hctz [hydrochlorothiazide].   Medications: Current Outpatient Medications on File Prior to Visit  Medication Sig  . beta carotene w/minerals (OCUVITE) tablet Take 1 tablet by mouth at bedtime.  .  bisoprolol (ZEBETA) 10 MG tablet Take 1 tablet (10 mg total) by mouth 2 (two) times daily.  . budesonide-formoterol (SYMBICORT) 160-4.5 MCG/ACT inhaler Inhale 2 puffs into the lungs 2 (two) times daily.  . Calcium Carbonate-Vitamin D (CALCIUM 600 + D PO) Take 1 tablet by mouth daily.  . cholecalciferol (VITAMIN D) 1000 UNITS tablet Take 2,000 Units by mouth daily.  . finasteride (PROSCAR) 5 MG tablet Take 5 mg by mouth daily.   . furosemide (LASIX) 80 MG tablet TAKE 1 TABLET BY MOUTH TWO  TIMES DAILY  . hydrALAZINE (APRESOLINE) 50 MG tablet Take 50 mg by mouth 2 (two) times daily.  . irbesartan (AVAPRO) 150 MG tablet Take 1 tablet (150 mg total) by mouth daily.  . isosorbide mononitrate (IMDUR) 30 MG 24 hr tablet Take 1 tablet (30 mg total) by mouth daily.  [name] levothyroxine (SYNTHROID, LEVOTHROID) 75 MCG tablet Take 75 mcg by mouth daily before breakfast.  . Multiple Vitamin (MULTIVITAMIN WITH MINERALS) TABS tablet Take 1 tablet by mouth daily.  . nitroGLYCERIN (NITROSTAT) 0.4 MG SL tablet Place 1 tablet (0.4 mg total) under the tongue every 5 (five) minutes as needed for chest pain.  [name] omega-3 acid ethyl esters (LOVAZA) 1 G capsule Take 1 g by mouth every morning.  [name] omeprazole (PRILOSEC) 20 MG capsule Take 20 mg by mouth at bedtime.   . simethicone (GAS-X) 80 MG chewable tablet Chew 1 tablet (80 mg total) by mouth every 6 (six) hours as needed (bloating).  . temazepam (RESTORIL) 15 MG capsule Take 15 mg by mouth daily.  . traMADol (ULTRAM) 50 MG tablet Take 1 tablet (50 mg total) by mouth every 6 (six)  hours as needed for moderate pain. Post-operatively.  . warfarin (COUMADIN) 3 MG tablet Take 1.5-3 mg by mouth daily. Takes 0.5 tablet (1.5 mg) on Wednesday and Saturday and Take 1 tablet (3 mg) on all other days.   No current facility-administered medications on file prior to visit.      Social Hx: He  reports that he quit smoking about 77 years ago. His smoking use included cigarettes. He has  a 2.00 pack-year smoking history. He has never used smokeless tobacco. He reports that he does not drink alcohol or use drugs.    Diagnosis/Reason for Visit:   CHF follow up  Services Requested:  Vital Signs (BP, Pulse, O2, Weight)  Physical Exam  Labs:  BMET the week of August 17th  # of Visits Needed/Frequency per Week: 1  A copy of the office note will be faxed with this form. All labs ordered for this home visit have been released and the request was sent to [name] at [facility].

## 2018-10-07 NOTE — Telephone Encounter (Signed)
S/w pt is aware of Lori's recommendation's. Will increase lasix to one and one half tablet (120 mg) am and one tablet (80 mg ) pm, medication list updated. Pt will keep current dose of imdur to one tablet (30 mg) was advised with Cecille Rubin due to pt still having headaches. Pt will have [facility] come out next week to draw bmet.  This note will be routed to Glory Buff, NP and Meribeth Mattes.

## 2018-10-07 NOTE — Telephone Encounter (Signed)
Let's take his Lasix to 120 mg in the AM and 80 mg in the PM - every day.   Increase Imdur to 60 mg if tolerating ok.   We can send someone to check some labs next week - would need a BMET.   Burtis Junes, RN, [facility] [address] [place], [place]  [address] [phone]

## 2018-10-07 NOTE — Telephone Encounter (Signed)
New Message    Pt c/o Shortness Of Breath: STAT if SOB developed within the last 24 hours or pt is noticeably SOB on the phone  1. Are you currently SOB (can you hear that pt is SOB on the phone)? Yes   2. How long have you been experiencing SOB? Two weeks  3. Are you SOB when sitting or when up moving around? Both  4. Are you currently experiencing any other symptoms? No

## 2018-10-07 NOTE — Telephone Encounter (Signed)
I spoke with [facility].  She reports pt has been short of breath since July 17.  Occurs with rest and with exertion.  Pretty much all day long but does ease up at times.  Pt is uncomfortable due to shortness of breath.  Weight is up and down.  Last Monday weight was 228 lbs, yesterday was 217 lbs and today's weight is 221 lbs. Fingers swollen. It seems extra dose of Lasix for 3 days taken at end of July may have helped some at that time.  Imdur is helping chest/arm pain but pt still needs NTG at times.  Will forward to Truitt Merle, NP for review/recommendations.

## 2018-10-14 ENCOUNTER — Other Ambulatory Visit: Payer: Self-pay | Admitting: *Deleted

## 2018-10-14 ENCOUNTER — Encounter: Payer: Self-pay | Admitting: Nurse Practitioner

## 2018-10-14 MED ORDER — FUROSEMIDE 80 MG PO TABS: 80.0000 mg | ORAL_TABLET | Freq: Two times a day (BID) | ORAL | 1 refills | Status: DC

## 2018-10-15 ENCOUNTER — Telehealth: Payer: Self-pay | Admitting: *Deleted

## 2018-10-15 LAB — BASIC METABOLIC PANEL
BUN/Creatinine Ratio: 21 (ref 10–24)
BUN: 39 mg/dL — ABNORMAL HIGH (ref 10–36)
CO2: 27 mmol/L (ref 20–29)
Calcium: 9.8 mg/dL (ref 8.6–10.2)
Chloride: 94 mmol/L — ABNORMAL LOW (ref 96–106)
Creatinine, Ser: 1.83 mg/dL — ABNORMAL HIGH (ref 0.76–1.27)
GFR calc Af Amer: 36 mL/min/{1.73_m2} — ABNORMAL LOW (ref >59)
GFR calc non Af Amer: 31 mL/min/{1.73_m2} — ABNORMAL LOW (ref >59)
Glucose: 128 mg/dL — ABNORMAL HIGH (ref 65–99)
Potassium: 4 mmol/L (ref 3.5–5.2)
Sodium: 135 mmol/L (ref 134–144)

## 2018-10-15 NOTE — Progress Notes (Signed)
Please re-clarify with him that the Isosorbide is for every day - this will cause a headache - he can take this medicine at night but should improve the longer he stays on it.   Let's see him sometime next month if possible here in the office.   Burtis Junes, RN, [facility] [address] [place], [place]  [address] [phone]

## 2018-10-15 NOTE — Telephone Encounter (Signed)
Please see the addendum to Glory Buff, NP note from [place].  Pt will take imdur every day stated gets a headache from medication,  stated to take a night to avoid headache.  Pt stated need refill looks like pt has #11 refill.  Also made pt's appt on sept 21. Pt has October ov with Cecille Rubin also.  Will print pt's appt and mail to remind pt. Will send to [place] to [place].

## 2018-10-15 NOTE — Telephone Encounter (Signed)
Noted  

## 2018-11-14 NOTE — Progress Notes (Signed)
CARDIOLOGY OFFICE NOTE  Date:  11/18/2018    [name] Date of Birth: [birth year] Medical Record #[id number]  PCP:  Eric Redwood, MD  Cardiologist:  [name]   Chief Complaint  Patient presents with  . Follow-up    History of Present Illness: [name] is a 85 y.o. male who presents today for a 2 month check. Former patient of [name] - following with me.   He has a history of chronic diastolic HF, PAF- on Coumadin, HTN, CAD with coronary calcifications on prior CT and prior cath in 2018 showing no significant disease, MV regurgitation, obesity, HLD, chronic anticoagulation, underlying PPM (for second degree AV block in 2005 in [place]) and prior SVT with prior ablation in 1988 at [place]. Other issues include obesity, BPH, asthma, gout, hypothyroidism, & GERD.  Noted EF of 55% by echo from 10/2016.  He was seen last by [name] 2019. He hadhad progressive shortness of breath despite escalating doses of diuretics. He hadbeen caring for his wife who is on Hospice.I then met him in [place] assumed his care - his wife had passed. He was doing better clinically with a higher dose of Lasix. Felt to be doing ok. His PPM checkswerearranged to be followed here.I have followed him since - he has been able to hire someone who stays in his home 24/7 now - he has had issues with more shortness of breath and angina (typically manifested by bilateral arm pain). I last saw him in the office back in July - he was felt to be holding his own - he does use sl NTG and extra Lasix on occasion. BP was running low and we cut his ARB back.  Coumadin is checked by [name]. We have had several phone calls since - we have increased his long acting nitrate therapy along with diuretics for better symptom control.   The patient does not have symptoms concerning for COVID-19 infection (fever, chills, cough, or new shortness of breath).   Comes in today.  Here with his new aid. [name]. He is doing quite well. He notes his arm pain has improved considerably. Using much less sl NTG. Breathing is ok. He is quite happy with how he is doing and that his symptoms have improved. He is having labs with PCP later this week. They have been getting out of the house more - going out to eat in restaurants - cautioned about this. He has had a family member that has had COVID and did ok.   Past Medical History:  Diagnosis Date  . 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 Anticoagulant long-term use   . Asthma    until age 88   . Atrial fibrillation (H[facility])     on coumadin    . Atrioventricular block, complete ([place])   . BPH (benign prostatic hyperplasia)   . Chronic combined systolic and diastolic CHF (congestive heart failure) (H[facility])    EF 40% by ECHO  8/14  - followed by [name]  . Diplopia 08/14/2013  . Encounter for care or replacement of suprapubic tube ([place])   . GERD (gastroesophageal reflux disease)   . Gout   . HOH (hard of hearing)    hearing aids  . Hypertension   . Hyponatremia 2014   HCTZ  . Hypothyroid   . Lumbar disc disease   . Macular degeneration   . Obesity (BMI 30-39.9)   . Orchitis of right testicle   .  Osteoporosis   . Pacemaker-Medtronic 2005   generator change  . Sinusitis     Past Surgical History:  Procedure Laterality Date  . ABLATION  1990  . CATARACT EXTRACTION Bilateral   . CYSTOSCOPY N/A 12/11/2012   Procedure: CYSTOSCOPY;  Surgeon: [name], MD;  Location: [facility];  Service: Urology;  Laterality: N/A;  . [place]  . INSERTION OF SUPRAPUBIC CATHETER N/A 12/11/2012   Procedure: INSERTION OF SUPRAPUBIC CATHETER " NEEDS PERC BALLOON LIKE FOR PERC KIDNEY";  Surgeon: [name], MD;  Location: [facility];  Service: Urology;  Laterality: N/A;  . PACEMAKER GENERATOR CHANGE N/A 07/19/2011   Procedure: PACEMAKER GENERATOR CHANGE;  Surgeon: [name], MD;  Location: [facility] CATH LAB;  Service:  Cardiovascular;  Laterality: N/A;  . PACEMAKER INSERTION    . ROBOT ASSISTED LAPAROSCOPIC RADICAL PROSTATECTOMY N/A 02/10/2013   Procedure: ROBOTIC ASSISTED LAPAROSCOPIC SIMPLE PROSTATECTOMY;  Surgeon: [name], MD;  Location: [facility];  Service: Urology;  Laterality: N/A;  . SUPRPUBIC TUBE    . TRANSURETHRAL RESECTION OF PROSTATE       Medications: Current Meds  Medication Sig  . beta carotene w/minerals (OCUVITE) tablet Take 1 tablet by mouth at bedtime.  . bisoprolol (ZEBETA) 10 MG tablet Take 1 tablet (10 mg total) by mouth 2 (two) times daily.  . budesonide-formoterol (SYMBICORT) 160-4.5 MCG/ACT inhaler Inhale 2 puffs into the lungs 2 (two) times daily.  . Calcium Carbonate-Vitamin D (CALCIUM 600 + D PO) Take 1 tablet by mouth daily.  . cholecalciferol (VITAMIN D) 1000 UNITS tablet Take 2,000 Units by mouth daily.  . finasteride (PROSCAR) 5 MG tablet Take 5 mg by mouth daily.   . furosemide (LASIX) 80 MG tablet Take 1 tablet (80 mg total) by mouth 2 (two) times daily.  . hydrALAZINE (APRESOLINE) 50 MG tablet Take 50 mg by mouth 2 (two) times daily.  . irbesartan (AVAPRO) 150 MG tablet Take 1 tablet (150 mg total) by mouth daily.  . isosorbide mononitrate (IMDUR) 30 MG 24 hr tablet Take 1 tablet (30 mg total) by mouth daily.  [name] levothyroxine (SYNTHROID, LEVOTHROID) 75 MCG tablet Take 75 mcg by mouth daily before breakfast.  . Multiple Vitamin (MULTIVITAMIN WITH MINERALS) TABS tablet Take 1 tablet by mouth daily.  . nitroGLYCERIN (NITROSTAT) 0.4 MG SL tablet Place 1 tablet (0.4 mg total) under the tongue every 5 (five) minutes as needed for chest pain.  [name] omega-3 acid ethyl esters (LOVAZA) 1 G capsule Take 1 g by mouth every morning.  [name] omeprazole (PRILOSEC) 20 MG capsule Take 20 mg by mouth at bedtime.   . simethicone (GAS-X) 80 MG chewable tablet Chew 1 tablet (80 mg total) by mouth every 6 (six) hours as needed (bloating).  . temazepam (RESTORIL) 15 MG capsule Take 15 mg by  mouth daily.  . traMADol (ULTRAM) 50 MG tablet Take 1 tablet (50 mg total) by mouth every 6 (six) hours as needed for moderate pain. Post-operatively.  . warfarin (COUMADIN) 3 MG tablet Take 1.5-3 mg by mouth daily. Takes 0.5 tablet (1.5 mg) on Wednesday and Saturday and Take 1 tablet (3 mg) on all other days.     Allergies: Allergies  Allergen Reactions  . Hctz [Hydrochlorothiazide]     Hyponatremia     Social History: The patient  reports that he quit smoking about 77 years ago. His smoking use included cigarettes. He has a 2.00 pack-year smoking history. He has never used smokeless tobacco. He reports  that he does not drink alcohol or use drugs.   Family History: The patient's family history includes Deep vein thrombosis in his sister; Heart disease in his mother; Rheum arthritis in his mother.   Review of Systems: Please see the history of present illness.   All other systems are reviewed and negative.   Physical Exam: VS:  BP (!) 118/54 (BP Location: Left Arm, Patient Position: Sitting, Cuff Size: Large)   Beard 71   Ht _0  (1.753 m)   Wt 226 lb 12.8 oz (102.9 kg)   SpO2 98% Comment: AT REST  BMI 33.49 kg/m  .  BMI Body mass index is 33.49 kg/m.  Wt Readings from Last 3 Encounters:  11/18/18 226 lb 12.8 oz (102.9 kg)  09/11/18 220 lb (99.8 kg)  05/28/18 220 lb (99.8 kg)    General: Pleasant. He looks good today. He is alert and in no acute distress.   HEENT: Normal.  Neck: Supple, no JVD, carotid bruits, or masses noted.  Cardiac: Regular rate and rhythm. No murmurs, rubs, or gallops. No edema.  Respiratory:  Lungs are clear to auscultation bilaterally with normal work of breathing.  GI: Soft and nontender.  MS: No deformity or atrophy. Gait not tested.  Skin: Warm and dry. Color is normal.  Neuro:  Strength and sensation are intact and no gross focal deficits noted.  Psych: Alert, appropriate and with normal affect.   LABORATORY DATA:  EKG:  EKG is ordered  today. This demonstrates V paced rhythm.  Lab Results  Component Value Date   WBC 7.9 09/11/2018   HGB 12.2 (L) 09/11/2018   HCT 37.0 (L) 09/11/2018   PLT 196 09/11/2018   GLUCOSE 128 (H) 10/14/2018   ALT 13 (L) 10/06/2016   AST 18 10/06/2016   NA 135 10/14/2018   K 4.0 10/14/2018   CL 94 (L) 10/14/2018   CREATININE 1.83 (H) 10/14/2018   BUN 39 (H) 10/14/2018   CO2 27 10/14/2018   TSH 4.750 (H) 09/11/2018   INR 1.87 10/06/2016   HGBA1C 6.6 (H) 11/07/2012       BNP (last 3 results) No results for input(s): BNP in the last 8760 hours.  ProBNP (last 3 results) Recent Labs    12/11/17 1619  PROBNP 3,626*     Other Studies Reviewed Today:   Assessment/Plan:  1. Chronic diastolic HF - looks pretty compensated at this time. For upcoming labs this week with PCP.   2. PAF - has PPM in place - remains on anticoagulation.   3. Underlying PPM - does remote monitoring.   4. Chronic anticoagulation - monitored by his PCP.   5. Bilateral arm pain - assumed to be angina - now on Imdur - doing much better clinically. We have room to titrate as needed. For now, no changes made - he is much better.   6. HTN - BP ok today. No changes made today.   7. Advanced age - still does pretty well.   8. COVID-19 Education: The signs and symptoms of COVID-19 were discussed with the patient and how to seek care for testing (follow up with PCP or arrange E-visit).  The importance of social distancing, staying at home, hand hygiene and wearing a mask when out in public were discussed today.  Current medicines are reviewed with the patient today.  The patient does not have concerns regarding medicines other than what has been noted above.  The following changes have been made:  See above.  Labs/ tests ordered today include:    Orders Placed This Encounter  Procedures  . EKG 12-Lead     Disposition:   FU with me in 4 months.   Patient is agreeable to this plan and will call if  any problems develop in the interim.   SignedTruitt Merle, NP  11/18/2018 3:23 PM  [facility] [address] [place], [place]  [address] Phone: [phone] Fax: [phone]

## 2018-11-18 ENCOUNTER — Ambulatory Visit (INDEPENDENT_AMBULATORY_CARE_PROVIDER_SITE_OTHER): Payer: Medicare Other | Admitting: Nurse Practitioner

## 2018-11-18 ENCOUNTER — Other Ambulatory Visit: Payer: Self-pay

## 2018-11-18 ENCOUNTER — Encounter: Payer: Self-pay | Admitting: Nurse Practitioner

## 2018-11-18 VITALS — BP 118/54 | HR 71 | Ht 69.0 in | Wt 226.8 lb

## 2018-11-18 DIAGNOSIS — Z7901 Long term (current) use of anticoagulants: Secondary | ICD-10-CM

## 2018-11-18 DIAGNOSIS — E039 Hypothyroidism, unspecified: Secondary | ICD-10-CM

## 2018-11-18 DIAGNOSIS — I5032 Chronic diastolic (congestive) heart failure: Secondary | ICD-10-CM | POA: Diagnosis not present

## 2018-11-18 DIAGNOSIS — Z79899 Other long term (current) drug therapy: Secondary | ICD-10-CM

## 2018-11-18 DIAGNOSIS — R0789 Other chest pain: Secondary | ICD-10-CM

## 2018-11-18 DIAGNOSIS — R079 Chest pain, unspecified: Secondary | ICD-10-CM

## 2018-11-18 DIAGNOSIS — I480 Paroxysmal atrial fibrillation: Secondary | ICD-10-CM

## 2018-11-18 DIAGNOSIS — I259 Chronic ischemic heart disease, unspecified: Secondary | ICD-10-CM

## 2018-11-18 NOTE — Patient Instructions (Signed)
After Visit Summary:  We will be checking the following labs today - NONE   Medication Instructions:    Continue with your current medicines.    If you need a refill on your cardiac medications before your next appointment, please call your pharmacy.     Testing/Procedures To Be Arranged:  N/A  Follow-Up:   See me in 4 months    At [facility], you and your health needs are our priority.  As part of our continuing mission to provide you with exceptional heart care, we have created designated Provider Care Teams.  These Care Teams include your primary Cardiologist (physician) and Advanced Practice Providers (APPs -  Physician Assistants and Nurse Practitioners) who all work together to provide you with the care you need, when you need it.  Special Instructions:  . Stay safe, stay home, wash your hands for at least 20 seconds and wear a mask when out in public.  . It was good to talk with you today.    Call the [facility] office at [phone] if you have any questions, problems or concerns.       

## 2018-11-19 ENCOUNTER — Other Ambulatory Visit: Payer: Self-pay | Admitting: Nurse Practitioner

## 2018-11-26 ENCOUNTER — Ambulatory Visit (INDEPENDENT_AMBULATORY_CARE_PROVIDER_SITE_OTHER): Payer: Medicare Other | Admitting: *Deleted

## 2018-11-26 DIAGNOSIS — I480 Paroxysmal atrial fibrillation: Secondary | ICD-10-CM

## 2018-11-27 ENCOUNTER — Other Ambulatory Visit: Payer: Self-pay

## 2018-11-27 ENCOUNTER — Encounter (INDEPENDENT_AMBULATORY_CARE_PROVIDER_SITE_OTHER): Payer: Self-pay | Admitting: Ophthalmology

## 2018-11-27 ENCOUNTER — Encounter (INDEPENDENT_AMBULATORY_CARE_PROVIDER_SITE_OTHER): Payer: Medicare Other | Admitting: Ophthalmology

## 2018-11-27 DIAGNOSIS — I10 Essential (primary) hypertension: Secondary | ICD-10-CM

## 2018-11-27 DIAGNOSIS — H353112 Nonexudative age-related macular degeneration, right eye, intermediate dry stage: Secondary | ICD-10-CM

## 2018-11-27 DIAGNOSIS — H353221 Exudative age-related macular degeneration, left eye, with active choroidal neovascularization: Secondary | ICD-10-CM

## 2018-11-27 DIAGNOSIS — H35033 Hypertensive retinopathy, bilateral: Secondary | ICD-10-CM | POA: Diagnosis not present

## 2018-11-27 LAB — CUP PACEART REMOTE DEVICE CHECK
Battery Impedance: 846 Ohm
Battery Remaining Longevity: 62 mo
Battery Voltage: 2.77 V
Brady Statistic AP VP Percent: 81 %
Brady Statistic AP VS Percent: 0 %
Brady Statistic AS VP Percent: 19 %
Brady Statistic AS VS Percent: 0 %
Date Time Interrogation Session: 20200929142718
Implantable Lead Implant Date: 20050322
Implantable Lead Implant Date: 20050322
Implantable Lead Location: 753859
Implantable Lead Location: 753860
Implantable Lead Model: 4092
Implantable Lead Model: 5568
Implantable Pulse Generator Implant Date: 20130522
Lead Channel Impedance Value: 565 Ohm
Lead Channel Impedance Value: 634 Ohm
Lead Channel Pacing Threshold Amplitude: 0.625 V
Lead Channel Pacing Threshold Amplitude: 1.25 V
Lead Channel Pacing Threshold Pulse Width: 0.4 ms
Lead Channel Pacing Threshold Pulse Width: 0.4 ms
Lead Channel Setting Pacing Amplitude: 2.5 V
Lead Channel Setting Pacing Amplitude: 2.5 V
Lead Channel Setting Pacing Pulse Width: 0.4 ms
Lead Channel Setting Sensing Sensitivity: 2.8 mV

## 2018-12-06 ENCOUNTER — Encounter: Payer: Self-pay | Admitting: Cardiology

## 2018-12-06 NOTE — Progress Notes (Signed)
Remote pacemaker transmission.   

## 2018-12-09 ENCOUNTER — Ambulatory Visit: Payer: Medicare Other | Admitting: Nurse Practitioner

## 2018-12-26 ENCOUNTER — Other Ambulatory Visit: Payer: Self-pay

## 2018-12-26 ENCOUNTER — Encounter (INDEPENDENT_AMBULATORY_CARE_PROVIDER_SITE_OTHER): Payer: Medicare Other | Admitting: Ophthalmology

## 2018-12-26 DIAGNOSIS — H353112 Nonexudative age-related macular degeneration, right eye, intermediate dry stage: Secondary | ICD-10-CM | POA: Diagnosis not present

## 2018-12-26 DIAGNOSIS — H35033 Hypertensive retinopathy, bilateral: Secondary | ICD-10-CM | POA: Diagnosis not present

## 2018-12-26 DIAGNOSIS — H353221 Exudative age-related macular degeneration, left eye, with active choroidal neovascularization: Secondary | ICD-10-CM | POA: Diagnosis not present

## 2018-12-26 DIAGNOSIS — I10 Essential (primary) hypertension: Secondary | ICD-10-CM

## 2018-12-26 DIAGNOSIS — H43813 Vitreous degeneration, bilateral: Secondary | ICD-10-CM

## 2018-12-31 ENCOUNTER — Other Ambulatory Visit: Payer: Self-pay | Admitting: *Deleted

## 2018-12-31 ENCOUNTER — Telehealth: Payer: Self-pay | Admitting: *Deleted

## 2018-12-31 ENCOUNTER — Telehealth: Payer: Self-pay | Admitting: Nurse Practitioner

## 2018-12-31 DIAGNOSIS — Z79899 Other long term (current) drug therapy: Secondary | ICD-10-CM

## 2018-12-31 NOTE — Telephone Encounter (Signed)
S/w pt has been very weak the last 10 days and was worried about potassium and lab work.  Pt has not had any help at house due house help is sick.  Gave pt Lori's recommendation's which pt repeated back and pt will call back next week if not feeling better. Will send to [place] to [place].

## 2018-12-31 NOTE — Telephone Encounter (Signed)
I spoke to the patient who called because for the past 8-10 days, he has noticed a weight gain of 4 lbs, some SOB and swelling in his legs, along with his stomach.    He takes Lasix 80 mg bid, but has recently taken an extra 40 mg in the AM, (about 6 times in the past 10 days), as instructed.  He was told to update [place] after his OV on 9/21, so he decided to call.  Please advise, thank you.

## 2018-12-31 NOTE — Telephone Encounter (Signed)
[facility] Visit Initial Request  Date of Request ([place]):  December 31, 2018  Requesting Provider:  Truitt Merle, NP    Agency Requested:    [facility] Contact:  Glory Buff, NP [address] [place], [place] [address] Phone #:  [phone] Fax #:  [phone]  Patient Demographic Information: Name:  [name] Age:  85 y.o.   DOB:  [birth year]  MRN:  [id number]   Address:   [address] [place] [address]   Phone Numbers:   Home Phone [phone]  Mobile [phone]     Emergency Contact Information on File:   Contact Information    Name [place] Son [phone]  [phone]   Darel Hong [id number] [phone]      The above family members may be contacted for information on this patient (review DPR on file):  Yes Jancarlos Sorrento, Patients son [phone]   Patient Clinical Information:  Primary Care Provider:  [name], MD  Primary Cardiologist:  No primary care provider on file.  Primary Electrophysiologist:  None   Past Medical Hx: Mr. 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), BPH (benign prostatic hyperplasia), Chronic combined systolic and diastolic CHF (congestive heart failure) ([place]), Diplopia (08/14/2013), Encounter for care or replacement of suprapubic tube ([place]), GERD (gastroesophageal reflux disease), Gout, HOH (hard of hearing), Hypertension, Hyponatremia (2014), Hypothyroid, Lumbar disc disease, Macular degeneration, Obesity (BMI 30-39.9), Orchitis of right testicle, Osteoporosis, Pacemaker-Medtronic (2005), and Sinusitis.   Allergies: He is allergic to hctz [hydrochlorothiazide].   Medications: Current Outpatient Medications on File Prior to Visit  Medication Sig  . beta carotene w/minerals (OCUVITE)  tablet Take 1 tablet by mouth at bedtime.  . bisoprolol (ZEBETA) 10 MG tablet Take 1 tablet (10 mg total) by mouth 2 (two) times daily.  . budesonide-formoterol (SYMBICORT) 160-4.5 MCG/ACT inhaler Inhale 2 puffs into the lungs 2 (two) times daily.  . Calcium Carbonate-Vitamin D (CALCIUM 600 + D PO) Take 1 tablet by mouth daily.  . cholecalciferol (VITAMIN D) 1000 UNITS tablet Take 2,000 Units by mouth daily.  . finasteride (PROSCAR) 5 MG tablet Take 5 mg by mouth daily.   . furosemide (LASIX) 80 MG tablet TAKE 1 TABLET BY MOUTH  TWICE DAILY  . hydrALAZINE (APRESOLINE) 50 MG tablet Take 50 mg by mouth 2 (two) times daily.  . irbesartan (AVAPRO) 150 MG tablet Take 1 tablet (150 mg total) by mouth daily.  . isosorbide mononitrate (IMDUR) 30 MG 24 hr tablet Take 1 tablet (30 mg total) by mouth daily.  [name] levothyroxine (SYNTHROID, LEVOTHROID) 75 MCG tablet Take 75 mcg by mouth daily before breakfast.  . Multiple Vitamin (MULTIVITAMIN WITH MINERALS) TABS tablet Take 1 tablet by mouth daily.  . nitroGLYCERIN (NITROSTAT) 0.4 MG SL tablet Place 1 tablet (0.4 mg total) under the tongue every 5 (five) minutes as needed for chest pain.  [name] omega-3 acid ethyl esters (LOVAZA) 1 G capsule Take 1 g by mouth every morning.  [name] omeprazole (PRILOSEC) 20 MG capsule Take 20 mg by mouth at bedtime.   . simethicone (GAS-X) 80 MG chewable tablet Chew 1 tablet (80 mg total) by mouth every 6 (six) hours as needed (bloating).  . temazepam (RESTORIL) 15 MG capsule Take 15 mg by mouth daily.  . traMADol (ULTRAM) 50 MG tablet Take 1 tablet (50 mg total)  by mouth every 6 (six) hours as needed for moderate pain. Post-operatively.  . warfarin (COUMADIN) 3 MG tablet Take 1.5-3 mg by mouth daily. Takes 0.5 tablet (1.5 mg) on Wednesday and Saturday and Take 1 tablet (3 mg) on all other days.   No current facility-administered medications on file prior to visit.      Social Hx: He  reports that he quit smoking about 77 years ago.  His smoking use included cigarettes. He has a 2.00 pack-year smoking history. He has never used smokeless tobacco. He reports that he does not drink alcohol or use drugs.    Diagnosis/Reason for Visit:   Labs and Assessing Patient  Services Requested:  Vital Signs (BP, Pulse, O2, Weight)  Physical Exam  Labs:  bmet  Assess for Other Rehab/Nursing Needs  # of Visits Needed/Frequency per Week: 1 A copy of the office note will be faxed with this form. All labs ordered for this home visit have been released and the request was sent to [name] at [facility].

## 2018-12-31 NOTE — Telephone Encounter (Signed)
Stay on 120 mg in the AM and 80 mg in the pm for the next 7 days.   Cecille Rubin

## 2018-12-31 NOTE — Telephone Encounter (Signed)
Would he like for [facility] to come and see him and do labs?  Cecille Rubin

## 2018-12-31 NOTE — Telephone Encounter (Signed)
Pt c/o swelling: STAT is pt has developed SOB within 24 hours  1) How much weight have you gained and in what time span? 4 lbs  In 10 days  2) If swelling, where is the swelling located? Stomach and legs   3) Are you currently taking a fluid pill?  yes  4) Are you currently SOB? yes  5) Do you have a log of your daily weights (if so, list)?   6) Have you gained 3 pounds in a day or 5 pounds in a week? 4 lbs in 10 days  7) Have you traveled recently no

## 2018-12-31 NOTE — Telephone Encounter (Signed)
S/w pt is aware home health will be contacting pt to come out to house the next two days.   Pt is aware and agreeable.

## 2018-12-31 NOTE — Telephone Encounter (Signed)
lvm for pt to call back with Lori's recommendation's.

## 2018-12-31 NOTE — Telephone Encounter (Signed)
I will route this to triage as pt is c/o sob as well as weight gain (4 lb's x 10 days)

## 2019-01-03 ENCOUNTER — Encounter: Payer: Self-pay | Admitting: Nurse Practitioner

## 2019-01-03 NOTE — Progress Notes (Signed)
[name]        DOB: [birth year]  Purpose of Visit: on behalf of [facility] to assess HH needs, VS, exam, wt, BMET  Medications: Is the patient taking all medications listed on MAR from Epic? No, but it's been updated  Is the patient able to pick up medications? No but has a caregiver that can  Vitals: BP: 142/68    HR:  60   Oxygen: 96% RA Weight: 220lbs        Physical Exam:  Lung sounds: diminished/crackle, enc deep breaths  Heart sounds: regular  Peripheral edema: yes, slight  Wounds: no   Any patient concerns? He and his daughter are not happy with the current live-in caregiver and will be looking for a new one.    ReDS Vest/Clip Reading: n/a  Rhythm Strip: n/a  Is Home Health recommended? No, not needed.  He needs someone that prepares meals and provides companionship.   Vanita Ingles, RN 01/03/19  [name]'s daughter had come to stay w/him for the weekend from [facility].  There is a hired "caregiver" that lives upstairs that both [name] and his daughter stated is "not very good."  [name] denied feeling unsafe with the caregiver but did state that she wanted to help him put his meds in his pill box and forgot to put his lasix approx 10 days ago.  He stated he didn't get his lasix x 2-3 days and his weight had increased and did experience increased shob.  He stated he takes his weight Qday.    [name] does all his own ADLs, walks w/walker or cane in the house and uses wheelchair outside of home prn.  He wears his Life Alert aat.  [name]'s daughter said he needs help w/meal prep as well as companionship (to play cards with) but otherwise is able to manage his medications and self-care.    [name] has a housekeeper come 2x/month.  His live-in caregiver does take him to his appointments and she buys groceries.  [name]'s nephew is the one that "hires" the live-in caregivers.  They are wanting to find a different one to replace the current  lady.    [name] is [facility] and wears hearing aids.  He is UTD w/flu shot this season.  He has a pacemaker and c/o occasional shob and edema, though he had no shob during the visit and has slight swelling to BLE's.  He stated the swelling and shob was worse when his caregiver forgot to put his lasix in his pill box as indicated earlier.  [name] c/o chronic back pain but takes CBD "1 stopper-full (1500mg )" BID and states it helps him a lot.    BMET was drawn and brought to the [facility] on [address] [place].

## 2019-01-04 LAB — BASIC METABOLIC PANEL
BUN/Creatinine Ratio: 18 (ref 10–24)
BUN: 34 mg/dL (ref 10–36)
CO2: 27 mmol/L (ref 20–29)
Calcium: 9.4 mg/dL (ref 8.6–10.2)
Chloride: 91 mmol/L — ABNORMAL LOW (ref 96–106)
Creatinine, Ser: 1.89 mg/dL — ABNORMAL HIGH (ref 0.76–1.27)
GFR calc Af Amer: 34 mL/min/{1.73_m2} — ABNORMAL LOW (ref >59)
GFR calc non Af Amer: 30 mL/min/{1.73_m2} — ABNORMAL LOW (ref >59)
Glucose: 176 mg/dL — ABNORMAL HIGH (ref 65–99)
Potassium: 3.9 mmol/L (ref 3.5–5.2)
Sodium: 133 mmol/L — ABNORMAL LOW (ref 134–144)

## 2019-01-21 ENCOUNTER — Encounter (INDEPENDENT_AMBULATORY_CARE_PROVIDER_SITE_OTHER): Payer: Medicare Other | Admitting: Ophthalmology

## 2019-01-21 DIAGNOSIS — H353112 Nonexudative age-related macular degeneration, right eye, intermediate dry stage: Secondary | ICD-10-CM | POA: Diagnosis not present

## 2019-01-21 DIAGNOSIS — I10 Essential (primary) hypertension: Secondary | ICD-10-CM

## 2019-01-21 DIAGNOSIS — H35033 Hypertensive retinopathy, bilateral: Secondary | ICD-10-CM | POA: Diagnosis not present

## 2019-01-21 DIAGNOSIS — H353221 Exudative age-related macular degeneration, left eye, with active choroidal neovascularization: Secondary | ICD-10-CM | POA: Diagnosis not present

## 2019-01-21 DIAGNOSIS — H43813 Vitreous degeneration, bilateral: Secondary | ICD-10-CM

## 2019-02-18 ENCOUNTER — Encounter (INDEPENDENT_AMBULATORY_CARE_PROVIDER_SITE_OTHER): Payer: Medicare Other | Admitting: Ophthalmology

## 2019-02-18 ENCOUNTER — Other Ambulatory Visit: Payer: Self-pay

## 2019-02-18 DIAGNOSIS — H353221 Exudative age-related macular degeneration, left eye, with active choroidal neovascularization: Secondary | ICD-10-CM | POA: Diagnosis not present

## 2019-02-18 DIAGNOSIS — H353112 Nonexudative age-related macular degeneration, right eye, intermediate dry stage: Secondary | ICD-10-CM

## 2019-02-18 DIAGNOSIS — I10 Essential (primary) hypertension: Secondary | ICD-10-CM

## 2019-02-18 DIAGNOSIS — H35033 Hypertensive retinopathy, bilateral: Secondary | ICD-10-CM

## 2019-02-18 DIAGNOSIS — H43813 Vitreous degeneration, bilateral: Secondary | ICD-10-CM

## 2019-02-25 ENCOUNTER — Ambulatory Visit (INDEPENDENT_AMBULATORY_CARE_PROVIDER_SITE_OTHER): Payer: Medicare Other | Admitting: *Deleted

## 2019-02-25 DIAGNOSIS — Z950 Presence of cardiac pacemaker: Secondary | ICD-10-CM | POA: Diagnosis not present

## 2019-02-28 LAB — CUP PACEART REMOTE DEVICE CHECK
Battery Impedance: 950 Ohm
Battery Remaining Longevity: 58 mo
Battery Voltage: 2.78 V
Brady Statistic AP VP Percent: 77 %
Brady Statistic AP VS Percent: 0 %
Brady Statistic AS VP Percent: 22 %
Brady Statistic AS VS Percent: 0 %
Date Time Interrogation Session: 20201231164052
Implantable Lead Implant Date: 20050322
Implantable Lead Implant Date: 20050322
Implantable Lead Location: 753859
Implantable Lead Location: 753860
Implantable Lead Model: 4092
Implantable Lead Model: 5568
Implantable Pulse Generator Implant Date: 20130522
Lead Channel Impedance Value: 565 Ohm
Lead Channel Impedance Value: 643 Ohm
Lead Channel Pacing Threshold Amplitude: 0.625 V
Lead Channel Pacing Threshold Amplitude: 1.25 V
Lead Channel Pacing Threshold Pulse Width: 0.4 ms
Lead Channel Pacing Threshold Pulse Width: 0.4 ms
Lead Channel Setting Pacing Amplitude: 2.5 V
Lead Channel Setting Pacing Amplitude: 2.5 V
Lead Channel Setting Pacing Pulse Width: 0.4 ms
Lead Channel Setting Sensing Sensitivity: 2.8 mV

## 2019-02-28 NOTE — Progress Notes (Signed)
CARDIOLOGY OFFICE NOTE  Date:  03/05/2019    [name] Date of Birth: 1922/11/19 Medical Record #[id number]  PCP:  [name], MD  Cardiologist:  [name]     Chief Complaint  Patient presents with  . Follow-up    History of Present Illness: [name] is a 86 y.o. male who presents today for a follow up visit.  Former patient of [name] - now following with me.   He has a history of chronic diastolic HF, PAF- on Coumadin, HTN, CAD with coronary calcifications on prior CT and prior cath in 2018 showing no significant disease, MV regurgitation, obesity, HLD, chronic anticoagulation, underlying PPM (for second degree AV block in 2005 in [place]) and prior SVT with prior ablation in 1988 at [place]. Other issues include obesity, BPH, asthma, gout, hypothyroidism, & GERD.  Noted EF of 55% by echo from 10/2016.  He was last seen by [name] 2019. He hadhad progressive shortness of breath despite escalating doses of diuretics. He hadbeen caring for his wife who is on Hospice.I then met him in [place] assumed his care- his wife had passed. He was doing better clinically with a higher dose of Lasix. Felt to be doing ok. His PPM checkswerearranged to be followed here.I have followed him since - he has been able to hire someone who stays in his home 24/7 now - he has had issues with more shortness of breath and angina (typically manifested by bilateral arm pain). I last saw him in the office back in July - he was felt to be holding his own - he does use sl NTG and extra Lasix on occasion. BP was running low and we cut his ARB back.  Coumadin is checked by Dr. Brigitte Pulse. We have had several phone calls since - we have increased his long acting nitrate therapy along with diuretics for better symptom control.   I last saw him in September - he was doing ok - using less sl NTG. He was happy with how he was doing. We have sent [facility] out  for concerns for fluid overload and to help with his labs. He has had issues with getting adequate live in help.   The patient does not have symptoms concerning for COVID-19 infection (fever, chills, cough, or new shortness of breath).   Comes in today. Here with his daughter today International aid/development worker. He is doing ok. He has been going out and about against my recommendation. He is asking about the vaccine. He will have some occasional arm pain if he does too much in the way of activity. His breathing is ok. He will have days where his weight goes up - takes extra Lasix - and then improves. He feels like he is doing ok. He has new help at home. Eustaquio Maize thinks he is doing well. No problems with his coumadin.   Past Medical History:  Diagnosis Date  . 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 Anticoagulant long-term use   . Asthma    until age 64   . Atrial fibrillation (HCC)     on coumadin    . Atrioventricular block, complete ([place])   . BPH (benign prostatic hyperplasia)   . Chronic combined systolic and diastolic CHF (congestive heart failure) (HCC)    EF 40% by ECHO  8/14  - followed by [name]  . Diplopia 08/14/2013  . Encounter for care or replacement of suprapubic tube (  HCC)   . GERD (gastroesophageal reflux disease)   . Gout   . HOH (hard of hearing)    hearing aids  . Hypertension   . Hyponatremia 2014   HCTZ  . Hypothyroid   . Lumbar disc disease   . Macular degeneration   . Obesity (BMI 30-39.9)   . Orchitis of right testicle   . Osteoporosis   . Pacemaker-Medtronic 2005   generator change  . Sinusitis     Past Surgical History:  Procedure Laterality Date  . ABLATION  1990  . CATARACT EXTRACTION Bilateral   . CYSTOSCOPY N/A 12/11/2012   Procedure: CYSTOSCOPY;  Surgeon: [name], MD;  Location: [facility];  Service: Urology;  Laterality: N/A;  . [place]  . INSERTION OF SUPRAPUBIC CATHETER N/A 12/11/2012   Procedure: INSERTION OF SUPRAPUBIC  CATHETER " NEEDS PERC BALLOON LIKE FOR PERC KIDNEY";  Surgeon: [name], MD;  Location: [facility];  Service: Urology;  Laterality: N/A;  . PACEMAKER GENERATOR CHANGE N/A 07/19/2011   Procedure: PACEMAKER GENERATOR CHANGE;  Surgeon: [name], MD;  Location: [facility] CATH LAB;  Service: Cardiovascular;  Laterality: N/A;  . PACEMAKER INSERTION    . ROBOT ASSISTED LAPAROSCOPIC RADICAL PROSTATECTOMY N/A 02/10/2013   Procedure: ROBOTIC ASSISTED LAPAROSCOPIC SIMPLE PROSTATECTOMY;  Surgeon: [name], MD;  Location: [facility];  Service: Urology;  Laterality: N/A;  . SUPRPUBIC TUBE    . TRANSURETHRAL RESECTION OF PROSTATE       Medications: Current Meds  Medication Sig  . beta carotene w/minerals (OCUVITE) tablet Take 1 tablet by mouth at bedtime.  . bisoprolol (ZEBETA) 10 MG tablet Take 1 tablet (10 mg total) by mouth 2 (two) times daily.  . budesonide-formoterol (SYMBICORT) 160-4.5 MCG/ACT inhaler Inhale 2 puffs into the lungs 2 (two) times daily.  . Calcium Carbonate-Vitamin D (CALCIUM 600 + D PO) Take 1 tablet by mouth daily.  . cholecalciferol (VITAMIN D) 1000 UNITS tablet Take 2,000 Units by mouth daily.  . finasteride (PROSCAR) 5 MG tablet Take 5 mg by mouth daily.   . furosemide (LASIX) 80 MG tablet TAKE 1 TABLET BY MOUTH  TWICE DAILY  . hydrALAZINE (APRESOLINE) 50 MG tablet Take 50 mg by mouth 2 (two) times daily.  . irbesartan (AVAPRO) 150 MG tablet Take 1 tablet (150 mg total) by mouth daily.  . isosorbide mononitrate (IMDUR) 30 MG 24 hr tablet Take 1 tablet (30 mg total) by mouth daily.  [name] levothyroxine (SYNTHROID, LEVOTHROID) 75 MCG tablet Take 75 mcg by mouth daily before breakfast.  . Multiple Vitamin (MULTIVITAMIN WITH MINERALS) TABS tablet Take 1 tablet by mouth daily.  . nitroGLYCERIN (NITROSTAT) 0.4 MG SL tablet PLACE 1 TABLET UNDER THE TONGUE EVERY 5 MINUTES AS NEEDED FOR CHEST PAIN  . omega-3 acid ethyl esters (LOVAZA) 1 G capsule Take 1 g by mouth every morning.  [name]  omeprazole (PRILOSEC) 20 MG capsule Take 20 mg by mouth at bedtime.   . simethicone (GAS-X) 80 MG chewable tablet Chew 1 tablet (80 mg total) by mouth every 6 (six) hours as needed (bloating).  . temazepam (RESTORIL) 15 MG capsule Take 15 mg by mouth daily.  . traMADol (ULTRAM) 50 MG tablet Take 1 tablet (50 mg total) by mouth every 6 (six) hours as needed for moderate pain. Post-operatively.  . warfarin (COUMADIN) 3 MG tablet Take 1.5-3 mg by mouth daily. Takes 0.5 tablet (1.5 mg) on Wednesday and Saturday and Take 1 tablet (3 mg) on all other  days.     Allergies: Allergies  Allergen Reactions  . Hctz [Hydrochlorothiazide]     Hyponatremia     Social History: The patient  reports that he quit smoking about 77 years ago. His smoking use included cigarettes. He has a 2.00 pack-year smoking history. He has never used smokeless tobacco. He reports that he does not drink alcohol or use drugs.   Family History: The patient's family history includes Deep vein thrombosis in his sister; Heart disease in his mother; Rheum arthritis in his mother.   Review of Systems: Please see the history of present illness.   All other systems are reviewed and negative.   Physical Exam: VS:  BP 122/60   Pulse 60   Ht _0  (1.803 m)   SpO2 94%   BMI 30.68 kg/m  .  BMI Body mass index is 30.68 kg/m.  Wt Readings from Last 3 Encounters:  01/03/19 220 lb (99.8 kg)  11/18/18 226 lb 12.8 oz (102.9 kg)  09/11/18 220 lb (99.8 kg)    General: Pleasant. Alert and in no acute distress. Weight is down from last time. He is in a wheelchair.   HEENT: Normal.  Neck: Supple, no JVD, carotid bruits, or masses noted.  Cardiac: Irregular rhythm. Rate is ok. Heart tones are distant. Legs remain full. No significant edema.  Respiratory:  Lungs are clear to auscultation bilaterally with normal work of breathing.  GI: Soft and nontender.  MS: No deformity or atrophy. Gait not tested.  Skin: Warm and dry. Color is  normal.  Neuro:  Strength and sensation are intact and no gross focal deficits noted.  Psych: Alert, appropriate and with normal affect.   LABORATORY DATA:  EKG:  EKG is not ordered today.  Lab Results  Component Value Date   WBC 7.9 09/11/2018   HGB 12.2 (L) 09/11/2018   HCT 37.0 (L) 09/11/2018   PLT 196 09/11/2018   GLUCOSE 176 (H) 01/03/2019   ALT 13 (L) 10/06/2016   AST 18 10/06/2016   NA 133 (L) 01/03/2019   K 3.9 01/03/2019   CL 91 (L) 01/03/2019   CREATININE 1.89 (H) 01/03/2019   BUN 34 01/03/2019   CO2 27 01/03/2019   TSH 4.750 (H) 09/11/2018   INR 1.87 10/06/2016   HGBA1C 6.6 (H) 11/07/2012        BNP (last 3 results) No results for input(s): BNP in the last 8760 hours.  ProBNP (last 3 results) No results for input(s): PROBNP in the last 8760 hours.   Other Studies Reviewed Today:   Assessment/Plan:  1. Chronic diastolic HF - looks pretty good today. [place] lab today. He will continue to use extra Lasix as needed.   2. Persistent AF - has PPM in place - remains on anticoagulation. Now 100% burden with controlled VR noted by remote monitoring.   3. Underlying PPM - does remote monitoring. Last check from 01/2019 noted.   4. Chronic anticoagulation - monitored by his PCP. Needs surveillance lab today. Checking CBC  5. Bilateral arm pain - seems fairly stable. Will leave him on long acting Imdur and he has sl NTG on hand that he will use as needed.   6. HTN - BP looks good - no changes made today.   7. Advanced age - still doing pretty well.   8. COVID-19 Education: The signs and symptoms of COVID-19 were discussed with the patient and how to seek care for testing (follow up with PCP or arrange E-visit).  The importance of social distancing, staying at home, hand hygiene and wearing a mask when out in public were discussed today. I strongly encouraged him to stay home - especially now. Instructions for how to obtain vaccine for those over 75  given to him today.   Current medicines are reviewed with the patient today.  The patient does not have concerns regarding medicines other than what has been noted above.  The following changes have been made:  See above.  Labs/ tests ordered today include:    Orders Placed This Encounter  Procedures  . Basic metabolic panel  . CBC no Diff     Disposition:   FU with me in 3 months.   Patient is agreeable to this plan and will call if any problems develop in the interim.   SignedTruitt Merle, NP  03/05/2019 2:03 PM  [facility] [address] [place], [place]  [address] Phone: [phone] Fax: [phone]

## 2019-03-04 ENCOUNTER — Other Ambulatory Visit: Payer: Self-pay | Admitting: Nurse Practitioner

## 2019-03-05 ENCOUNTER — Other Ambulatory Visit: Payer: Self-pay

## 2019-03-05 ENCOUNTER — Encounter: Payer: Self-pay | Admitting: Nurse Practitioner

## 2019-03-05 ENCOUNTER — Ambulatory Visit: Payer: Medicare Other | Admitting: Nurse Practitioner

## 2019-03-05 VITALS — BP 122/60 | HR 60 | Ht 71.0 in

## 2019-03-05 DIAGNOSIS — Z79899 Other long term (current) drug therapy: Secondary | ICD-10-CM | POA: Diagnosis not present

## 2019-03-05 DIAGNOSIS — I259 Chronic ischemic heart disease, unspecified: Secondary | ICD-10-CM | POA: Diagnosis not present

## 2019-03-05 DIAGNOSIS — I4819 Other persistent atrial fibrillation: Secondary | ICD-10-CM

## 2019-03-05 DIAGNOSIS — Z7901 Long term (current) use of anticoagulants: Secondary | ICD-10-CM | POA: Diagnosis not present

## 2019-03-05 DIAGNOSIS — I5032 Chronic diastolic (congestive) heart failure: Secondary | ICD-10-CM | POA: Diagnosis not present

## 2019-03-05 NOTE — Patient Instructions (Addendum)
After Visit Summary:  We will be checking the following labs today - BMET & CBC   Medication Instructions:    Continue with your current medicines.    If you need a refill on your cardiac medications before your next appointment, please call your pharmacy.     Testing/Procedures To Be Arranged:  N/A  Follow-Up:   See me in about 3 months.     At [facility], you and your health needs are our priority.  As part of our continuing mission to provide you with exceptional heart care, we have created designated Provider Care Teams.  These Care Teams include your primary Cardiologist (physician) and Advanced Practice Providers (APPs -  Physician Assistants and Nurse Practitioners) who all work together to provide you with the care you need, when you need it.  Special Instructions:  . Stay safe, stay home, wash your hands for at least 20 seconds and wear a mask when out in public.  . It was good to talk with you today.    Call the [facility] office at [phone] if you have any questions, problems or concerns

## 2019-03-06 LAB — CBC
Hematocrit: 37.6 % (ref 37.5–51.0)
Hemoglobin: 12.3 g/dL — ABNORMAL LOW (ref 13.0–17.7)
MCH: 30.7 pg (ref 26.6–33.0)
MCHC: 32.7 g/dL (ref 31.5–35.7)
MCV: 94 fL (ref 79–97)
Platelets: 185 10*3/uL (ref 150–450)
RBC: 4.01 x10E6/uL — ABNORMAL LOW (ref 4.14–5.80)
RDW: 12 % (ref 11.6–15.4)
WBC: 8.2 10*3/uL (ref 3.4–10.8)

## 2019-03-06 LAB — BASIC METABOLIC PANEL
BUN/Creatinine Ratio: 17 (ref 10–24)
BUN: 34 mg/dL (ref 10–36)
CO2: 27 mmol/L (ref 20–29)
Calcium: 9.5 mg/dL (ref 8.6–10.2)
Chloride: 95 mmol/L — ABNORMAL LOW (ref 96–106)
Creatinine, Ser: 1.96 mg/dL — ABNORMAL HIGH (ref 0.76–1.27)
GFR calc Af Amer: 33 mL/min/{1.73_m2} — ABNORMAL LOW (ref >59)
GFR calc non Af Amer: 28 mL/min/{1.73_m2} — ABNORMAL LOW (ref >59)
Glucose: 179 mg/dL — ABNORMAL HIGH (ref 65–99)
Potassium: 3.9 mmol/L (ref 3.5–5.2)
Sodium: 138 mmol/L (ref 134–144)

## 2019-03-20 ENCOUNTER — Encounter (INDEPENDENT_AMBULATORY_CARE_PROVIDER_SITE_OTHER): Payer: Medicare Other | Admitting: Ophthalmology

## 2019-03-31 ENCOUNTER — Encounter (INDEPENDENT_AMBULATORY_CARE_PROVIDER_SITE_OTHER): Payer: Medicare Other | Admitting: Ophthalmology

## 2019-03-31 DIAGNOSIS — H353112 Nonexudative age-related macular degeneration, right eye, intermediate dry stage: Secondary | ICD-10-CM

## 2019-03-31 DIAGNOSIS — H34832 Tributary (branch) retinal vein occlusion, left eye, with macular edema: Secondary | ICD-10-CM

## 2019-03-31 DIAGNOSIS — H35033 Hypertensive retinopathy, bilateral: Secondary | ICD-10-CM

## 2019-03-31 DIAGNOSIS — H43813 Vitreous degeneration, bilateral: Secondary | ICD-10-CM

## 2019-03-31 DIAGNOSIS — I10 Essential (primary) hypertension: Secondary | ICD-10-CM

## 2019-03-31 DIAGNOSIS — H353221 Exudative age-related macular degeneration, left eye, with active choroidal neovascularization: Secondary | ICD-10-CM

## 2019-04-15 ENCOUNTER — Other Ambulatory Visit: Payer: Self-pay | Admitting: Nurse Practitioner

## 2019-04-28 ENCOUNTER — Encounter (INDEPENDENT_AMBULATORY_CARE_PROVIDER_SITE_OTHER): Payer: Medicare Other | Admitting: Ophthalmology

## 2019-05-27 ENCOUNTER — Ambulatory Visit (INDEPENDENT_AMBULATORY_CARE_PROVIDER_SITE_OTHER): Payer: Medicare Other | Admitting: *Deleted

## 2019-05-27 ENCOUNTER — Telehealth: Payer: Self-pay | Admitting: Nurse Practitioner

## 2019-05-27 DIAGNOSIS — Z950 Presence of cardiac pacemaker: Secondary | ICD-10-CM | POA: Diagnosis not present

## 2019-05-27 LAB — CUP PACEART REMOTE DEVICE CHECK
Battery Impedance: 1002 Ohm
Battery Remaining Longevity: 57 mo
Battery Voltage: 2.76 V
Brady Statistic AP VP Percent: 74 %
Brady Statistic AP VS Percent: 0 %
Brady Statistic AS VP Percent: 26 %
Brady Statistic AS VS Percent: 0 %
Date Time Interrogation Session: 20210330113059
Implantable Lead Implant Date: 20050322
Implantable Lead Implant Date: 20050322
Implantable Lead Location: 753859
Implantable Lead Location: 753860
Implantable Lead Model: 4092
Implantable Lead Model: 5568
Implantable Pulse Generator Implant Date: 20130522
Lead Channel Impedance Value: 574 Ohm
Lead Channel Impedance Value: 626 Ohm
Lead Channel Pacing Threshold Amplitude: 0.5 V
Lead Channel Pacing Threshold Amplitude: 1.25 V
Lead Channel Pacing Threshold Pulse Width: 0.4 ms
Lead Channel Pacing Threshold Pulse Width: 0.4 ms
Lead Channel Setting Pacing Amplitude: 2.5 V
Lead Channel Setting Pacing Amplitude: 2.5 V
Lead Channel Setting Pacing Pulse Width: 0.4 ms
Lead Channel Setting Sensing Sensitivity: 2.8 mV

## 2019-05-27 MED ORDER — METOLAZONE 2.5 MG PO TABS: ORAL_TABLET | ORAL | 0 refills | Status: DC

## 2019-05-27 MED ORDER — POTASSIUM CHLORIDE CRYS ER 20 MEQ PO TBCR: 40.0000 meq | EXTENDED_RELEASE_TABLET | Freq: Every day | ORAL | 0 refills | Status: DC

## 2019-05-27 NOTE — Progress Notes (Signed)
PPM Remote  

## 2019-05-27 NOTE — Telephone Encounter (Signed)
Pt c/o Shortness Of Breath: STAT if SOB developed within the last 24 hours or pt is noticeably SOB on the phone  1. Are you currently SOB (can you hear that pt is SOB on the phone)? Yes currently SOB  2. How long have you been experiencing SOB? Started this morning  3. Are you SOB when sitting or when up moving around? Both   4. Are you currently experiencing any other symptoms? No appetite - started new medications for gout.

## 2019-05-27 NOTE — Telephone Encounter (Signed)
Spoke with live in nurse [facility] as well as [name]. Pt with CHF taking 80 mg lasix BID, takes extra 1/2 tablet as needed for increased fluid as he was instructed in the past. Weight is up 8 pounds      Since yesterday he is 230.  Usually 220-222. Nurse reports his RR is 30 and he is anxious. On the phone pt reports abdomen feels bloated.  He denies SOB currently.  Does not sound in any distress on the phone.  Concerned about his weight.  Pulse ox 92-93%. Asking to talk to [place].    The nurse updates that he has Gout - started colchicine Saturday am.  Took 2 dose one hour apart.  Has had significantly decreased urine output.  NO improvement in gout. Increased dose to TID as instructed since no improvement in gout symptoms,  started steroids this am.  She is concerned this is contributing to weight increase/decreased fluid output.   States "they are trying to keep him out of the hospital because he is high risk".  I spoke with Truitt Merle, NP. Orders received to start metolazone 2.5 mg x 3 days, 30 min prior to AM dose of Lasix.  No extra lasix at this time.  He asked me give all of this information to the home nurse.  He does not monitor sodium.  She is cooking for him at home.   Aware of follow up scheduled for 06/03/19 with Cecille Rubin.

## 2019-05-27 NOTE — Telephone Encounter (Signed)
Let's also add 40 meq of potassium for just 3 days - with the Zaroxolyn.  I will plan to get labs at his visit next week.   Thanks.   Cecille Rubin

## 2019-05-27 NOTE — Telephone Encounter (Signed)
Spoke with home health nurse and informed.  Sent prescription to pharmacy and called them to ask to get it ready ASAP.  She is aware to have him take 2 tablets today, and then 2 Wed, 2 Thurs and labs will be checked when he comes on Tuesday.

## 2019-05-28 NOTE — Progress Notes (Signed)
CARDIOLOGY OFFICE NOTE  Date:  06/03/2019    [name] Date of Birth: 1922-11-30 Medical Record #[id number]  PCP:  [name], MD  Cardiologist:  [name]   Chief Complaint  Patient presents with  . Follow-up    History of Present Illness: [name] is a 86 y.o. male who presents today for a follow up visit. Former patient of [name] - now following with me.  He has a history of chronic diastolic HF, PAF- on Coumadin, HTN, CAD with coronary calcifications on prior CT and prior cath in 2018 showing no significant disease, MV regurgitation, obesity, HLD, chronic anticoagulation, underlying PPM (for second degree AV block in 2005 in [place]) and prior SVT with prior ablation in 1988 at [place]. Other issues include obesity, BPH, asthma, gout, hypothyroidism, & GERD.  Noted EF of 55% by echo from 10/2016.  He was last seen by [name] 2019. He hadhad progressive shortness of breath despite escalating doses of diuretics. He hadbeen caring for his wife who is on Hospice.I then met him in [place] assumed his care- his wife had passed. He was doing better clinically with a higher dose of Lasix. Felt to be doing ok. His PPM checkswerearranged to be followed here.I have followed him since -he has been able to hire someone who stays in his home 24/7 now - he has had issues with more shortness of breath and angina (typically manifested by bilateral arm pain). I last saw him in the office back in July - he was felt to be holding his own - he does use sl NTG and extra Lasix on occasion. BP was running low and we cut his ARB back. Coumadin is checked by Dr. Brigitte Pulse.  We have had to intermittently increase his diuretics and nitrate therapy for better symptom control.   Last seen in January - was doing ok. Had been going out and about despite the pandemic. Overall felt to be doing ok from our standpoint.   Called last week with increasing  weight - using extra Lasix with no real response.  Zaroxolyn was added for 3 days only. He was to keep this appointment for follow up.   The patient does not have symptoms concerning for COVID-19 infection (fever, chills, cough, or new shortness of breath).   Comes in today. Here with his current caregiver - Deb. He is in a wheelchair. He was feeling pretty bad last week - thought he was going to die - now "feels great". Swelling is gone. Breathing is now fine. He notes the Zaroxolyn really made a difference for him. He still goes out and eats - very frequently. He is not restricting salt and his sugar. He is not going to. He is actually still driving some. Deb notes that he is forgetful - drives 25 mph on the highway. He has had several near falls. He has stable NTG use - he will use this if his arms are hurting.  He has had his COVID vaccine.   Past Medical History:  Diagnosis Date  . 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 Anticoagulant long-term use   . Asthma    until age 71   . Atrial fibrillation (HCC)     on coumadin    . Atrioventricular block, complete ([place])   . BPH (benign prostatic hyperplasia)   . Chronic combined systolic and diastolic CHF (congestive heart failure) (HCC)    EF 40% by ECHO  8/14  - followed by [name]  . Diplopia 08/14/2013  . Encounter for care or replacement of suprapubic tube ([place])   . GERD (gastroesophageal reflux disease)   . Gout   . HOH (hard of hearing)    hearing aids  . Hypertension   . Hyponatremia 2014   HCTZ  . Hypothyroid   . Lumbar disc disease   . Macular degeneration   . Obesity (BMI 30-39.9)   . Orchitis of right testicle   . Osteoporosis   . Pacemaker-Medtronic 2005   generator change  . Sinusitis     Past Surgical History:  Procedure Laterality Date  . ABLATION  1990  . CATARACT EXTRACTION Bilateral   . CYSTOSCOPY N/A 12/11/2012   Procedure: CYSTOSCOPY;  Surgeon: [name], MD;  Location: [facility];  Service:  Urology;  Laterality: N/A;  . [place]  . INSERTION OF SUPRAPUBIC CATHETER N/A 12/11/2012   Procedure: INSERTION OF SUPRAPUBIC CATHETER " NEEDS PERC BALLOON LIKE FOR PERC KIDNEY";  Surgeon: [name], MD;  Location: [facility];  Service: Urology;  Laterality: N/A;  . PACEMAKER GENERATOR CHANGE N/A 07/19/2011   Procedure: PACEMAKER GENERATOR CHANGE;  Surgeon: [name], MD;  Location: [facility] CATH LAB;  Service: Cardiovascular;  Laterality: N/A;  . PACEMAKER INSERTION    . ROBOT ASSISTED LAPAROSCOPIC RADICAL PROSTATECTOMY N/A 02/10/2013   Procedure: ROBOTIC ASSISTED LAPAROSCOPIC SIMPLE PROSTATECTOMY;  Surgeon: [name], MD;  Location: [facility];  Service: Urology;  Laterality: N/A;  . SUPRPUBIC TUBE    . TRANSURETHRAL RESECTION OF PROSTATE       Medications: Current Meds  Medication Sig  . beta carotene w/minerals (OCUVITE) tablet Take 1 tablet by mouth at bedtime.  . bisoprolol (ZEBETA) 10 MG tablet Take 1 tablet (10 mg total) by mouth 2 (two) times daily.  . budesonide-formoterol (SYMBICORT) 160-4.5 MCG/ACT inhaler Inhale 2 puffs into the lungs 2 (two) times daily.  . Calcium Carbonate-Vitamin D (CALCIUM 600 + D PO) Take 1 tablet by mouth daily.  . cholecalciferol (VITAMIN D) 1000 UNITS tablet Take 2,000 Units by mouth daily.  . Colchicine 0.6 MG CAPS Take 1 capsule by mouth as needed.  . finasteride (PROSCAR) 5 MG tablet Take 5 mg by mouth daily.   . furosemide (LASIX) 80 MG tablet TAKE 1 TABLET BY MOUTH  TWICE DAILY  . hydrALAZINE (APRESOLINE) 50 MG tablet Take 50 mg by mouth 2 (two) times daily.  . irbesartan (AVAPRO) 150 MG tablet Take 1 tablet (150 mg total) by mouth daily.  . isosorbide mononitrate (IMDUR) 30 MG 24 hr tablet Take 1 tablet (30 mg total) by mouth daily.  [name] levothyroxine (SYNTHROID, LEVOTHROID) 75 MCG tablet Take 75 mcg by mouth daily before breakfast.  . Multiple Vitamin (MULTIVITAMIN WITH MINERALS) TABS tablet Take 1 tablet by mouth daily.    . nitroGLYCERIN (NITROSTAT) 0.4 MG SL tablet PLACE 1 TABLET UNDER THE TONGUE EVERY 5 MINUTES AS NEEDED FOR CHEST PAIN  . omega-3 acid ethyl esters (LOVAZA) 1 G capsule Take 1 g by mouth every morning.  [name] omeprazole (PRILOSEC) 20 MG capsule Take 20 mg by mouth at bedtime.   . simethicone (GAS-X) 80 MG chewable tablet Chew 1 tablet (80 mg total) by mouth every 6 (six) hours as needed (bloating).  . temazepam (RESTORIL) 15 MG capsule Take 15 mg by mouth daily.  . traMADol (ULTRAM) 50 MG tablet Take 1 tablet (50 mg total) by mouth every 6 (six) hours as needed  for moderate pain. Post-operatively.  . warfarin (COUMADIN) 3 MG tablet Take 1.5-3 mg by mouth daily. Takes 0.5 tablet (1.5 mg) on Wednesday and Saturday and Take 1 tablet (3 mg) on all other days.     Allergies: Allergies  Allergen Reactions  . Hctz [Hydrochlorothiazide]     Hyponatremia     Social History: The patient  reports that he quit smoking about 77 years ago. His smoking use included cigarettes. He has a 2.00 pack-year smoking history. He has never used smokeless tobacco. He reports that he does not drink alcohol or use drugs.   Family History: The patient's family history includes Deep vein thrombosis in his sister; Heart disease in his mother; Rheum arthritis in his mother.   Review of Systems: Please see the history of present illness.   All other systems are reviewed and negative.   Physical Exam: VS:  BP (!) 102/54   Pulse 60   Ht 5' 11" (1.803 m)   Wt 225 lb (102.1 kg)   BMI 31.38 kg/m  .  BMI Body mass index is 31.38 kg/m.  Wt Readings from Last 3 Encounters:  06/03/19 225 lb (102.1 kg)  01/03/19 220 lb (99.8 kg)  11/18/18 226 lb 12.8 oz (102.9 kg)    General: Pleasant. Elderly. He is alert and in no acute distress. He is hard of hearing.   Cardiac: Heart tones are distant. No edema.  Respiratory:  Lungs are clear to auscultation bilaterally with normal work of breathing.  GI: Soft and nontender.   MS: No deformity or atrophy. Gait not tested.  Skin: Warm and dry. Color is normal.  Neuro:  Strength and sensation are intact and no gross focal deficits noted.  Psych: Alert, appropriate and with normal affect.   LABORATORY DATA:  EKG:  EKG is not ordered today.    Lab Results  Component Value Date   WBC 8.2 03/05/2019   HGB 12.3 (L) 03/05/2019   HCT 37.6 03/05/2019   PLT 185 03/05/2019   GLUCOSE 179 (H) 03/05/2019   ALT 13 (L) 10/06/2016   AST 18 10/06/2016   NA 138 03/05/2019   K 3.9 03/05/2019   CL 95 (L) 03/05/2019   CREATININE 1.96 (H) 03/05/2019   BUN 34 03/05/2019   CO2 27 03/05/2019   TSH 4.750 (H) 09/11/2018   INR 1.87 10/06/2016   HGBA1C 6.6 (H) 11/07/2012       BNP (last 3 results) No results for input(s): BNP in the last 8760 hours.  ProBNP (last 3 results) No results for input(s): PROBNP in the last 8760 hours.   Other Studies Reviewed Today:   Assessment/Plan:  1. Recent acute on chronic diastolic HF exacerbation - he is improved with 3 days of Zaroxolyn. Needs lab today. May need to consider just using once or twice a week. He is not going to restrict his salt. Further disposition to follow.   2. Persistent AF - HR is fine. On Coumadin.   3. Underlying PPM - does remote monitoring.  4. Chronic anticoagulation - monitored by PCP  5. Presumed CAD - typically his arm pain is his angina - he has stable NTG use. Would favor conservative management.   6. HTN - BP ok today. Will need to follow.   7. Advanced age - still doing pretty well. He is hard of hearing - we talked about driving - encouraged him to stop.   8. COVID-19 Education: The signs and symptoms of COVID-19 were discussed with  the patient and how to seek care for testing (follow up with PCP or arrange E-visit).  The importance of social distancing, staying at home, hand hygiene and wearing a mask when out in public were discussed today.  Current medicines are reviewed with the  patient today.  The patient does not have concerns regarding medicines other than what has been noted above.  The following changes have been made:  See above.  Labs/ tests ordered today include:    Orders Placed This Encounter  Procedures  . Basic metabolic panel     Disposition:   FU with me in about 2 months.    Patient is agreeable to this plan and will call if any problems develop in the interim.   SignedTruitt Merle, NP  06/03/2019 2:24 PM  [facility] [address] [place], [place]  [address] Phone: [phone] Fax: [phone]

## 2019-05-29 ENCOUNTER — Other Ambulatory Visit: Payer: Self-pay | Admitting: Nurse Practitioner

## 2019-06-03 ENCOUNTER — Encounter: Payer: Self-pay | Admitting: Nurse Practitioner

## 2019-06-03 ENCOUNTER — Ambulatory Visit (INDEPENDENT_AMBULATORY_CARE_PROVIDER_SITE_OTHER): Payer: Medicare Other | Admitting: Nurse Practitioner

## 2019-06-03 ENCOUNTER — Other Ambulatory Visit: Payer: Self-pay

## 2019-06-03 VITALS — BP 102/54 | HR 60 | Ht 71.0 in | Wt 225.0 lb

## 2019-06-03 DIAGNOSIS — I5031 Acute diastolic (congestive) heart failure: Secondary | ICD-10-CM | POA: Diagnosis not present

## 2019-06-03 DIAGNOSIS — I4819 Other persistent atrial fibrillation: Secondary | ICD-10-CM

## 2019-06-03 DIAGNOSIS — Z7901 Long term (current) use of anticoagulants: Secondary | ICD-10-CM

## 2019-06-03 DIAGNOSIS — I259 Chronic ischemic heart disease, unspecified: Secondary | ICD-10-CM

## 2019-06-03 DIAGNOSIS — I5032 Chronic diastolic (congestive) heart failure: Secondary | ICD-10-CM

## 2019-06-03 DIAGNOSIS — Z7189 Other specified counseling: Secondary | ICD-10-CM

## 2019-06-03 DIAGNOSIS — Z79899 Other long term (current) drug therapy: Secondary | ICD-10-CM | POA: Diagnosis not present

## 2019-06-03 NOTE — Patient Instructions (Addendum)
After Visit Summary:  We will be checking the following labs today - BMET   Medication Instructions:    Continue with your current medicines.  After I see what your lab shows, I will decide about using the Zaroxolyn just once or twice a week but for now - DO NOT TAKE   If you need a refill on your cardiac medications before your next appointment, please call your pharmacy.     Testing/Procedures To Be Arranged:  N/A  Follow-Up:  See me in 2 months   At [facility], you and your health needs are our priority.  As part of our continuing mission to provide you with exceptional heart care, we have created designated Provider Care Teams.  These Care Teams include your primary Cardiologist (physician) and Advanced Practice Providers (APPs -  Physician Assistants and Nurse Practitioners) who all work together to provide you with the care you need, when you need it.  Special Instructions:  . Stay safe, stay home, wash your hands for at least 20 seconds and wear a mask when out in public.  . It was good to talk with you today.    Call the [place] office at [phone] if you have any questions, problems or concerns.

## 2019-06-04 ENCOUNTER — Other Ambulatory Visit: Payer: Self-pay | Admitting: *Deleted

## 2019-06-04 ENCOUNTER — Other Ambulatory Visit: Payer: Self-pay | Admitting: Nurse Practitioner

## 2019-06-04 DIAGNOSIS — R748 Abnormal levels of other serum enzymes: Secondary | ICD-10-CM

## 2019-06-04 DIAGNOSIS — R799 Abnormal finding of blood chemistry, unspecified: Secondary | ICD-10-CM

## 2019-06-04 LAB — BASIC METABOLIC PANEL
BUN/Creatinine Ratio: 34 — ABNORMAL HIGH (ref 10–24)
BUN: 75 mg/dL — ABNORMAL HIGH (ref 10–36)
CO2: 26 mmol/L (ref 20–29)
Calcium: 9 mg/dL (ref 8.6–10.2)
Chloride: 85 mmol/L — ABNORMAL LOW (ref 96–106)
Creatinine, Ser: 2.23 mg/dL — ABNORMAL HIGH (ref 0.76–1.27)
GFR calc Af Amer: 28 mL/min/{1.73_m2} — ABNORMAL LOW (ref >59)
GFR calc non Af Amer: 24 mL/min/{1.73_m2} — ABNORMAL LOW (ref >59)
Glucose: 216 mg/dL — ABNORMAL HIGH (ref 65–99)
Potassium: 3.9 mmol/L (ref 3.5–5.2)
Sodium: 132 mmol/L — ABNORMAL LOW (ref 134–144)

## 2019-06-06 ENCOUNTER — Telehealth: Payer: Self-pay | Admitting: Nurse Practitioner

## 2019-06-06 NOTE — Telephone Encounter (Signed)
Spoke with the patient's daughter, who is a Marine scientist. She says her dad has been massaging Vick's Vaporub into his nostrils for 2 weeks. She is worried some of his symptoms/labs could be caused by toxic effects of the Vick's absorption. She instructed him to stop using the Vick's. She also reports his recent INR was 6.4 and he will be off Coumadin for a few days.   Per her request,  1) forwarded recent labs to Dr. Brigitte Pulse 2) rescheduled June 22 visit to June 21 as he had a scheduling conflict 3) will forward to [place] as FYI. She wishes to have Lori's input on above when she is called with lab results next week.

## 2019-06-06 NOTE — Telephone Encounter (Signed)
Follow Up  Patient's nurse/caregiver is on the line wanting to speak with a nurse about his blood pressure. States that it is currently 121/59 and patient has no energy, does not want to eat, and states that he is feeling strange. Nurse Deb would like to know if there is something that she can give the patient to boost his blood pressure and give him energy. Please give a call back to advise.

## 2019-06-06 NOTE — Telephone Encounter (Addendum)
The patient's nurse, Deb, called very concerned about the patient's blood pressure. It is 121/59. She states for about 2 days he has had no energy and he just wants to sleep. He also has no appetite which is new and very strange for him. She states his swelling is much better than last week and he has no SOB. She requests to have Cecille Rubin make medication changes to increase his BP. Reiterated to her that 121/59 is actually a little higher than his usual at the office.   Spoke with Cecille Rubin. She does not want to make any medication changes at this time. Informed the patient and his caregiver no changes are being made at this time and that his symptoms could be rebound from all the recent diuresis. Per Cecille Rubin, also instructed them to get COVID test if symptoms persist. They understand they will be called next week with lab results and for an update.

## 2019-06-06 NOTE — Telephone Encounter (Signed)
   Deb is calling, she requesting to send blood work result to pt's PCP Dr. Brigitte Pulse, they send to fax it to them ATTN: NP [facility]. She also wanted to provide report Pt's INR yesterday was very high and pt will be off blood thinner for few days and his ac1 is at 8  Please advise

## 2019-06-06 NOTE — Telephone Encounter (Signed)
His abnormality on his labs was from taking 3 days of Zaroxolyn due to volume overload that was not responding to his usual extra diuretic therapy.  I expect this to improve but will take some time. I am not familiar with the Vapor rub interacting.   Suspect the elevated INR is due to diet and recent events as well.   Will need to follow closely. Plan to see back as arranged with repeat lab already scheduled.   Thanks,  Cecille Rubin

## 2019-06-10 ENCOUNTER — Other Ambulatory Visit: Payer: Self-pay

## 2019-06-10 ENCOUNTER — Other Ambulatory Visit: Payer: Medicare Other

## 2019-06-10 DIAGNOSIS — R748 Abnormal levels of other serum enzymes: Secondary | ICD-10-CM

## 2019-06-10 DIAGNOSIS — R799 Abnormal finding of blood chemistry, unspecified: Secondary | ICD-10-CM

## 2019-06-11 ENCOUNTER — Telehealth: Payer: Self-pay | Admitting: *Deleted

## 2019-06-11 DIAGNOSIS — I5032 Chronic diastolic (congestive) heart failure: Secondary | ICD-10-CM

## 2019-06-11 DIAGNOSIS — R748 Abnormal levels of other serum enzymes: Secondary | ICD-10-CM

## 2019-06-11 LAB — BASIC METABOLIC PANEL
BUN/Creatinine Ratio: 26 — ABNORMAL HIGH (ref 10–24)
BUN: 57 mg/dL — ABNORMAL HIGH (ref 10–36)
CO2: 25 mmol/L (ref 20–29)
Calcium: 9.2 mg/dL (ref 8.6–10.2)
Chloride: 93 mmol/L — ABNORMAL LOW (ref 96–106)
Creatinine, Ser: 2.17 mg/dL — ABNORMAL HIGH (ref 0.76–1.27)
GFR calc Af Amer: 29 mL/min/{1.73_m2} — ABNORMAL LOW (ref >59)
GFR calc non Af Amer: 25 mL/min/{1.73_m2} — ABNORMAL LOW (ref >59)
Glucose: 191 mg/dL — ABNORMAL HIGH (ref 65–99)
Potassium: 4 mmol/L (ref 3.5–5.2)
Sodium: 136 mmol/L (ref 134–144)

## 2019-06-11 NOTE — Telephone Encounter (Signed)
Noted - suspect this was from over diuresis.  Glad it is improving.  Agree with continuing to monitor.   Cecille Rubin

## 2019-06-11 NOTE — Telephone Encounter (Signed)
-----   Message from Burtis Junes, NP sent at 06/11/2019  8:30 AM EDT ----- His lab continues to improve Lets recheck BMET again in one week. For now, no change with his medicines.

## 2019-06-11 NOTE — Telephone Encounter (Signed)
I spoke with patient's nurse Deb and reviewed results with her. Patient will come to office on 4/20 for BMET. Deb would like [name] to know when patient was feeling poorly on 4/9 his oxygen sats ranged from 82%-88%.  He had some vomiting that day. Once feeling better his sats returned to normal which is in the 90's.  Will drop to low 90's with activity.  Deb will monitor oxygen sats and keep record of readings.

## 2019-06-17 ENCOUNTER — Other Ambulatory Visit: Payer: Self-pay

## 2019-06-17 ENCOUNTER — Other Ambulatory Visit: Payer: Medicare Other

## 2019-06-17 DIAGNOSIS — R748 Abnormal levels of other serum enzymes: Secondary | ICD-10-CM

## 2019-06-17 DIAGNOSIS — I5032 Chronic diastolic (congestive) heart failure: Secondary | ICD-10-CM

## 2019-06-18 LAB — BASIC METABOLIC PANEL
BUN/Creatinine Ratio: 22 (ref 10–24)
BUN: 44 mg/dL — ABNORMAL HIGH (ref 10–36)
CO2: 24 mmol/L (ref 20–29)
Calcium: 9.5 mg/dL (ref 8.6–10.2)
Chloride: 91 mmol/L — ABNORMAL LOW (ref 96–106)
Creatinine, Ser: 1.96 mg/dL — ABNORMAL HIGH (ref 0.76–1.27)
GFR calc Af Amer: 33 mL/min/{1.73_m2} — ABNORMAL LOW (ref >59)
GFR calc non Af Amer: 28 mL/min/{1.73_m2} — ABNORMAL LOW (ref >59)
Glucose: 174 mg/dL — ABNORMAL HIGH (ref 65–99)
Potassium: 4.2 mmol/L (ref 3.5–5.2)
Sodium: 134 mmol/L (ref 134–144)

## 2019-06-19 ENCOUNTER — Emergency Department (HOSPITAL_COMMUNITY)
Admission: EM | Admit: 2019-06-19 | Discharge: 2019-06-20 | Disposition: A | Discharge status: Home / Self Care | Payer: Medicare Other | Attending: Emergency Medicine | Admitting: Emergency Medicine

## 2019-06-19 ENCOUNTER — Encounter (HOSPITAL_COMMUNITY): Payer: Self-pay | Admitting: Emergency Medicine

## 2019-06-19 ENCOUNTER — Other Ambulatory Visit: Payer: Self-pay

## 2019-06-19 ENCOUNTER — Emergency Department (HOSPITAL_COMMUNITY): Payer: Medicare Other

## 2019-06-19 DIAGNOSIS — R1114 Bilious vomiting: Secondary | ICD-10-CM | POA: Insufficient documentation

## 2019-06-19 DIAGNOSIS — I110 Hypertensive heart disease with heart failure: Secondary | ICD-10-CM | POA: Diagnosis not present

## 2019-06-19 DIAGNOSIS — I5042 Chronic combined systolic (congestive) and diastolic (congestive) heart failure: Secondary | ICD-10-CM | POA: Diagnosis not present

## 2019-06-19 DIAGNOSIS — Z79899 Other long term (current) drug therapy: Secondary | ICD-10-CM | POA: Diagnosis not present

## 2019-06-19 DIAGNOSIS — Z950 Presence of cardiac pacemaker: Secondary | ICD-10-CM | POA: Insufficient documentation

## 2019-06-19 DIAGNOSIS — Z7901 Long term (current) use of anticoagulants: Secondary | ICD-10-CM | POA: Insufficient documentation

## 2019-06-19 DIAGNOSIS — R1084 Generalized abdominal pain: Secondary | ICD-10-CM | POA: Insufficient documentation

## 2019-06-19 DIAGNOSIS — E039 Hypothyroidism, unspecified: Secondary | ICD-10-CM | POA: Insufficient documentation

## 2019-06-19 DIAGNOSIS — R531 Weakness: Secondary | ICD-10-CM | POA: Diagnosis not present

## 2019-06-19 DIAGNOSIS — R112 Nausea with vomiting, unspecified: Secondary | ICD-10-CM | POA: Diagnosis not present

## 2019-06-19 DIAGNOSIS — Z87891 Personal history of nicotine dependence: Secondary | ICD-10-CM | POA: Diagnosis not present

## 2019-06-19 DIAGNOSIS — R630 Anorexia: Secondary | ICD-10-CM | POA: Diagnosis not present

## 2019-06-19 DIAGNOSIS — R109 Unspecified abdominal pain: Secondary | ICD-10-CM | POA: Diagnosis present

## 2019-06-19 LAB — CBC WITH DIFFERENTIAL/PLATELET
Abs Immature Granulocytes: 0.05 10*3/uL (ref 0.00–0.07)
Basophils Absolute: 0.1 10*3/uL (ref 0.0–0.1)
Basophils Relative: 1 %
Eosinophils Absolute: 0 10*3/uL (ref 0.0–0.5)
Eosinophils Relative: 0 %
HCT: 37.5 % — ABNORMAL LOW (ref 39.0–52.0)
Hemoglobin: 12.4 g/dL — ABNORMAL LOW (ref 13.0–17.0)
Immature Granulocytes: 1 %
Lymphocytes Relative: 8 %
Lymphs Abs: 0.9 10*3/uL (ref 0.7–4.0)
MCH: 30.6 pg (ref 26.0–34.0)
MCHC: 33.1 g/dL (ref 30.0–36.0)
MCV: 92.6 fL (ref 80.0–100.0)
Monocytes Absolute: 0.4 10*3/uL (ref 0.1–1.0)
Monocytes Relative: 4 %
Neutro Abs: 9.4 10*3/uL — ABNORMAL HIGH (ref 1.7–7.7)
Neutrophils Relative %: 86 %
Platelets: 214 10*3/uL (ref 150–400)
RBC: 4.05 MIL/uL — ABNORMAL LOW (ref 4.22–5.81)
RDW: 13.2 % (ref 11.5–15.5)
WBC: 10.9 10*3/uL — ABNORMAL HIGH (ref 4.0–10.5)
nRBC: 0 % (ref 0.0–0.2)

## 2019-06-19 LAB — LIPASE, BLOOD: Lipase: 31 U/L (ref 11–51)

## 2019-06-19 LAB — COMPREHENSIVE METABOLIC PANEL
ALT: 26 U/L (ref 0–44)
AST: 25 U/L (ref 15–41)
Albumin: 3.8 g/dL (ref 3.5–5.0)
Alkaline Phosphatase: 71 U/L (ref 38–126)
Anion gap: 14 (ref 5–15)
BUN: 40 mg/dL — ABNORMAL HIGH (ref 8–23)
CO2: 26 mmol/L (ref 22–32)
Calcium: 9.5 mg/dL (ref 8.9–10.3)
Chloride: 91 mmol/L — ABNORMAL LOW (ref 98–111)
Creatinine, Ser: 1.84 mg/dL — ABNORMAL HIGH (ref 0.61–1.24)
GFR calc Af Amer: 36 mL/min — ABNORMAL LOW (ref >60)
GFR calc non Af Amer: 31 mL/min — ABNORMAL LOW (ref >60)
Glucose, Bld: 194 mg/dL — ABNORMAL HIGH (ref 70–99)
Potassium: 3.7 mmol/L (ref 3.5–5.1)
Sodium: 131 mmol/L — ABNORMAL LOW (ref 135–145)
Total Bilirubin: 1.1 mg/dL (ref 0.3–1.2)
Total Protein: 7.3 g/dL (ref 6.5–8.1)

## 2019-06-19 MED ORDER — SODIUM CHLORIDE 0.9 % IV SOLN: INTRAVENOUS | Status: DC

## 2019-06-19 MED ORDER — METOCLOPRAMIDE HCL 5 MG/ML IJ SOLN
10.0000 mg | Freq: Once | INTRAMUSCULAR | Status: AC
  Administered 2019-06-19: 10 mg via INTRAVENOUS
  Filled 2019-06-19: qty 2

## 2019-06-19 MED ORDER — FENTANYL CITRATE (PF) 100 MCG/2ML IJ SOLN
25.0000 ug | Freq: Once | INTRAMUSCULAR | Status: AC
  Administered 2019-06-19: 25 ug via INTRAVENOUS
  Filled 2019-06-19: qty 2

## 2019-06-19 NOTE — ED Notes (Signed)
Patient taken to CT.

## 2019-06-19 NOTE — ED Triage Notes (Signed)
Per EMS, patient from home, c/o abdominal pain with N/V and constipation x1 week. Decreased PO intake. Seen by PCP and scanned yesterday. +obstruction. A&Ox4.

## 2019-06-19 NOTE — ED Provider Notes (Signed)
[facility] DEPT Provider Note   CSN: [id number] Arrival date & time: 06/19/19  1856     History Chief Complaint  Patient presents with  . Emesis  . Abdominal Pain  . Constipation    NOLAWI KANADY is a 86 y.o. male.  HPI    Patient presents with concern of abdominal pain, nausea, vomiting. Onset was 4 days ago.  Since that time patient has persistent nausea, anorexia, and pain that is throughout the mid abdomen, possibly worse on the left, though inconsistently so. Patient has no history of abdominal surgery. He notes that during this illness he has been unable to tolerate substantial amounts of food, medicine, due to nausea, anorexia, and vomiting. No fever, no chest pain, no dyspnea. Yesterday the patient saw his physician, had x-ray performed, and with concern for possible obstruction today he presents for evaluation. Patient's medical problems include A. fib, and he is on anticoagulation.  Past Medical History:  Diagnosis Date  . 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 Anticoagulant long-term use   . Asthma    until age 15   . Atrial fibrillation (HCC)     on coumadin    . Atrioventricular block, complete ([place])   . BPH (benign prostatic hyperplasia)   . Chronic combined systolic and diastolic CHF (congestive heart failure) (HCC)    EF 40% by ECHO  8/14  - followed by [name]  . Diplopia 08/14/2013  . Encounter for care or replacement of suprapubic tube ([place])   . GERD (gastroesophageal reflux disease)   . Gout   . HOH (hard of hearing)    hearing aids  . Hypertension   . Hyponatremia 2014   HCTZ  . Hypothyroid   . Lumbar disc disease   . Macular degeneration   . Obesity (BMI 30-39.9)   . Orchitis of right testicle   . Osteoporosis   . Pacemaker-Medtronic 2005   generator change  . Sinusitis     Patient Active Problem List   Diagnosis Date Noted  . Borderline diabetes mellitus 11/08/2012  . Long-term (current)  use of anticoagulants 11/07/2012  . Chronic diastolic heart failure ([place]) 11/07/2012  . Lumbar disc disease 11/07/2012  . Obesity (BMI 30-39.9) 11/07/2012  . Acute hyponatremia 11/04/2012  . BPH with urinary obstruction 11/04/2012  . Atrial fibrillation ([place]) 07/18/2011  . Pacemaker-Medtronic   . Intrinsic asthma, unspecified 11/29/2007  . G E R D 11/14/2007  . Hypertension     Past Surgical History:  Procedure Laterality Date  . ABLATION  1990  . CATARACT EXTRACTION Bilateral   . CYSTOSCOPY N/A 12/11/2012   Procedure: CYSTOSCOPY;  Surgeon: [name], MD;  Location: [facility] ORS;  Service: Urology;  Laterality: N/A;  . [place]  . INSERTION OF SUPRAPUBIC CATHETER N/A 12/11/2012   Procedure: INSERTION OF SUPRAPUBIC CATHETER " NEEDS PERC BALLOON LIKE FOR PERC KIDNEY";  Surgeon: [name], MD;  Location: [facility];  Service: Urology;  Laterality: N/A;  . PACEMAKER GENERATOR CHANGE N/A 07/19/2011   Procedure: PACEMAKER GENERATOR CHANGE;  Surgeon: [name], MD;  Location: [facility] CATH LAB;  Service: Cardiovascular;  Laterality: N/A;  . PACEMAKER INSERTION    . ROBOT ASSISTED LAPAROSCOPIC RADICAL PROSTATECTOMY N/A 02/10/2013   Procedure: ROBOTIC ASSISTED LAPAROSCOPIC SIMPLE PROSTATECTOMY;  Surgeon: [name], MD;  Location: [facility];  Service: Urology;  Laterality: N/A;  . SUPRPUBIC TUBE    . TRANSURETHRAL RESECTION OF PROSTATE  Family History  Problem Relation Age of Onset  . Heart disease Mother   . Rheum arthritis Mother   . Deep vein thrombosis Sister     Social History   Tobacco Use  . Smoking status: Former Smoker    Packs/day: 1.00    Years: 2.00    Pack years: 2.00    Types: Cigarettes    Quit date: 07/17/1941    Years since quitting: 77.9  . Smokeless tobacco: Never Used  Substance Use Topics  . Alcohol use: No  . Drug use: No    Home Medications Prior to Admission medications   Medication Sig Start Date End Date Taking?  Authorizing Provider  beta carotene w/minerals (OCUVITE) tablet Take 1 tablet by mouth at bedtime.    [provider]  bisoprolol (ZEBETA) 10 MG tablet Take 1 tablet (10 mg total) by mouth 2 (two) times daily. 09/18/18   Burtis Junes, NP  budesonide-formoterol (SYMBICORT) 160-4.5 MCG/ACT inhaler Inhale 2 puffs into the lungs 2 (two) times daily.    [provider]  Calcium Carbonate-Vitamin D (CALCIUM 600 + D PO) Take 1 tablet by mouth daily.    [provider]  cholecalciferol (VITAMIN D) 1000 UNITS tablet Take 2,000 Units by mouth daily.    [provider]  Colchicine 0.6 MG CAPS Take 1 capsule by mouth as needed. 05/28/19   [provider]  finasteride (PROSCAR) 5 MG tablet Take 5 mg by mouth daily.     [provider]  furosemide (LASIX) 80 MG tablet TAKE 1 TABLET BY MOUTH  TWICE DAILY 11/19/18   Burtis Junes, NP  hydrALAZINE (APRESOLINE) 50 MG tablet Take 50 mg by mouth 2 (two) times daily.    [provider]  irbesartan (AVAPRO) 150 MG tablet Take 1 tablet (150 mg total) by mouth daily. 09/11/18   Burtis Junes, NP  isosorbide mononitrate (IMDUR) 30 MG 24 hr tablet Take 1 tablet (30 mg total) by mouth daily. 09/23/18 09/23/19  Burtis Junes, NP  levothyroxine (SYNTHROID, LEVOTHROID) 75 MCG tablet Take 75 mcg by mouth daily before breakfast.    [provider]  Multiple Vitamin (MULTIVITAMIN WITH MINERALS) TABS tablet Take 1 tablet by mouth daily.    [provider]  nitroGLYCERIN (NITROSTAT) 0.4 MG SL tablet PLACE 1 TABLET UNDER THE TONGUE EVERY 5 MINUTES AS NEEDED FOR CHEST PAIN 04/15/19   Burtis Junes, NP  omega-3 acid ethyl esters (LOVAZA) 1 G capsule Take 1 g by mouth every morning.    [provider]  omeprazole (PRILOSEC) 20 MG capsule Take 20 mg by mouth at bedtime.     [provider]  simethicone (GAS-X) 80 MG chewable tablet Chew 1 tablet (80 mg total) by mouth every 6 (six)  hours as needed (bloating). 10/06/16   Julianne Rice, MD  temazepam (RESTORIL) 15 MG capsule Take 15 mg by mouth daily. 10/02/16   [provider]  traMADol (ULTRAM) 50 MG tablet Take 1 tablet (50 mg total) by mouth every 6 (six) hours as needed for moderate pain. Post-operatively. 02/14/13   [name], MD  warfarin (COUMADIN) 3 MG tablet Take 1.5-3 mg by mouth daily. Takes 0.5 tablet (1.5 mg) on Wednesday and Saturday and Take 1 tablet (3 mg) on all other days.    [provider]    Allergies    Hctz [hydrochlorothiazide]  Review of Systems   Review of Systems  Constitutional:  Per HPI, otherwise negative  HENT:       Per HPI, otherwise negative  Respiratory:       Per HPI, otherwise negative  Cardiovascular:       Per HPI, otherwise negative  Gastrointestinal: Positive for abdominal pain, nausea and vomiting.  Endocrine:       Negative aside from HPI  Genitourinary:       Neg aside from HPI   Musculoskeletal:       Per HPI, otherwise negative  Skin: Negative.   Neurological: Negative for syncope.    Physical Exam Updated Vital Signs BP (!) 167/69   Pulse (!) 58   Temp 97.8 F (36.6 C) (Oral)   Resp 17   SpO2 96%   Physical Exam Vitals and nursing note reviewed.  Constitutional:      Appearance: He is well-developed.  HENT:     Head: Normocephalic and atraumatic.  Eyes:     Conjunctiva/sclera: Conjunctivae normal.  Cardiovascular:     Rate and Rhythm: Normal rate and regular rhythm.  Pulmonary:     Effort: Pulmonary effort is normal. No respiratory distress.     Breath sounds: No stridor.  Abdominal:     General: There is no distension.     Tenderness: There is abdominal tenderness in the right upper quadrant, periumbilical area and left upper quadrant.  Skin:    General: Skin is warm and dry.  Neurological:     Mental Status: He is alert and oriented to person, place, and time.     ED Results / Procedures / Treatments     Labs (all labs ordered are listed, but only abnormal results are displayed) Labs Reviewed  COMPREHENSIVE METABOLIC PANEL - Abnormal; Notable for the following components:      Result Value   Sodium 131 (*)    Chloride 91 (*)    Glucose, Bld 194 (*)    BUN 40 (*)    Creatinine, Ser 1.84 (*)    GFR calc non Af Amer 31 (*)    GFR calc Af Amer 36 (*)    All other components within normal limits  CBC WITH DIFFERENTIAL/PLATELET - Abnormal; Notable for the following components:   WBC 10.9 (*)    RBC 4.05 (*)    Hemoglobin 12.4 (*)    HCT 37.5 (*)    Neutro Abs 9.4 (*)    All other components within normal limits  LIPASE, BLOOD  URINALYSIS, ROUTINE W REFLEX MICROSCOPIC    EKG None  Radiology CT ABDOMEN PELVIS WO CONTRAST  Result Date: 06/19/2019 CLINICAL DATA:  86 year old male with concern for bowel obstruction. EXAM: CT ABDOMEN AND PELVIS WITHOUT CONTRAST TECHNIQUE: Multidetector CT imaging of the abdomen and pelvis was performed following the standard protocol without IV contrast. COMPARISON:  CT abdomen pelvis dated 10/06/2016. FINDINGS: Lower chest: There is a 6 mm right lower lobe pulmonary nodule, not significantly changed since the prior CT. The visualized lung bases are otherwise clear. Coronary vascular calcification and cardiac pacemaker wire. There is no intra-abdominal free air or free fluid. Hepatobiliary: There is a 2 cm left hepatic cyst. There is slight irregularity of the liver contour which may represent changes of cirrhosis. Clinical correlation is recommended. No intrahepatic biliary ductal dilatation. The gallbladder is unremarkable. Pancreas: The pancreas is moderately atrophic. No active inflammatory changes or dilatation of the main pancreatic duct. Spleen: Normal in size without focal abnormality. Adrenals/Urinary Tract: The adrenal glands are unremarkable. Moderate bilateral renal parenchyma atrophy. There  is no hydronephrosis or nephrolithiasis on either side.  Several small bilateral renal hypodense lesions, similar to prior CT. Several subcentimeter high attenuating renal lesions are not characterized but may represent hemorrhagic or proteinaceous cysts. 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 MRI may provide better evaluation of the renal lesions if clinically indicated. The visualized ureters appear unremarkable. The urinary bladder is partially distended. There is slight trabeculation of the bladder wall likely related to chronic bladder outlet obstruction. Stomach/Bowel: There is a small hiatal hernia. There is sigmoid diverticulosis without active inflammatory changes. There is no bowel obstruction or active inflammation. The appendix is normal. Vascular/Lymphatic: There is advanced aortoiliac atherosclerotic disease. The IVC is grossly unremarkable. No portal venous gas. There is no adenopathy. Reproductive: Enlarged prostate gland measuring approximately 9 cm in transverse axial diameter and increased since the prior CT. Other: Small fat containing umbilical hernia. Musculoskeletal: Osteopenia with degenerative changes of the spine. No acute osseous pathology. IMPRESSION: 1. No acute intra-abdominal or pelvic pathology. No bowel obstruction. Normal appendix. 2. Sigmoid diverticulosis. 3. Enlarged prostate gland with findings of chronic bladder outlet obstruction. 4. Aortic Atherosclerosis (ICD10-I70.0). Electronically Signed   By: Anner Crete M.D.   On: 06/19/2019 21:09    Procedures Procedures (including critical care time)  Medications Ordered in ED Medications  0.9 %  sodium chloride infusion ( Intravenous New Bag/Given 06/19/19 2015)  fentaNYL (SUBLIMAZE) injection 25 mcg (25 mcg Intravenous Given 06/19/19 2010)  metoCLOPramide (REGLAN) injection 10 mg (10 mg Intravenous Given 06/19/19 2010)    ED Course  I have reviewed the triage vital signs and the nursing  notes.  Pertinent labs & imaging results that were available during my care of the patient were reviewed by me and considered in my medical decision making (see chart for details).   With consideration of bowel obstruction versus infection patient had labs, CT ordered. Given the patient's nausea, vomiting, pain, Reglan, fentanyl, IV fluids ordered as well.  11:35 PM Patient sleeping.  I reviewed the patient's labs, notable for slight elevation in creatinine, mild leukocytosis, both consistent with patient's description nausea, vomiting.  Labs otherwise generally reassuring.  Patient CT scan reviewed as well, no evidence for acute obstruction, nor obvious infection.  Following his initial intervention with fluids, Reglan, Bentyl, the patient has fallen asleep, and now appears substantially more comfortable.  Nursing staff notes that the patient described provement of his pain, prior to falling asleep.  UA pending.  Patient will require additional evaluation, after his medications have worn off, for consideration of additional interventions, therapeutics, admission versus discharge.  Dr. Florina Ou is aware of the patient and will follow.  Final Clinical Impression(s) / ED Diagnoses Final diagnoses:  Generalized abdominal pain  Bilious vomiting with nausea  Weakness      Carmin Muskrat, MD 06/19/19 [id number]

## 2019-06-20 ENCOUNTER — Ambulatory Visit: Payer: Medicare Other | Admitting: Podiatry

## 2019-06-20 LAB — URINALYSIS, ROUTINE W REFLEX MICROSCOPIC
Bilirubin Urine: NEGATIVE
Glucose, UA: NEGATIVE mg/dL
Ketones, ur: NEGATIVE mg/dL
Nitrite: NEGATIVE
Protein, ur: 100 mg/dL — AB
RBC / HPF: >50 RBC/hpf — ABNORMAL HIGH (ref 0–5)
Specific Gravity, Urine: 1.01 (ref 1.005–1.030)
pH: 6 (ref 5.0–8.0)

## 2019-06-20 NOTE — ED Notes (Signed)
Bladder scan showed 129 mL of urine

## 2019-06-20 NOTE — ED Notes (Signed)
Lab contacted to add on urine culture.

## 2019-06-20 NOTE — ED Provider Notes (Signed)
Nursing notes and vitals signs, including pulse oximetry, reviewed.  Summary of this visit's results, reviewed by myself:  EKG:  EKG Interpretation  Date/Time:    Ventricular Rate:    PR Interval:    QRS Duration:   QT Interval:    QTC Calculation:   R Axis:     Text Interpretation:         Labs:  Results for orders placed or performed during the hospital encounter of 06/19/19 (from the past 24 hour(s))  Comprehensive metabolic panel     Status: Abnormal   Collection Time: 06/19/19  7:44 PM  Result Value Ref Range   Sodium 131 (L) 135 - 145 mmol/L   Potassium 3.7 3.5 - 5.1 mmol/L   Chloride 91 (L) 98 - 111 mmol/L   CO2 26 22 - 32 mmol/L   Glucose, Bld 194 (H) 70 - 99 mg/dL   BUN 40 (H) 8 - 23 mg/dL   Creatinine, Ser 1.84 (H) 0.61 - 1.24 mg/dL   Calcium 9.5 8.9 - 10.3 mg/dL   Total Protein 7.3 6.5 - 8.1 g/dL   Albumin 3.8 3.5 - 5.0 g/dL   AST 25 15 - 41 U/L   ALT 26 0 - 44 U/L   Alkaline Phosphatase 71 38 - 126 U/L   Total Bilirubin 1.1 0.3 - 1.2 mg/dL   GFR calc non Af Amer 31 (L) >60 mL/min   GFR calc Af Amer 36 (L) >60 mL/min   Anion gap 14 5 - 15  Lipase, blood     Status: None   Collection Time: 06/19/19  7:44 PM  Result Value Ref Range   Lipase 31 11 - 51 U/L  CBC WITH DIFFERENTIAL     Status: Abnormal   Collection Time: 06/19/19  7:44 PM  Result Value Ref Range   WBC 10.9 (H) 4.0 - 10.5 K/uL   RBC 4.05 (L) 4.22 - 5.81 MIL/uL   Hemoglobin 12.4 (L) 13.0 - 17.0 g/dL   HCT 37.5 (L) 39.0 - 52.0 %   MCV 92.6 80.0 - 100.0 fL   MCH 30.6 26.0 - 34.0 pg   MCHC 33.1 30.0 - 36.0 g/dL   RDW 13.2 11.5 - 15.5 %   Platelets 214 150 - 400 K/uL   nRBC 0.0 0.0 - 0.2 %   Neutrophils Relative % 86 %   Neutro Abs 9.4 (H) 1.7 - 7.7 K/uL   Lymphocytes Relative 8 %   Lymphs Abs 0.9 0.7 - 4.0 K/uL   Monocytes Relative 4 %   Monocytes Absolute 0.4 0.1 - 1.0 K/uL   Eosinophils Relative 0 %   Eosinophils Absolute 0.0 0.0 - 0.5 K/uL   Basophils Relative 1 %   Basophils  Absolute 0.1 0.0 - 0.1 K/uL   Immature Granulocytes 1 %   Abs Immature Granulocytes 0.05 0.00 - 0.07 K/uL  Urinalysis, Routine w reflex microscopic     Status: Abnormal   Collection Time: 06/20/19  4:01 AM  Result Value Ref Range   Color, Urine RED (A) YELLOW   APPearance CLOUDY (A) CLEAR   Specific Gravity, Urine 1.010 1.005 - 1.030   pH 6.0 5.0 - 8.0   Glucose, UA NEGATIVE NEGATIVE mg/dL   Hgb urine dipstick LARGE (A) NEGATIVE   Bilirubin Urine NEGATIVE NEGATIVE   Ketones, ur NEGATIVE NEGATIVE mg/dL   Protein, ur 100 (A) NEGATIVE mg/dL   Nitrite NEGATIVE NEGATIVE   Leukocytes,Ua TRACE (A) NEGATIVE   RBC / HPF >50 (H) 0 -  5 RBC/hpf   WBC, UA 6-10 0 - 5 WBC/hpf   Bacteria, UA FEW (A) NONE SEEN   Squamous Epithelial / LPF 0-5 0 - 5    Imaging Studies: CT ABDOMEN PELVIS WO CONTRAST  Result Date: 06/19/2019 CLINICAL DATA:  86 year old male with concern for bowel obstruction. EXAM: CT ABDOMEN AND PELVIS WITHOUT CONTRAST TECHNIQUE: Multidetector CT imaging of the abdomen and pelvis was performed following the standard protocol without IV contrast. COMPARISON:  CT abdomen pelvis dated 10/06/2016. FINDINGS: Lower chest: There is a 6 mm right lower lobe pulmonary nodule, not significantly changed since the prior CT. The visualized lung bases are otherwise clear. Coronary vascular calcification and cardiac pacemaker wire. There is no intra-abdominal free air or free fluid. Hepatobiliary: There is a 2 cm left hepatic cyst. There is slight irregularity of the liver contour which may represent changes of cirrhosis. Clinical correlation is recommended. No intrahepatic biliary ductal dilatation. The gallbladder is unremarkable. Pancreas: The pancreas is moderately atrophic. No active inflammatory changes or dilatation of the main pancreatic duct. Spleen: Normal in size without focal abnormality. Adrenals/Urinary Tract: The adrenal glands are unremarkable. Moderate bilateral renal parenchyma atrophy.  There is no hydronephrosis or nephrolithiasis on either side. Several small bilateral renal hypodense lesions, similar to prior CT. Several subcentimeter high attenuating renal lesions are not characterized but may represent hemorrhagic or proteinaceous cysts. 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 MRI may provide better evaluation of the renal lesions if clinically indicated. The visualized ureters appear unremarkable. The urinary bladder is partially distended. There is slight trabeculation of the bladder wall likely related to chronic bladder outlet obstruction. Stomach/Bowel: There is a small hiatal hernia. There is sigmoid diverticulosis without active inflammatory changes. There is no bowel obstruction or active inflammation. The appendix is normal. Vascular/Lymphatic: There is advanced aortoiliac atherosclerotic disease. The IVC is grossly unremarkable. No portal venous gas. There is no adenopathy. Reproductive: Enlarged prostate gland measuring approximately 9 cm in transverse axial diameter and increased since the prior CT. Other: Small fat containing umbilical hernia. Musculoskeletal: Osteopenia with degenerative changes of the spine. No acute osseous pathology. IMPRESSION: 1. No acute intra-abdominal or pelvic pathology. No bowel obstruction. Normal appendix. 2. Sigmoid diverticulosis. 3. Enlarged prostate gland with findings of chronic bladder outlet obstruction. 4. Aortic Atherosclerosis (ICD10-I70.0). Electronically Signed   By: Anner Crete M.D.   On: 06/19/2019 21:09   5:12 AM Urinalysis shows hematuria which is likely a complication of in and out catheterization.  This is not diagnostic of urinary tract infection but has been sent for culture.    Shonica Weier, Jenny Reichmann, MD 06/20/19 [phone]

## 2019-06-20 NOTE — ED Notes (Addendum)
Attempted to call patients son x 1. Will call back.

## 2019-06-20 NOTE — ED Notes (Addendum)
Attempted to call patients son x 2. No answer. Pt unable to give address or any other information regarding discharge location.

## 2019-06-21 LAB — URINE CULTURE: Culture: NO GROWTH

## 2019-07-10 ENCOUNTER — Encounter (INDEPENDENT_AMBULATORY_CARE_PROVIDER_SITE_OTHER): Payer: Medicare Other | Admitting: Ophthalmology

## 2019-07-10 ENCOUNTER — Other Ambulatory Visit: Payer: Self-pay

## 2019-07-10 DIAGNOSIS — H35372 Puckering of macula, left eye: Secondary | ICD-10-CM

## 2019-07-10 DIAGNOSIS — H353221 Exudative age-related macular degeneration, left eye, with active choroidal neovascularization: Secondary | ICD-10-CM

## 2019-07-10 DIAGNOSIS — H34832 Tributary (branch) retinal vein occlusion, left eye, with macular edema: Secondary | ICD-10-CM | POA: Diagnosis not present

## 2019-07-10 DIAGNOSIS — I10 Essential (primary) hypertension: Secondary | ICD-10-CM | POA: Diagnosis not present

## 2019-07-10 DIAGNOSIS — H35033 Hypertensive retinopathy, bilateral: Secondary | ICD-10-CM

## 2019-07-10 DIAGNOSIS — H353112 Nonexudative age-related macular degeneration, right eye, intermediate dry stage: Secondary | ICD-10-CM

## 2019-07-10 DIAGNOSIS — H4313 Vitreous hemorrhage, bilateral: Secondary | ICD-10-CM

## 2019-07-20 ENCOUNTER — Telehealth: Payer: Self-pay | Admitting: Cardiology

## 2019-07-20 NOTE — Telephone Encounter (Signed)
   Pt called in reporting he has been short of breath and had LE edema over the past 2 days. Normally takes 80mg  of lasix daily. Today he attempted to take an extra 40mg  but has not had much UOP. Has also had trouble with orthopnea and PND. Talked with caregiver on the phone as well. She reported he had a Rx for metolazone 2.5mg . I advised it was ok to take one tablet and follow symptoms/UOP over the next 2-3 hours but if no improvement would recommend that he go to the ED as he may need IV lasix. They voiced understanding and thanked me for callback.

## 2019-07-21 ENCOUNTER — Telehealth: Payer: Self-pay | Admitting: Nurse Practitioner

## 2019-07-21 NOTE — Progress Notes (Signed)
CARDIOLOGY OFFICE NOTE  Date:  07/22/2019    [name] Date of Birth: [birth year] Medical Record #[id number]  PCP:  [name], MD  Cardiologist:  [name]   Chief Complaint  Patient presents with  . Follow-up    History of Present Illness: [name] is a 86 y.o. male who presents today for a work in visit.  Former patient of [name] -nowfollowing with me.  He has a history of chronic diastolic HF, PAF- on Coumadin, HTN, CAD with coronary calcifications on prior CT and prior cath in 2018 showing no significant disease, MV regurgitation, obesity, HLD, chronic anticoagulation, underlying PPM (for second degree AV block in 2005 in [place]) and prior SVT with prior ablation in 1988 at [place]. Other issues include obesity, BPH, asthma, gout, hypothyroidism, & GERD.  Noted EF of 55% by echo from 10/2016.  He was lastseenby [name] 2019. He hadhad progressive shortness of breath despite escalating doses of diuretics. He hadbeen caring for his wife who is on Hospice.I then met him in [place] assumed his care- his wife had passed. He was doing better clinically with a higher dose of Lasix. Felt to be doing ok. His PPM checkswerearranged to be followed here.I have followed him since -he has been able to hire someone who stays in his home 24/7 now - he has had issues with more shortness of breath and angina (typically manifested by bilateral arm pain). I last saw him in the office back in July - he was felt to be holding his own - he does use sl NTG and extra Lasix on occasion. BP was running low and we cut his ARB back. Coumadin is checked by Dr. Brigitte Pulse.  We have had to intermittently increase his diuretics and nitrate therapy for better symptom control.   When seen in January - was doing ok. Had been going out and about despite the pandemic. Overall felt to be doing ok from our standpoint.   Last seen in April - had called  with increasing weight - had to use Zaroxolyn for 3 days - had marked electrolyte abnormality but improved clinically. He has around the clock care - they noted that he was still driving - [id number] down the high way. He has been vaccinated.   The patient does not have symptoms concerning for COVID-19 infection (fever, chills, cough, or new shortness of breath).   Comes in today. Here with his new caregiver today - Kat. Tells me that he thought he was dying. Seemed like his weight has crept back up. Unclear what he has been eating. He has a new caregiver as of just a few days ago. He has had chest pain - "felt like fluid". He called the answering service - took a dose of Zaroxolyn - made lots of urine yesterday and feels better today. No real arm pain. Unclear how much NTG he has been using. He is very hard of hearing. Had more swelling in his feet and legs.   Past Medical History:  Diagnosis Date  . 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 Anticoagulant long-term use   . Asthma    until age 89   . Atrial fibrillation (HCC)     on coumadin    . Atrioventricular block, complete ([place])   . BPH (benign prostatic hyperplasia)   . Chronic combined systolic and diastolic CHF (congestive heart failure) (HCC)    EF 40% by ECHO  8/14  -  followed by [name]  . Diplopia 08/14/2013  . Encounter for care or replacement of suprapubic tube ([place])   . GERD (gastroesophageal reflux disease)   . Gout   . HOH (hard of hearing)    hearing aids  . Hypertension   . Hyponatremia 2014   HCTZ  . Hypothyroid   . Lumbar disc disease   . Macular degeneration   . Obesity (BMI 30-39.9)   . Orchitis of right testicle   . Osteoporosis   . Pacemaker-Medtronic 2005   generator change  . Sinusitis     Past Surgical History:  Procedure Laterality Date  . ABLATION  1990  . CATARACT EXTRACTION Bilateral   . CYSTOSCOPY N/A 12/11/2012   Procedure: CYSTOSCOPY;  Surgeon: [name], MD;  Location: [facility];   Service: Urology;  Laterality: N/A;  . [place]  . INSERTION OF SUPRAPUBIC CATHETER N/A 12/11/2012   Procedure: INSERTION OF SUPRAPUBIC CATHETER " NEEDS PERC BALLOON LIKE FOR PERC KIDNEY";  Surgeon: [name], MD;  Location: [facility];  Service: Urology;  Laterality: N/A;  . PACEMAKER GENERATOR CHANGE N/A 07/19/2011   Procedure: PACEMAKER GENERATOR CHANGE;  Surgeon: [name], MD;  Location: [facility] CATH LAB;  Service: Cardiovascular;  Laterality: N/A;  . PACEMAKER INSERTION    . ROBOT ASSISTED LAPAROSCOPIC RADICAL PROSTATECTOMY N/A 02/10/2013   Procedure: ROBOTIC ASSISTED LAPAROSCOPIC SIMPLE PROSTATECTOMY;  Surgeon: [name], MD;  Location: [facility];  Service: Urology;  Laterality: N/A;  . SUPRPUBIC TUBE    . TRANSURETHRAL RESECTION OF PROSTATE       Medications: Current Meds  Medication Sig  . allopurinol (ZYLOPRIM) 100 MG tablet Take 100 mg by mouth daily.  . beta carotene w/minerals (OCUVITE) tablet Take 1 tablet by mouth at bedtime.  . bisoprolol (ZEBETA) 10 MG tablet Take 1 tablet (10 mg total) by mouth 2 (two) times daily.  . budesonide-formoterol (SYMBICORT) 160-4.5 MCG/ACT inhaler Inhale 2 puffs into the lungs 2 (two) times daily.  . Calcium Carbonate-Vitamin D (CALCIUM 600 + D PO) Take 1 tablet by mouth daily.  . cholecalciferol (VITAMIN D) 1000 UNITS tablet Take 2,000 Units by mouth daily.  . Colchicine 0.6 MG CAPS Take 1 capsule by mouth as needed.  [name] erythromycin ophthalmic ointment APPLY 1 CM RIBBON TO LEFT EYE 4 TIMES DAILY FOR 1 WEEK  . finasteride (PROSCAR) 5 MG tablet Take 5 mg by mouth daily.   . furosemide (LASIX) 80 MG tablet TAKE 1 TABLET BY MOUTH  TWICE DAILY  . hydrALAZINE (APRESOLINE) 50 MG tablet Take 50 mg by mouth 2 (two) times daily.  . irbesartan (AVAPRO) 150 MG tablet Take 150 mg by mouth daily. Per patient taking 1/2 tablet  . levothyroxine (SYNTHROID) 100 MCG tablet Take 100 mcg by mouth daily.  . Multiple Vitamin (MULTIVITAMIN  WITH MINERALS) TABS tablet Take 1 tablet by mouth daily.  . nitroGLYCERIN (NITROSTAT) 0.4 MG SL tablet PLACE 1 TABLET UNDER THE TONGUE EVERY 5 MINUTES AS NEEDED FOR CHEST PAIN  . omega-3 acid ethyl esters (LOVAZA) 1 G capsule Take 1 g by mouth every morning.  [name] omeprazole (PRILOSEC) 20 MG capsule Take 20 mg by mouth at bedtime.   [name] Delica Lancets 58I MISC USE TO SELF MONITOR BLOOD GLUCOSE TWICE DAILY  . ONETOUCH VERIO test strip USE TO SELF MONITOR BLOOD GLUCOSE TWICE DAILY  . simethicone (GAS-X) 80 MG chewable tablet Chew 1 tablet (80 mg total) by mouth every 6 (six) hours as  needed (bloating).  . temazepam (RESTORIL) 15 MG capsule Take 15 mg by mouth daily.  . traMADol (ULTRAM) 50 MG tablet Take 1 tablet (50 mg total) by mouth every 6 (six) hours as needed for moderate pain. Post-operatively.  . warfarin (COUMADIN) 3 MG tablet Take 1.5-3 mg by mouth daily. Takes 0.5 tablet (1.5 mg) on Wednesday and Saturday and Take 1 tablet (3 mg) on all other days.  . [DISCONTINUED] isosorbide mononitrate (IMDUR) 30 MG 24 hr tablet Take 1 tablet (30 mg total) by mouth daily.     Allergies: Allergies  Allergen Reactions  . Hctz [Hydrochlorothiazide]     Hyponatremia     Social History: The patient  reports that he quit smoking about 78 years ago. His smoking use included cigarettes. He has a 2.00 pack-year smoking history. He has never used smokeless tobacco. He reports that he does not drink alcohol or use drugs.   Family History: The patient's family history includes Deep vein thrombosis in his sister; Heart disease in his mother; Rheum arthritis in his mother.   Review of Systems: Please see the history of present illness.   All other systems are reviewed and negative.   Physical Exam: VS:  BP 130/60   Pulse 62   Ht 5' 11"  (1.803 m)   Wt 231 lb (104.8 kg)   SpO2 96%   BMI 32.22 kg/m  .  BMI Body mass index is 32.22 kg/m.  Wt Readings from Last 3 Encounters:  07/22/19 231 lb  (104.8 kg)  06/03/19 225 lb (102.1 kg)  01/03/19 220 lb (99.8 kg)    General: Elderly. He is hard of hearing - looks chronically ill but in no acute distress.   Cardiac: Irregular irregular rhythm. Regular rate and rhythm. No murmurs, rubs, or gallops. 1+ edema.  Respiratory:  Lungs are clear to auscultation bilaterally with normal work of breathing.  GI: Soft and nontender.  MS: No deformity or atrophy. Gait not tested.  Skin: Warm and dry. Color is normal.  Neuro:  Strength and sensation are intact and no gross focal deficits noted.  Psych: Alert, appropriate and with normal affect.   LABORATORY DATA:  EKG:  EKG is ordered today.  Personally reviewed by me. This demonstrates V pacing with underlying AF.  Lab Results  Component Value Date   WBC 10.9 (H) 06/19/2019   HGB 12.4 (L) 06/19/2019   HCT 37.5 (L) 06/19/2019   PLT 214 06/19/2019   GLUCOSE 194 (H) 06/19/2019   ALT 26 06/19/2019   AST 25 06/19/2019   NA 131 (L) 06/19/2019   K 3.7 06/19/2019   CL 91 (L) 06/19/2019   CREATININE 1.84 (H) 06/19/2019   BUN 40 (H) 06/19/2019   CO2 26 06/19/2019   TSH 4.750 (H) 09/11/2018   INR 1.87 10/06/2016   HGBA1C 6.6 (H) 11/07/2012       BNP (last 3 results) No results for input(s): BNP in the last 8760 hours.  ProBNP (last 3 results) No results for input(s): PROBNP in the last 8760 hours.   Other Studies Reviewed Today:   Assessment/Plan:  1. Recurrent acute on chronic diastolic HF - we will go to biweekly Zaroxolyn 2.5 - take on Sundays and Wednesdays. Salt use is questionable and I don't really see that changing.   2. Chest pain/presumed CAD - could most certainly be angina - increasing his Imdur today to 60 mg. Typically his arm pain is his angina.   3. Underlying PPM  4. Persistent  AF - HR ok - remains on Coumadin for his anticoagulation.  5. HTN - BP ok today.   65. Advanced age - he told me he is a DNR - he has lost his paper - wants another one signed today  - which I have done.   7. COVID-19 Education: The signs and symptoms of COVID-19 were discussed with the patient and how to seek care for testing (follow up with PCP or arrange E-visit).  The importance of social distancing, staying at home, hand hygiene and wearing a mask when out in public were discussed today.  Current medicines are reviewed with the patient today.  The patient does not have concerns regarding medicines other than what has been noted above.  The following changes have been made:  See above.  Labs/ tests ordered today include:    Orders Placed This Encounter  Procedures  . DNR (Do Not Resuscitate)  . EKG 12-Lead     Disposition:   FU with me as planned next month.   Patient is agreeable to this plan and will call if any problems develop in the interim.   SignedTruitt Merle, NP  07/22/2019 4:41 PM  [facility] [address] [place], [place]  [address] Phone: [phone] Fax: [phone]

## 2019-07-21 NOTE — Telephone Encounter (Signed)
New Message    Pt c/o of Chest Pain: STAT if CP now or developed within 24 hours  1. Are you having CP right now? Chest tightness, yes right now. Pt says its more of a tightness and not a pain  Pt says he feels like he has water on his chest   2. Are you experiencing any other symptoms (ex. SOB, nausea, vomiting, sweating)? SOB, over the last few days the SOB has gotten worse . Pt is having trouble urinating as well and has been drinking water and Gatorade. Pt has urinated after drinking fluids. Pt has not been sleeping well at night  3. How long have you been experiencing CP? Been on going for a couple of weeks   4. Is your CP continuous or coming and going? Continuous   5. Have you taken Nitroglycerin? Caregiver says pt used a Nitro on Saturday  ?  Caregiver Elenore Rota has been with pt all weekend. Yesterday BP 144/73 hr 60  Yesterday Blood Sugar 166

## 2019-07-21 NOTE — Telephone Encounter (Signed)
S/w caregiver [name]) with pt's permission does not want to go to ER.  Has had chest pressure since Thursday, does not let up, it is constant.   Nothing makes the pressure better, nothing makes the pressure worse.  Pt is out on patio eating lunch.  Pt has upcoming appt tomorrow with Kathrene Alu.

## 2019-07-21 NOTE — Telephone Encounter (Signed)
I can see him tomorrow unless he feels like he needs to go on to the ER today.   [name]

## 2019-07-21 NOTE — Telephone Encounter (Signed)
No answer on call back number provided. I placed call to home number listed for patient. Patient answered phone and asked me to speak with his caregiver. I spoke with patient's caregiver [name]).   She has been with patient since Thursday and this is the first time she has worked with him. She reports patient has had constant chest tightness since Thursday. No arm pain.  Pain is constant and never goes away. Patient has not had this type of tightness before.  It is not worse with exertion and is always the same. No change in tightness when he took NTG the other day.  Patient is wondering if there is water in his chest that is pushing on his lungs.  [name] reports patient did not drink much yesterday.  He was having decreased urine out put so they spoke with on call provider who advised he take metolazone. Patient also increased fluid intake and drank 3 glasses of water and one glass of gatorade last evening. By 7 PM his urine output increased. He had 1600 cc urine output during the night and also urinated in the toilet. Other than getting up to urinate patient slept better last night than he has been sleeping. Also took sleeping pill last night. [name] reports yesterday patient's respirations were 24-48. Today they are 20-24. She does not know normal range for patient.  She reports he became very short of breath after showering. Oxygen sat 91-94%.  He does not weigh daily. Swelling in feet has improved. Has not checked BP today but will go ahead and check now.  Patient and caregiver report chest pain and shortness of breath have not worsened since yesterday. I advised them if any of his symptoms worsen they should call 911.

## 2019-07-22 ENCOUNTER — Encounter: Payer: Self-pay | Admitting: Nurse Practitioner

## 2019-07-22 ENCOUNTER — Ambulatory Visit: Payer: Medicare Other | Admitting: Nurse Practitioner

## 2019-07-22 ENCOUNTER — Other Ambulatory Visit: Payer: Self-pay

## 2019-07-22 VITALS — BP 130/60 | HR 62 | Ht 71.0 in | Wt 231.0 lb

## 2019-07-22 DIAGNOSIS — Z950 Presence of cardiac pacemaker: Secondary | ICD-10-CM

## 2019-07-22 DIAGNOSIS — I5032 Chronic diastolic (congestive) heart failure: Secondary | ICD-10-CM

## 2019-07-22 DIAGNOSIS — Z7901 Long term (current) use of anticoagulants: Secondary | ICD-10-CM

## 2019-07-22 DIAGNOSIS — Z79899 Other long term (current) drug therapy: Secondary | ICD-10-CM

## 2019-07-22 DIAGNOSIS — I259 Chronic ischemic heart disease, unspecified: Secondary | ICD-10-CM

## 2019-07-22 DIAGNOSIS — I4819 Other persistent atrial fibrillation: Secondary | ICD-10-CM

## 2019-07-22 MED ORDER — METOLAZONE 2.5 MG PO TABS: 2.5000 mg | ORAL_TABLET | ORAL | 3 refills | Status: DC

## 2019-07-22 MED ORDER — ISOSORBIDE MONONITRATE ER 60 MG PO TB24: 60.0000 mg | ORAL_TABLET | Freq: Every day | ORAL | 3 refills | Status: DC

## 2019-07-22 NOTE — Patient Instructions (Addendum)
After Visit Summary:  We will be checking the following labs today - NONE   Medication Instructions:    Continue with your current medicines. BUT  I am restarting the Zaroxolyn to 2.5 mg to take on Sundays and Wednesdays - 1/2 hour prior to the AM dose of Lasix  I am increasing the Imdur to 60 mg a day - ok to take 2 of the 30 mg tablets and use up - the RX for the 60 mg is at the drug store.    If you need a refill on your cardiac medications before your next appointment, please call your pharmacy.     Testing/Procedures To Be Arranged:  N/A  Follow-Up:   See me as planned in June.     At [facility], you and your health needs are our priority.  As part of our continuing mission to provide you with exceptional heart care, we have created designated Provider Care Teams.  These Care Teams include your primary Cardiologist (physician) and Advanced Practice Providers (APPs -  Physician Assistants and Nurse Practitioners) who all work together to provide you with the care you need, when you need it.  Special Instructions:  . Stay safe, stay home, wash your hands for at least 20 seconds and wear a mask when out in public.  . It was good to talk with you today.    Call the [place] office at [phone] if you have any questions, problems or concerns.

## 2019-07-24 ENCOUNTER — Telehealth: Payer: Self-pay | Admitting: Nurse Practitioner

## 2019-07-24 NOTE — Telephone Encounter (Signed)
Pt c/o medication issue:  1. Name of Medication: Metolazone and Isosorbide  2. How are you currently taking this medication (dosage and times per day)? Isosorbide 1 time a day at night and Metolazone 2 times a week  3. Are you having a reaction (difficulty breathing--STAT)? no  4. What is your medication issue?  He was confused and not feeling right

## 2019-07-24 NOTE — Telephone Encounter (Signed)
Noted.   Burtis Junes, RN, [facility] [address] [place], [place]  [address] [phone]

## 2019-07-24 NOTE — Telephone Encounter (Signed)
I spoke to the patient's caregiver [name]) who called because the patient went through medication adjustments after his recent OV 5/25 with Cecille Rubin. The Imdur was increased to 60 mg Daily and Zaroxolyn 2.5 mg was directed to be taken Wednesday and Sunday.    On Wednesday, he was very "fuzzyheaded and out of it"  forgetting Kat's first name, and unsteady on his feet.  He experienced no other symptoms.   He seems to be better today, Thursday and she will continue monitoring over the next few days as he gets acclimated to the medication changes.  She unfortunately had no vitals to report and the patient was at lunch.

## 2019-07-30 ENCOUNTER — Other Ambulatory Visit: Payer: Self-pay | Admitting: Nurse Practitioner

## 2019-07-31 ENCOUNTER — Telehealth: Payer: Self-pay | Admitting: *Deleted

## 2019-07-31 ENCOUNTER — Other Ambulatory Visit: Payer: Self-pay | Admitting: *Deleted

## 2019-07-31 MED ORDER — IRBESARTAN 300 MG PO TABS: 150.0000 mg | ORAL_TABLET | Freq: Every day | ORAL | 3 refills | Status: DC

## 2019-07-31 NOTE — Telephone Encounter (Signed)
S/w pt and pt's caregiver Vaughan Basta) with pt's  consent.  There was two refills in refill pool that needed clarification.  Avapro one half tablet (150 mg).  This was not correct.  Pt has a (300 mg) tablet and is taking one half tablet at (150 mg) not 75 mg.  Sent in correctly.  Pt was prescribed imdur and metolazone at recent ov.  Pt has refused both medications.  Will send to [place] to [place].

## 2019-07-31 NOTE — Telephone Encounter (Signed)
Unclear why he is refusing the Imdur and Zaroxolyn....  Correction of Avapro noted.   Burtis Junes, RN, [facility] [address] [place], [place]  [address] [phone]

## 2019-08-07 ENCOUNTER — Encounter (INDEPENDENT_AMBULATORY_CARE_PROVIDER_SITE_OTHER): Payer: Medicare Other | Admitting: Ophthalmology

## 2019-08-07 ENCOUNTER — Other Ambulatory Visit: Payer: Self-pay

## 2019-08-07 DIAGNOSIS — H34832 Tributary (branch) retinal vein occlusion, left eye, with macular edema: Secondary | ICD-10-CM | POA: Diagnosis not present

## 2019-08-07 DIAGNOSIS — H35033 Hypertensive retinopathy, bilateral: Secondary | ICD-10-CM | POA: Diagnosis not present

## 2019-08-07 DIAGNOSIS — H353221 Exudative age-related macular degeneration, left eye, with active choroidal neovascularization: Secondary | ICD-10-CM

## 2019-08-07 DIAGNOSIS — H353112 Nonexudative age-related macular degeneration, right eye, intermediate dry stage: Secondary | ICD-10-CM

## 2019-08-07 DIAGNOSIS — I10 Essential (primary) hypertension: Secondary | ICD-10-CM | POA: Diagnosis not present

## 2019-08-07 DIAGNOSIS — H43813 Vitreous degeneration, bilateral: Secondary | ICD-10-CM

## 2019-08-09 ENCOUNTER — Other Ambulatory Visit: Payer: Self-pay | Admitting: Nurse Practitioner

## 2019-08-13 NOTE — Progress Notes (Signed)
CARDIOLOGY OFFICE NOTE  Date:  08/18/2019    [name] Date of Birth: [birth year] Medical Record #[id number]  PCP:  [name], MD  Cardiologist:  [name]   Chief Complaint  Patient presents with  . Follow-up    History of Present Illness: [name] is a 86 y.o. male who presents today for a follow up visit. Former patient of [name] -nowfollowing with me.  He has a history of chronic diastolic HF, PAF- on Coumadin, HTN, CAD with coronary calcifications on prior CT and prior cath in 2018 showing no significant disease, MV regurgitation, obesity, HLD, chronic anticoagulation, underlying PPM (for second degree AV block in 2005 in [place]) and prior SVT with prior ablation in 1988 at [facility]. Other issues include obesity, BPH, asthma, gout, hypothyroidism, & GERD.  Noted EF of 55% by echo from 10/2016.  He was lastseenby [name] 2019. He hadhad progressive shortness of breath despite escalating doses of diuretics. He hadbeen caring for his wife who was on Hospice and subsequently passed away.I then met him in [place] assumed his care. He has had to have intermittent increases in his diuretics as well as nitrates for better symptom control. He has paid live in help. Coumadin checked by Dr. Brigitte Pulse.   More recently have had to use Zaroxolyn for management of his volume overload. He has not wanted to go to the hospital. He is a DNR.   Seen as a work in last month - again with worsening volume overload. Medicines were adjusted. However, on phone call earlier this month he was not taking as prescribed - unclear why.   The patient does not have symptoms concerning for COVID-19 infection (fever, chills, cough, or new shortness of breath).   Comes in today. Here with his caregiver - [name]. She helps give the history. She is now there with him 24/7. He did not take any Zaroxolyn since he was last here nor Isosorbide - not really  clear why - but there was concern about side effects. However, he took a dose of Zaroxolyn last night - had not voided in about 24 hours - having more issues with swelling. Weight is up a pound. He has agreed to let [place] start overseeing his medicines. He has several bottles of the same medicines. Coumadin dose is not clear - they are seeing Dr. Brigitte Pulse tomorrow. He reiterates that he wants to stay home and not go to the hospital. He has what sounds like stable NTG use.   Past Medical History:  Diagnosis Date  . 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 Anticoagulant long-term use   . Asthma    until age 53   . Atrial fibrillation (HCC)     on coumadin    . Atrioventricular block, complete ([place])   . BPH (benign prostatic hyperplasia)   . Chronic combined systolic and diastolic CHF (congestive heart failure) (HCC)    EF 40% by ECHO  8/14  - followed by [name]  . Diplopia 08/14/2013  . Encounter for care or replacement of suprapubic tube ([place])   . GERD (gastroesophageal reflux disease)   . Gout   . HOH (hard of hearing)    hearing aids  . Hypertension   . Hyponatremia 2014   HCTZ  . Hypothyroid   . Lumbar disc disease   . Macular degeneration   . Obesity (BMI 30-39.9)   . Orchitis of right testicle   . Osteoporosis   .  Pacemaker-Medtronic 2005   generator change  . Sinusitis     Past Surgical History:  Procedure Laterality Date  . ABLATION  1990  . CATARACT EXTRACTION Bilateral   . CYSTOSCOPY N/A 12/11/2012   Procedure: CYSTOSCOPY;  Surgeon: [name], MD;  [facility];  Service: Urology;  Laterality: N/A;  . [place]  . INSERTION OF SUPRAPUBIC CATHETER N/A 12/11/2012   Procedure: INSERTION OF SUPRAPUBIC CATHETER " NEEDS PERC BALLOON LIKE FOR PERC KIDNEY";  Surgeon: [name], MD;  [facility];  Service: Urology;  Laterality: N/A;  . PACEMAKER GENERATOR CHANGE N/A 07/19/2011   Procedure: PACEMAKER GENERATOR CHANGE;  Surgeon: [name], MD;  Location: [facility] CATH LAB;  Service: Cardiovascular;  Laterality: N/A;  . PACEMAKER INSERTION    . ROBOT ASSISTED LAPAROSCOPIC RADICAL PROSTATECTOMY N/A 02/10/2013   Procedure: ROBOTIC ASSISTED LAPAROSCOPIC SIMPLE PROSTATECTOMY;  Surgeon: [name], MD;  [facility];  Service: Urology;  Laterality: N/A;  . SUPRPUBIC TUBE    . TRANSURETHRAL RESECTION OF PROSTATE       Medications: Current Meds  Medication Sig  . allopurinol (ZYLOPRIM) 100 MG tablet Take 100 mg by mouth daily.  . Azelastine HCl 137 MCG/SPRAY SOLN   . beta carotene w/minerals (OCUVITE) tablet Take 1 tablet by mouth at bedtime.  . bisoprolol (ZEBETA) 10 MG tablet TAKE 1 TABLET BY MOUTH  TWICE DAILY  . budesonide-formoterol (SYMBICORT) 160-4.5 MCG/ACT inhaler Inhale 2 puffs into the lungs 2 (two) times daily.  . Calcium Carbonate-Vitamin D (CALCIUM 600 + D PO) Take 1 tablet by mouth daily.  . cholecalciferol (VITAMIN D) 1000 UNITS tablet Take 2,000 Units by mouth daily.  . Colchicine 0.6 MG CAPS Take 1 capsule by mouth as needed.  [name] erythromycin ophthalmic ointment APPLY 1 CM RIBBON TO LEFT EYE 4 TIMES DAILY FOR 1 WEEK  . finasteride (PROSCAR) 5 MG tablet Take 5 mg by mouth daily.   . furosemide (LASIX) 80 MG tablet TAKE 1 TABLET BY MOUTH  TWICE DAILY  . hydrALAZINE (APRESOLINE) 50 MG tablet Take 50 mg by mouth 2 (two) times daily.  . irbesartan (AVAPRO) 150 MG tablet Take 150 mg by mouth daily. Per patient taking 1/2 tablet  . isosorbide mononitrate (IMDUR) 60 MG 24 hr tablet Take 1 tablet (60 mg total) by mouth daily.  [name] levothyroxine (SYNTHROID) 100 MCG tablet Take 100 mcg by mouth daily.  . metolazone (ZAROXOLYN) 2.5 MG tablet Take 1 tablet (2.5 mg total) by mouth as directed. To take on Sundays and Wednesdays - 1/2 hour prior to the AM dose of Furosemide  . Multiple Vitamin (MULTIVITAMIN WITH MINERALS) TABS tablet Take 1 tablet by mouth daily.  . nitroGLYCERIN (NITROSTAT) 0.4 MG SL tablet PLACE 1 TABLET  UNDER THE TONGUE EVERY 5 MINUTES AS NEEDED FOR CHEST PAIN  . omega-3 acid ethyl esters (LOVAZA) 1 G capsule Take 1 g by mouth every morning.  [name] omeprazole (PRILOSEC) 20 MG capsule Take 20 mg by mouth at bedtime.   [name] Delica Lancets 30S MISC USE TO SELF MONITOR BLOOD GLUCOSE TWICE DAILY  . ONETOUCH VERIO test strip USE TO SELF MONITOR BLOOD GLUCOSE TWICE DAILY  . simethicone (GAS-X) 80 MG chewable tablet Chew 1 tablet (80 mg total) by mouth every 6 (six) hours as needed (bloating).  . temazepam (RESTORIL) 15 MG capsule Take 15 mg by mouth daily.  . traMADol (ULTRAM) 50 MG tablet Take 1 tablet (50 mg total) by mouth every  6 (six) hours as needed for moderate pain. Post-operatively.  . warfarin (COUMADIN) 3 MG tablet Take 1.5-3 mg by mouth daily. Takes 0.5 tablet (1.5 mg) on Wednesday and Saturday and Take 1 tablet (3 mg) on all other days.  . [DISCONTINUED] irbesartan (AVAPRO) 300 MG tablet Take 0.5 tablets (150 mg total) by mouth daily.  . [DISCONTINUED] isosorbide mononitrate (IMDUR) 60 MG 24 hr tablet Take 1 tablet (60 mg total) by mouth daily.     Allergies: Allergies  Allergen Reactions  . Hctz [Hydrochlorothiazide]     Hyponatremia     Social History: The patient  reports that he quit smoking about 78 years ago. His smoking use included cigarettes. He has a 2.00 pack-year smoking history. He has never used smokeless tobacco. He reports that he does not drink alcohol and does not use drugs.   Family History: The patient's family history includes Deep vein thrombosis in his sister; Heart disease in his mother; Rheum arthritis in his mother.   Review of Systems: Please see the history of present illness.   All other systems are reviewed and negative.   Physical Exam: VS:  BP 120/60   Pulse 60   Ht _0  (1.803 m)   Wt 230 lb 12.8 oz (104.7 kg)   SpO2 96%   BMI 32.19 kg/m  .  BMI Body mass index is 32.19 kg/m.  Wt Readings from Last 3 Encounters:  08/18/19 230 lb  12.8 oz (104.7 kg)  07/22/19 231 lb (104.8 kg)  06/03/19 225 lb (102.1 kg)    General: Pleasant. Elderly. Alert and in no acute distress. He is in a wheelchair.  Cardiac: Irregular irregular rhythm. Rate is ok. No murmurs, rubs, or gallops. 1+ edema.  Respiratory:  Lungs are clear to auscultation bilaterally with normal work of breathing.  GI: Soft and nontender.  MS: No deformity or atrophy. Gait not tested. He is in a wheelchair.  Skin: Warm and dry. Color is normal.  Neuro:  Strength and sensation are intact and no gross focal deficits noted.  Psych: Alert, appropriate and with normal affect.   LABORATORY DATA:  EKG:  EKG is not ordered today.   Lab Results  Component Value Date   WBC 10.9 (H) 06/19/2019   HGB 12.4 (L) 06/19/2019   HCT 37.5 (L) 06/19/2019   PLT 214 06/19/2019   GLUCOSE 194 (H) 06/19/2019   ALT 26 06/19/2019   AST 25 06/19/2019   NA 131 (L) 06/19/2019   K 3.7 06/19/2019   CL 91 (L) 06/19/2019   CREATININE 1.84 (H) 06/19/2019   BUN 40 (H) 06/19/2019   CO2 26 06/19/2019   TSH 4.750 (H) 09/11/2018   INR 1.87 10/06/2016   HGBA1C 6.6 (H) 11/07/2012       BNP (last 3 results) No results for input(s): BNP in the last 8760 hours.  ProBNP (last 3 results) No results for input(s): PROBNP in the last 8760 hours.   Other Studies Reviewed Today:   Assessment/Plan:  1. Chronic diastolic HF - he is agreeable now to going back on bi-weekly Zaroxolyn - Sundays and Wednesdays. Lab today.   2. Chest pain/presumed CAD - restarting his Imdur today at 60 mg a day.   3. Underlying PPM  4. Persistent Af - rate is ok - he is on Coumadin which is managed by PCP  5. Advanced age - some memory issues - he is agreeable to letting [name] help oversee his medicines - I think there has  been and is potential for medication error.   6. HTN - BP is ok here today.   7. DNR  Current medicines are reviewed with the patient today.  The patient does not have concerns  regarding medicines other than what has been noted above.  The following changes have been made:  See above.  Labs/ tests ordered today include:    Orders Placed This Encounter  Procedures  . Basic metabolic panel     Disposition:   FU with me in about 2 months.      Patient is agreeable to this plan and will call if any problems develop in the interim.   SignedTruitt Merle, NP  08/18/2019 3:24 PM  [facility] [address] [place],   [address] Phone: [phone] Fax: [phone]

## 2019-08-18 ENCOUNTER — Encounter: Payer: Self-pay | Admitting: Nurse Practitioner

## 2019-08-18 ENCOUNTER — Ambulatory Visit: Payer: Medicare Other | Admitting: Nurse Practitioner

## 2019-08-18 ENCOUNTER — Other Ambulatory Visit: Payer: Self-pay

## 2019-08-18 VITALS — BP 120/60 | HR 60 | Ht 71.0 in | Wt 230.8 lb

## 2019-08-18 DIAGNOSIS — Z79899 Other long term (current) drug therapy: Secondary | ICD-10-CM

## 2019-08-18 DIAGNOSIS — I4819 Other persistent atrial fibrillation: Secondary | ICD-10-CM | POA: Diagnosis not present

## 2019-08-18 DIAGNOSIS — I259 Chronic ischemic heart disease, unspecified: Secondary | ICD-10-CM

## 2019-08-18 DIAGNOSIS — Z950 Presence of cardiac pacemaker: Secondary | ICD-10-CM

## 2019-08-18 DIAGNOSIS — I5032 Chronic diastolic (congestive) heart failure: Secondary | ICD-10-CM

## 2019-08-18 MED ORDER — ISOSORBIDE MONONITRATE ER 60 MG PO TB24: 60.0000 mg | ORAL_TABLET | Freq: Every day | ORAL | 3 refills | Status: DC

## 2019-08-18 NOTE — Patient Instructions (Signed)
After Visit Summary:  We will be checking the following labs today - BMET   Medication Instructions:    Continue with your current medicines.  I am going to send the RX for the Isosorbide to [facility]   If you need a refill on your cardiac medications before your next appointment, please call your pharmacy.     Testing/Procedures To Be Arranged:  N/A  Follow-Up:   See me in about 2 months    At [facility], you and your health needs are our priority.  As part of our continuing mission to provide you with exceptional heart care, we have created designated Provider Care Teams.  These Care Teams include your primary Cardiologist (physician) and Advanced Practice Providers (APPs -  Physician Assistants and Nurse Practitioners) who all work together to provide you with the care you need, when you need it.  Special Instructions:  . Stay safe, wash your hands for at least 20 seconds and wear a mask when needed.  . It was good to talk with you today.    Call the [place] office at [phone] if you have any questions, problems or concerns.

## 2019-08-19 ENCOUNTER — Ambulatory Visit: Payer: Medicare Other | Admitting: Nurse Practitioner

## 2019-08-19 LAB — BASIC METABOLIC PANEL
BUN/Creatinine Ratio: 20 (ref 10–24)
BUN: 38 mg/dL — ABNORMAL HIGH (ref 10–36)
CO2: 28 mmol/L (ref 20–29)
Calcium: 9.7 mg/dL (ref 8.6–10.2)
Chloride: 90 mmol/L — ABNORMAL LOW (ref 96–106)
Creatinine, Ser: 1.86 mg/dL — ABNORMAL HIGH (ref 0.76–1.27)
GFR calc Af Amer: 35 mL/min/{1.73_m2} — ABNORMAL LOW (ref >59)
GFR calc non Af Amer: 30 mL/min/{1.73_m2} — ABNORMAL LOW (ref >59)
Glucose: 165 mg/dL — ABNORMAL HIGH (ref 65–99)
Potassium: 4.1 mmol/L (ref 3.5–5.2)
Sodium: 132 mmol/L — ABNORMAL LOW (ref 134–144)

## 2019-08-26 ENCOUNTER — Ambulatory Visit (INDEPENDENT_AMBULATORY_CARE_PROVIDER_SITE_OTHER): Payer: Medicare Other | Admitting: *Deleted

## 2019-08-26 DIAGNOSIS — I4891 Unspecified atrial fibrillation: Secondary | ICD-10-CM | POA: Diagnosis not present

## 2019-08-27 LAB — CUP PACEART REMOTE DEVICE CHECK
Battery Impedance: 1160 Ohm
Battery Remaining Longevity: 52 mo
Battery Voltage: 2.77 V
Brady Statistic AP VP Percent: 71 %
Brady Statistic AP VS Percent: 0 %
Brady Statistic AS VP Percent: 29 %
Brady Statistic AS VS Percent: 0 %
Date Time Interrogation Session: 20210629171112
Implantable Lead Implant Date: 20050322
Implantable Lead Implant Date: 20050322
Implantable Lead Location: 753859
Implantable Lead Location: 753860
Implantable Lead Model: 4092
Implantable Lead Model: 5568
Implantable Pulse Generator Implant Date: 20130522
Lead Channel Impedance Value: 604 Ohm
Lead Channel Impedance Value: 639 Ohm
Lead Channel Pacing Threshold Amplitude: 0.625 V
Lead Channel Pacing Threshold Amplitude: 1.25 V
Lead Channel Pacing Threshold Pulse Width: 0.4 ms
Lead Channel Pacing Threshold Pulse Width: 0.4 ms
Lead Channel Setting Pacing Amplitude: 2.5 V
Lead Channel Setting Pacing Amplitude: 2.5 V
Lead Channel Setting Pacing Pulse Width: 0.4 ms
Lead Channel Setting Sensing Sensitivity: 2 mV

## 2019-08-28 NOTE — Progress Notes (Signed)
Remote pacemaker transmission.   

## 2019-09-04 ENCOUNTER — Encounter (INDEPENDENT_AMBULATORY_CARE_PROVIDER_SITE_OTHER): Payer: Medicare Other | Admitting: Ophthalmology

## 2019-09-11 ENCOUNTER — Telehealth: Payer: Self-pay

## 2019-09-11 ENCOUNTER — Encounter (INDEPENDENT_AMBULATORY_CARE_PROVIDER_SITE_OTHER): Payer: Medicare Other | Admitting: Ophthalmology

## 2019-09-11 ENCOUNTER — Other Ambulatory Visit: Payer: Self-pay

## 2019-09-11 DIAGNOSIS — H43813 Vitreous degeneration, bilateral: Secondary | ICD-10-CM

## 2019-09-11 DIAGNOSIS — I10 Essential (primary) hypertension: Secondary | ICD-10-CM | POA: Diagnosis not present

## 2019-09-11 DIAGNOSIS — H35033 Hypertensive retinopathy, bilateral: Secondary | ICD-10-CM

## 2019-09-11 DIAGNOSIS — H353112 Nonexudative age-related macular degeneration, right eye, intermediate dry stage: Secondary | ICD-10-CM

## 2019-09-11 DIAGNOSIS — H353221 Exudative age-related macular degeneration, left eye, with active choroidal neovascularization: Secondary | ICD-10-CM

## 2019-09-11 DIAGNOSIS — H34832 Tributary (branch) retinal vein occlusion, left eye, with macular edema: Secondary | ICD-10-CM | POA: Diagnosis not present

## 2019-09-11 NOTE — Telephone Encounter (Signed)
Dr Vaughan Browner had opening 09/16/19 so I have scheduled him for then  Nothing further needed

## 2019-09-11 NOTE — Telephone Encounter (Signed)
Kat phone number is [phone] c.

## 2019-09-12 ENCOUNTER — Telehealth: Payer: Self-pay | Admitting: Nurse Practitioner

## 2019-09-12 NOTE — Telephone Encounter (Signed)
LMTCB

## 2019-09-12 NOTE — Telephone Encounter (Signed)
Patient's at-home caregiver, Cat, is requesting to make Truitt Merle aware of patient's glucose. She states his glucose is 199. His oxygen is 95. BP is normal. Pain has been cold. However, patient does not report any pain or additional symptoms. Cat states she gave the patient an additional dose of Lasix and metolazone (ZAROXOLYN) 2.5 MG tablet.

## 2019-09-15 NOTE — Telephone Encounter (Signed)
Noted - would continue with our current regimen as outlined at prior visit.   [name]

## 2019-09-15 NOTE — Telephone Encounter (Signed)
S/w caregive Cat pt's weights are as follows: Mon   222 Tues 222 Wed   215 Thurs  220 Fri       225  Cat gave pt extra lasix on Tues, unclear why. Pt's bp is ok.   Pt's left foot and leg still swollen, elevates when pt can. Cat is to  keep pt on medication regimen currently discussed at last ov with Truitt Merle, NP unless advised otherwise.  Pt does still eat out quite often.  Cat did take over pt's medications.  Cat stated pt's phlegm is coming back, pt is seeing pulmonology on Tuesday.  Cat stated glucose is elevated stated to discuss with PCP, Cat stated already left a message for PCP. Will send to [place] to [place].

## 2019-09-16 ENCOUNTER — Encounter: Payer: Self-pay | Admitting: Pulmonary Disease

## 2019-09-16 ENCOUNTER — Ambulatory Visit (INDEPENDENT_AMBULATORY_CARE_PROVIDER_SITE_OTHER)
Admission: RE | Admit: 2019-09-16 | Discharge: 2019-09-16 | Disposition: A | Discharge status: Home / Self Care | Payer: Medicare Other | Source: Ambulatory Visit | Attending: Pulmonary Disease | Admitting: Pulmonary Disease

## 2019-09-16 ENCOUNTER — Ambulatory Visit: Payer: Medicare Other | Admitting: Pulmonary Disease

## 2019-09-16 ENCOUNTER — Telehealth: Payer: Self-pay | Admitting: Pulmonary Disease

## 2019-09-16 ENCOUNTER — Other Ambulatory Visit: Payer: Self-pay

## 2019-09-16 VITALS — BP 112/58 | HR 60 | Temp 97.9°F | Ht 72.0 in | Wt 229.8 lb

## 2019-09-16 DIAGNOSIS — R0600 Dyspnea, unspecified: Secondary | ICD-10-CM | POA: Diagnosis not present

## 2019-09-16 DIAGNOSIS — R0609 Other forms of dyspnea: Secondary | ICD-10-CM

## 2019-09-16 LAB — CBC WITH DIFFERENTIAL/PLATELET
Basophils Absolute: 0.1 10*3/uL (ref 0.0–0.1)
Basophils Relative: 1.3 % (ref 0.0–3.0)
Eosinophils Absolute: 0.1 10*3/uL (ref 0.0–0.7)
Eosinophils Relative: 0.8 % (ref 0.0–5.0)
HCT: 31.7 % — ABNORMAL LOW (ref 39.0–52.0)
Hemoglobin: 11 g/dL — ABNORMAL LOW (ref 13.0–17.0)
Lymphocytes Relative: 14.9 % (ref 12.0–46.0)
Lymphs Abs: 1.2 10*3/uL (ref 0.7–4.0)
MCHC: 34.8 g/dL (ref 30.0–36.0)
MCV: 92.5 fl (ref 78.0–100.0)
Monocytes Absolute: 0.6 10*3/uL (ref 0.1–1.0)
Monocytes Relative: 7.9 % (ref 3.0–12.0)
Neutro Abs: 6.1 10*3/uL (ref 1.4–7.7)
Neutrophils Relative %: 75.1 % (ref 43.0–77.0)
Platelets: 144 10*3/uL — ABNORMAL LOW (ref 150.0–400.0)
RBC: 3.43 Mil/uL — ABNORMAL LOW (ref 4.22–5.81)
RDW: 14 % (ref 11.5–15.5)
WBC: 8.1 10*3/uL (ref 4.0–10.5)

## 2019-09-16 MED ORDER — PREDNISONE 10 MG PO TABS: ORAL_TABLET | ORAL | 0 refills | Status: DC

## 2019-09-16 MED ORDER — SODIUM CHLORIDE 3 % IN NEBU: INHALATION_SOLUTION | Freq: Two times a day (BID) | RESPIRATORY_TRACT | 12 refills | Status: AC

## 2019-09-16 MED ORDER — FLUTICASONE PROPIONATE 50 MCG/ACT NA SUSP: 2.0000 | Freq: Every day | NASAL | 2 refills | Status: DC

## 2019-09-16 MED ORDER — AZITHROMYCIN 250 MG PO TABS: 250.0000 mg | ORAL_TABLET | ORAL | 0 refills | Status: DC

## 2019-09-16 NOTE — Progress Notes (Signed)
[name]    [id number]    1922-06-13  Primary Care Physician:Shaw, Gwyndolyn Saxon, MD  Referring Physician: [name], MD [address] [place],  [place] [address]  Chief complaint: Consult for dyspnea, asthma  HPI: 86 year old with history of atrial fibrillation, asthma, GERD.  Previously followed in pulmonary clinic for asthma, maintained on Symbicort.  Has not been seen in clinic since 2010.  Complains of cough with congestion for the past several weeks.  He was placed on prednisone 40 mg/day for 5 days by Dr. Brigitte Beard his primary care doctor with temporary improvement in symptoms.  Continues on Symbicort inhaler Complains of chest congestion, feels like mucus is stuck in the middle of his chest and unable to come out.  Denies any fevers, chills. Also complains of nasal congestion.  He is using Afrin regularly with minimal improvement in symptoms.  Pets: Has a dog Occupation: Used to run a Psychologist, occupational Exposures: No known exposures.  No mold, hot tub, Jacuzzi Smoking history: Minimal smoking history in youth Travel history: No significant travel history Relevant family history: No significant family history of lung disease   Outpatient Encounter Medications as of 09/16/2019  Medication Sig   allopurinol (ZYLOPRIM) 100 MG tablet Take 100 mg by mouth daily.   apixaban (ELIQUIS) 2.5 MG TABS tablet Take 2.5 mg by mouth 2 (two) times daily.   Azelastine HCl 137 MCG/SPRAY SOLN    beta carotene w/minerals (OCUVITE) tablet Take 1 tablet by mouth at bedtime.   bisoprolol (ZEBETA) 10 MG tablet TAKE 1 TABLET BY MOUTH  TWICE DAILY   budesonide-formoterol (SYMBICORT) 160-4.5 MCG/ACT inhaler Inhale 2 puffs into the lungs 2 (two) times daily.   Calcium Carbonate-Vitamin D (CALCIUM 600 + D PO) Take 1 tablet by mouth daily.   cholecalciferol (VITAMIN D) 1000 UNITS tablet Take 2,000 Units by mouth daily.   Colchicine 0.6 MG CAPS Take 1 capsule by mouth as needed.     erythromycin ophthalmic ointment APPLY 1 CM RIBBON TO LEFT EYE 4 TIMES DAILY FOR 1 WEEK   finasteride (PROSCAR) 5 MG tablet Take 5 mg by mouth daily.    furosemide (LASIX) 80 MG tablet TAKE 1 TABLET BY MOUTH  TWICE DAILY   hydrALAZINE (APRESOLINE) 50 MG tablet Take 50 mg by mouth 2 (two) times daily.   irbesartan (AVAPRO) 150 MG tablet Take 150 mg by mouth daily. Per patient taking 1/2 tablet   isosorbide mononitrate (IMDUR) 60 MG 24 hr tablet Take 1 tablet (60 mg total) by mouth daily.   levothyroxine (SYNTHROID) 112 MCG tablet Take 112 mcg by mouth daily before breakfast.   metolazone (ZAROXOLYN) 2.5 MG tablet Take 1 tablet (2.5 mg total) by mouth as directed. To take on Sundays and Wednesdays - 1/2 hour prior to the AM dose of Furosemide   Multiple Vitamin (MULTIVITAMIN WITH MINERALS) TABS tablet Take 1 tablet by mouth daily.   nitroGLYCERIN (NITROSTAT) 0.4 MG SL tablet PLACE 1 TABLET UNDER THE TONGUE EVERY 5 MINUTES AS NEEDED FOR CHEST PAIN   omega-3 acid ethyl esters (LOVAZA) 1 G capsule Take 1 g by mouth every morning.   omeprazole (PRILOSEC) 20 MG capsule Take 20 mg by mouth at bedtime.    OneTouch Delica Lancets 46N MISC USE TO SELF MONITOR BLOOD GLUCOSE TWICE DAILY   ONETOUCH VERIO test strip USE TO SELF MONITOR BLOOD GLUCOSE TWICE DAILY   simethicone (GAS-X) 80 MG chewable tablet Chew 1 tablet (80 mg total)  by mouth every 6 (six) hours as needed (bloating).   temazepam (RESTORIL) 15 MG capsule Take 15 mg by mouth daily.   traMADol (ULTRAM) 50 MG tablet Take 1 tablet (50 mg total) by mouth every 6 (six) hours as needed for moderate pain. Post-operatively.   [DISCONTINUED] levothyroxine (SYNTHROID) 100 MCG tablet Take 100 mcg by mouth daily.   [DISCONTINUED] warfarin (COUMADIN) 3 MG tablet Take 1.5-3 mg by mouth daily. Takes 0.5 tablet (1.5 mg) on Wednesday and Saturday and Take 1 tablet (3 mg) on all other days.   No facility-administered encounter medications on  file as of 09/16/2019.    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   Diplopia 08/14/2013   Encounter for care or replacement of suprapubic tube ([place])    GERD (gastroesophageal reflux disease)    Gout    HOH (hard of hearing)    hearing aids   Hypertension    Hyponatremia 2014   HCTZ   Hypothyroid    Lumbar disc disease    Macular degeneration    Obesity (BMI 30-39.9)    Orchitis of right testicle    Osteoporosis    Pacemaker-Medtronic 2005   generator change   Sinusitis     Past Surgical History:  Procedure Laterality Date   ABLATION  1990   CATARACT EXTRACTION Bilateral    CYSTOSCOPY N/A 12/11/2012   Procedure: CYSTOSCOPY;  Surgeon: [name], MD;  [facility];  Service: Urology;  Laterality: N/A;   INGUINAL HERNIA REPAIR  1969   INSERTION OF SUPRAPUBIC CATHETER N/A 12/11/2012   Procedure: INSERTION OF SUPRAPUBIC CATHETER " NEEDS PERC BALLOON LIKE FOR PERC KIDNEY";  Surgeon: [name], MD;  [facility];  Service: Urology;  Laterality: N/A;   PACEMAKER GENERATOR CHANGE N/A 07/19/2011   Procedure: PACEMAKER GENERATOR CHANGE;  Surgeon: [name], MD;  Location: [facility] CATH LAB;  Service: Cardiovascular;  Laterality: N/A;   PACEMAKER INSERTION     ROBOT ASSISTED LAPAROSCOPIC RADICAL PROSTATECTOMY N/A 02/10/2013   Procedure: ROBOTIC ASSISTED LAPAROSCOPIC SIMPLE PROSTATECTOMY;  Surgeon: [name], MD;  [facility];  Service: Urology;   Laterality: N/A;   SUPRPUBIC TUBE     TRANSURETHRAL RESECTION OF PROSTATE      Family History  Problem Relation Age of Onset   Heart disease Mother    Rheum arthritis Mother    Deep vein thrombosis Sister     Social History   Socioeconomic History   Marital status: Married    Spouse name: Not on file   Number of children: 2   Years of education: hs   Highest education level: Not on file  Occupational History   Occupation: retired. owns and prev worked at [employer] Use   Smoking status: Former Smoker    Packs/day: 1.00    Years: 2.00    Pack years: 2.00    Types: Cigarettes  Quit date: 07/17/1941    Years since quitting: 78.2   Smokeless tobacco: Never Used  Vaping Use   Vaping Use: Never used  Substance and Sexual Activity   Alcohol use: No   Drug use: No   Sexual activity: Not on file  Other Topics Concern   Not on file  Social History Narrative   Re-Married [name]).  1st wife deceased.  1 son, 1 daughter, 5 grandchildren.   High school education.  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   Social Determinants of Health   Financial Resource Strain:    Difficulty of Paying Living Expenses:   Food Insecurity:    Worried About Charity fundraiser in the Last Year:    Arboriculturist in the Last Year:   Transportation Needs:    Film/video editor (Medical):    Lack of Transportation (Non-Medical):   Physical Activity:    Days of Exercise per Week:    Minutes of Exercise per Session:   Stress:    Feeling of Stress :   Social Connections:    Frequency of Communication with Friends and Family:    Frequency of Social Gatherings with Friends and Family:    Attends Religious Services:    Active Member of Clubs or Organizations:    Attends Music therapist:    Marital Status:   Intimate Partner Violence:    Fear of Current or Ex-Partner:    Emotionally Abused:    Physically Abused:    Sexually  Abused:     Review of systems: Review of Systems  Constitutional: Negative for fever and chills.  HENT: Negative.   Eyes: Negative for blurred vision.  Respiratory: as per HPI  Cardiovascular: Negative for chest pain and palpitations.  Gastrointestinal: Negative for vomiting, diarrhea, blood per rectum. Genitourinary: Negative for dysuria, urgency, frequency and hematuria.  Musculoskeletal: Negative for myalgias, back pain and joint pain.  Skin: Negative for itching and rash.  Neurological: Negative for dizziness, tremors, focal weakness, seizures and loss of consciousness.  Endo/Heme/Allergies: Negative for environmental allergies.  Psychiatric/Behavioral: Negative for depression, suicidal ideas and hallucinations.  All other systems reviewed and are negative.  Physical Exam: Blood pressure (!) 112/58, Beard 60, temperature 97.9 F (36.6 C), temperature source Oral, height 6' (1.829 m), weight 229 lb 12.8 oz (104.2 kg), SpO2 96 %. Gen:      No acute distress HEENT:  EOMI, sclera anicteric Neck:     No masses; no thyromegaly Lungs:    Clear to auscultation bilaterally; normal respiratory effort CV:         Regular rate and rhythm; no murmurs Abd:      + bowel sounds; soft, non-tender; no palpable masses, no distension Ext:    No edema; adequate peripheral perfusion Skin:      Warm and dry; no rash Neuro: alert and oriented x 3 Psych: normal mood and affect  Data Reviewed: Imaging: Chest x-ray 11/13/2017-pacemaker in place.  No acute cardiopulmonary disease. I have reviewed the images personally.  PFTs:  Labs: CBC 06/19/2019-WBC 10 1 9, eos 0%  Assessment:  Asthma Has been maintained on Symbicort for many years.  Now with mild worsening in dyspnea, chest congestion He has received a course of steroids earlier this month with persistent symptoms Get a chest x-ray today.  Check CBC with differential, IgE Give Z-Pak and prednisone 40 mg a day for 4 days.  Advised to monitor  his blood sugars while on steroids  For chest congestion we will start him on saline nebs twice a day and flutter valve  Nasal congestion Advised him to stop Afrin and start Flonase nasal spray.  Plan/Recommendations: Continue Symbicort CBC with differential, IgE, chest x-ray Z-Pak, prednisone Flonase  Marshell Garfinkel MD [facility] 09/16/2019, 12:13 PM  CC: [name], MD

## 2019-09-16 NOTE — Telephone Encounter (Signed)
Spoke with [employer] Caregiver informed that patient can use flutter valve 2-3 times a day do it about 10 times each time. Patient was given nebulizer medication today encouraged for patient to use flutter valve after breathing treatment to help get up mucus. She asked how often to use nebulizer machine. Per AVS note it says: We will give her nebulizer with saline nebs twice daily and flutter valve for clearance of secretion. She expressed understanding. Nothing further needed at this time.

## 2019-09-16 NOTE — Patient Instructions (Signed)
We will get a chest x-ray today Check CBC differential, IgE Continue Symbicort  Given Z-Pak and prednisone 40 mg a day for 5 days We will give her nebulizer with saline nebs twice daily and flutter valve for clearance of secretion Flonase nasal spray for nasal congestion  Follow-up in 3 months.

## 2019-09-17 ENCOUNTER — Telehealth: Payer: Self-pay | Admitting: Pulmonary Disease

## 2019-09-17 LAB — IGE: IgE (Immunoglobulin E), Serum: 135 kU/L — ABNORMAL HIGH (ref <=114)

## 2019-09-17 NOTE — Telephone Encounter (Signed)
Returned call to pharmacy after consulting with PM. Day 1 2 tabs 1 tab day 2-5 Per pharmacist the will notify patient/family. Nothing further needed.

## 2019-09-18 ENCOUNTER — Other Ambulatory Visit: Payer: Self-pay

## 2019-09-18 MED ORDER — IRBESARTAN 150 MG PO TABS: 150.0000 mg | ORAL_TABLET | Freq: Every day | ORAL | 1 refills | Status: DC

## 2019-09-18 NOTE — Addendum Note (Signed)
Addended by: Carter Kitten D on: 09/18/2019 01:49 PM   Modules accepted: Orders

## 2019-09-29 ENCOUNTER — Other Ambulatory Visit: Payer: Self-pay | Admitting: Pulmonary Disease

## 2019-09-29 DIAGNOSIS — R0609 Other forms of dyspnea: Secondary | ICD-10-CM

## 2019-10-03 ENCOUNTER — Telehealth: Payer: Self-pay | Admitting: Pulmonary Disease

## 2019-10-03 NOTE — Telephone Encounter (Signed)
Spoke with pt's caregiver, [name]. She is calling on behalf of the pt, he is wanting to know if he can use Flonase more than what it's prescribed for. Pt is waking up in the middle of the night feeling "dry" and is using Flonase to help. Mickel Baas that it should be used a prescribed and to advise the pt that Flonase is not for immediate relief of nasal congestion. Advised her that the pt could use saline nasal spray to help when needed as Dr. Vaughan Browner doesn't want him using Afrin. Kat verbalized understanding. Nothing further was needed.

## 2019-10-09 ENCOUNTER — Encounter (INDEPENDENT_AMBULATORY_CARE_PROVIDER_SITE_OTHER): Payer: Medicare Other | Admitting: Ophthalmology

## 2019-10-10 ENCOUNTER — Other Ambulatory Visit: Payer: Self-pay | Admitting: Nurse Practitioner

## 2019-10-13 ENCOUNTER — Other Ambulatory Visit: Payer: Self-pay | Admitting: Nurse Practitioner

## 2019-10-13 NOTE — Progress Notes (Signed)
CARDIOLOGY OFFICE NOTE  Date:  10/21/2019    [name] Date of Birth: Jul 06, 1922 Medical Record #[id number]  PCP:  [name], MD  Cardiologist:  [name]    Chief Complaint  Patient presents with  . Follow-up    History of Present Illness: [name] is a 86 y.o. male who presents today for a follow up visit. Former patient of [name] -nowfollowing with me.  He has a history of chronic diastolic HF, PAF- on Coumadin, HTN, CAD with coronary calcifications on prior CT and prior cath in 2018 showing no significant disease, MV regurgitation, obesity, HLD, chronic anticoagulation, underlying PPM (for second degree AV block in 2005 in [place]) and prior SVT with prior ablation in 1988 at [facility] [place]. Other issues include obesity, BPH, asthma, gout, hypothyroidism, & GERD.  Noted EF of 55% by echo from 10/2016.  He was lastseenby [name] 2019. He hadhad progressive shortness of breath despite escalating doses of diuretics. He hadbeen caring for his wife who was on Hospice and subsequently passed away.I then met him in [place] assumed his care. He has had to have intermittent increases in his diuretics as well as nitrates for better symptom control. He has paid live in help. Coumadin checked by Dr. Brigitte Pulse.   More recently have had to use Zaroxolyn for management of his volume overload. He has not wanted to go to the hospital. He is a DNR. We have had to see him as a work in a few times for volume overload. Medicines have been a little hard to discern - he had been wanting to dose his own and not have oversight. He has a live in caregiver - [name].   Comes in today. Here with his caregiver [name]. She augments the history. He turned 89 earlier this month. They had a party at the store for him. He now has a scooter and a new van. This has helped him and he doesn't have as many stairs to maneuver - he has had ramps put in. Still walking  while in his house. No chest pain/arm pain. Says his breathing is ok. Notes less appetite. Has good days/bad days. Will sleep for long periods. BP has been good overall. Labs noted off [place]. Overall, seems to be holding his own.   Past Medical History:  Diagnosis Date  . 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 Anticoagulant long-term use   . Asthma    until age 6   . Atrial fibrillation (HCC)     on coumadin    . Atrioventricular block, complete ([place])   . BPH (benign prostatic hyperplasia)   . Chronic combined systolic and diastolic CHF (congestive heart failure) (HCC)    EF 40% by ECHO  8/14  - followed by [name]  . Diplopia 08/14/2013  . Encounter for care or replacement of suprapubic tube ([place])   . GERD (gastroesophageal reflux disease)   . Gout   . HOH (hard of hearing)    hearing aids  . Hypertension   . Hyponatremia 2014   HCTZ  . Hypothyroid   . Lumbar disc disease   . Macular degeneration   . Obesity (BMI 30-39.9)   . Orchitis of right testicle   . Osteoporosis   . Pacemaker-Medtronic 2005   generator change  . Sinusitis     Past Surgical History:  Procedure Laterality Date  . ABLATION  1990  . CATARACT EXTRACTION Bilateral   .  CYSTOSCOPY N/A 12/11/2012   Procedure: CYSTOSCOPY;  Surgeon: [name], MD;  Location: [facility];  Service: Urology;  Laterality: N/A;  . [name]  . INSERTION OF SUPRAPUBIC CATHETER N/A 12/11/2012   Procedure: INSERTION OF SUPRAPUBIC CATHETER " NEEDS PERC BALLOON LIKE FOR PERC KIDNEY";  Surgeon: [name], MD;  Location: [facility];  Service: Urology;  Laterality: N/A;  . PACEMAKER GENERATOR CHANGE N/A 07/19/2011   Procedure: PACEMAKER GENERATOR CHANGE;  Surgeon: [name], MD;  Location: [facility] CATH LAB;  Service: Cardiovascular;  Laterality: N/A;  . PACEMAKER INSERTION    . ROBOT ASSISTED LAPAROSCOPIC RADICAL PROSTATECTOMY N/A 02/10/2013   Procedure: ROBOTIC ASSISTED LAPAROSCOPIC SIMPLE PROSTATECTOMY;   Surgeon: [name], MD;  Location: [facility];  Service: Urology;  Laterality: N/A;  . SUPRPUBIC TUBE    . TRANSURETHRAL RESECTION OF PROSTATE       Medications: Current Meds  Medication Sig  . allopurinol (ZYLOPRIM) 100 MG tablet Take 100 mg by mouth daily.  [name] apixaban (ELIQUIS) 2.5 MG TABS tablet Take 2.5 mg by mouth 2 (two) times daily.  . beta carotene w/minerals (OCUVITE) tablet Take 1 tablet by mouth at bedtime.  . bisoprolol (ZEBETA) 10 MG tablet TAKE 1 TABLET BY MOUTH  TWICE DAILY  . budesonide-formoterol (SYMBICORT) 160-4.5 MCG/ACT inhaler Inhale 2 puffs into the lungs 2 (two) times daily.  . Calcium Carbonate-Vitamin D (CALCIUM 600 + D PO) Take 1 tablet by mouth daily.  . cholecalciferol (VITAMIN D) 1000 UNITS tablet Take 2,000 Units by mouth daily.  . Colchicine 0.6 MG CAPS Take 1 capsule by mouth as needed.  . finasteride (PROSCAR) 5 MG tablet Take 5 mg by mouth daily.   . fluticasone (FLONASE) 50 MCG/ACT nasal spray Place 2 sprays into both nostrils daily.  . furosemide (LASIX) 80 MG tablet TAKE 1 TABLET BY MOUTH  TWICE DAILY  . hydrALAZINE (APRESOLINE) 50 MG tablet Take 50 mg by mouth 2 (two) times daily.  . irbesartan (AVAPRO) 150 MG tablet Take 1 tablet (150 mg total) by mouth daily.  . isosorbide mononitrate (IMDUR) 60 MG 24 hr tablet Take 1 tablet (60 mg total) by mouth daily.  [name] levothyroxine (SYNTHROID) 112 MCG tablet Take 112 mcg by mouth daily before breakfast.  . Multiple Vitamin (MULTIVITAMIN WITH MINERALS) TABS tablet Take 1 tablet by mouth daily.  . nitroGLYCERIN (NITROSTAT) 0.4 MG SL tablet PLACE 1 TABLET UNDER THE TONGUE EVERY 5 MINUTES AS NEEDED FOR CHEST PAIN  . omega-3 acid ethyl esters (LOVAZA) 1 G capsule Take 1 g by mouth every morning.  [name] omeprazole (PRILOSEC) 20 MG capsule Take 20 mg by mouth at bedtime.   [name] Delica Lancets 34H MISC USE TO SELF MONITOR BLOOD GLUCOSE TWICE DAILY  . ONETOUCH VERIO test strip USE TO SELF MONITOR BLOOD GLUCOSE  TWICE DAILY  . simethicone (GAS-X) 80 MG chewable tablet Chew 1 tablet (80 mg total) by mouth every 6 (six) hours as needed (bloating).  . sodium chloride HYPERTONIC 3 % nebulizer solution Take by nebulization in the morning and at bedtime.  . temazepam (RESTORIL) 15 MG capsule Take 15 mg by mouth daily.  . traMADol (ULTRAM) 50 MG tablet Take 1 tablet (50 mg total) by mouth every 6 (six) hours as needed for moderate pain. Post-operatively.     Allergies: Allergies  Allergen Reactions  . Hctz [Hydrochlorothiazide]     Hyponatremia     Social History: The patient  reports that he quit smoking about  78 years ago. His smoking use included cigarettes. He has a 2.00 pack-year smoking history. He has never used smokeless tobacco. He reports that he does not drink alcohol and does not use drugs.   Family History: The patient's family history includes Deep vein thrombosis in his sister; Heart disease in his mother; Rheum arthritis in his mother.   Review of Systems: Please see the history of present illness.   All other systems are reviewed and negative.   Physical Exam: VS:  BP 130/60   Pulse (!) 59   Ht 5' 9.5" (1.765 m)   Wt 221 lb 3.2 oz (100.3 kg)   SpO2 99%   BMI 32.20 kg/m  .  BMI Body mass index is 32.2 kg/m.  Wt Readings from Last 3 Encounters:  10/21/19 221 lb 3.2 oz (100.3 kg)  09/16/19 229 lb 12.8 oz (104.2 kg)  08/18/19 230 lb 12.8 oz (104.7 kg)    General: Elderly. Alert and in no acute distress.  His weight is down 9 pounds over the past 2 months. He is in a scooter today.  Cardiac: Fairly regular - paced.  His rate is ok.  No significant edema.  Respiratory:  Lungs are clear to auscultation bilaterally with normal work of breathing.  GI: Soft and nontender.  MS: No deformity or atrophy. Gait and ROM intact.  Skin: Warm and dry. Color is normal.  Neuro:  Strength and sensation are intact and no gross focal deficits noted.  Psych: Alert, appropriate and with  normal affect.   LABORATORY DATA:  EKG:  EKG is not ordered today.   Lab Results  Component Value Date   WBC 8.1 09/16/2019   HGB 11.0 (L) 09/16/2019   HCT 31.7 (L) 09/16/2019   PLT 144.0 (L) 09/16/2019   GLUCOSE 165 (H) 08/18/2019   ALT 26 06/19/2019   AST 25 06/19/2019   NA 132 (L) 08/18/2019   K 4.1 08/18/2019   CL 90 (L) 08/18/2019   CREATININE 1.86 (H) 08/18/2019   BUN 38 (H) 08/18/2019   CO2 28 08/18/2019   TSH 4.750 (H) 09/11/2018   INR 1.87 10/06/2016   HGBA1C 6.6 (H) 11/07/2012     BNP (last 3 results) No results for input(s): BNP in the last 8760 hours.  ProBNP (last 3 results) No results for input(s): PROBNP in the last 8760 hours.   Other Studies Reviewed Today:        Assessment/Plan:  1. Chronic diastolic HF - on bi-weekly Zaroxolyn which has helped significantly. Symptoms are stable.   2. Chest pain/presumed CAD - on long acting nitrate therapy. Symptoms controlled.   3. Underlying PPM  4. CKD  5. Persistent AF - still on coumadin = managed by his PCP  6. Advanced age - some memory issues - some slowing down in general but still pretty spry. Seems to have done better with more oversight with his medicines.   7. HTN - BP looks good. No changes made today.   8. DNR Current medicines are reviewed with the patient today.  The patient does not have concerns regarding medicines other than what has been noted above.  The following changes have been made:  See above.  Labs/ tests ordered today include:   No orders of the defined types were placed in this encounter.    Disposition:   FU with me in 3 months.   Patient is agreeable to this plan and will call if any problems develop in the interim.  SignedTruitt Merle, NP  10/21/2019 2:29 PM  [facility] [address] [place], [place]  [address] Phone: [phone] Fax: [phone]

## 2019-10-17 ENCOUNTER — Telehealth: Payer: Self-pay | Admitting: Pulmonary Disease

## 2019-10-17 NOTE — Telephone Encounter (Signed)
Called and spoke with [name], Patient's caregiver.  [name] stated Patient is wanting to use Flonase more then once a day. [name] stated patient is using saline spray with Flonase.  Patient is wanting to use Afrin, but [name] removed it from Patient's bedroom, so he couldn't use it with Flonase.  Patient is complaining of feeling like he is not able to breathe through his nose at night, and wants instant relief.  [name] stated he is a mouth breather, and complains with dry mouth. [name] stated they just received 3 bottles of Flonase from pharmacy, and would like to know if there is something else they can try with Flonase, or use flonase more often.  Message routed to Dr Vaughan Browner, to advise

## 2019-10-21 ENCOUNTER — Ambulatory Visit: Payer: Medicare Other | Admitting: Nurse Practitioner

## 2019-10-21 ENCOUNTER — Encounter: Payer: Self-pay | Admitting: Nurse Practitioner

## 2019-10-21 ENCOUNTER — Other Ambulatory Visit: Payer: Self-pay

## 2019-10-21 VITALS — BP 130/60 | HR 59 | Ht 69.5 in | Wt 221.2 lb

## 2019-10-21 DIAGNOSIS — Z79899 Other long term (current) drug therapy: Secondary | ICD-10-CM

## 2019-10-21 DIAGNOSIS — Z7901 Long term (current) use of anticoagulants: Secondary | ICD-10-CM

## 2019-10-21 DIAGNOSIS — I4819 Other persistent atrial fibrillation: Secondary | ICD-10-CM | POA: Diagnosis not present

## 2019-10-21 DIAGNOSIS — Z950 Presence of cardiac pacemaker: Secondary | ICD-10-CM | POA: Diagnosis not present

## 2019-10-21 DIAGNOSIS — I259 Chronic ischemic heart disease, unspecified: Secondary | ICD-10-CM | POA: Diagnosis not present

## 2019-10-21 DIAGNOSIS — I5032 Chronic diastolic (congestive) heart failure: Secondary | ICD-10-CM

## 2019-10-21 DIAGNOSIS — R748 Abnormal levels of other serum enzymes: Secondary | ICD-10-CM

## 2019-10-21 NOTE — Telephone Encounter (Signed)
Order Atrovent nasal spray 2 sprays 2 times daily If this is a persistent issue then we may need to consider referral to ENT

## 2019-10-21 NOTE — Patient Instructions (Addendum)
After Visit Summary:  We will be checking the following labs today - NONE   Medication Instructions:    Continue with your current medicines.    If you need a refill on your cardiac medications before your next appointment, please call your pharmacy.     Testing/Procedures To Be Arranged:  N/A  Follow-Up:   See me in 3 months    At [facility], you and your health needs are our priority.  As part of our continuing mission to provide you with exceptional heart care, we have created designated Provider Care Teams.  These Care Teams include your primary Cardiologist (physician) and Advanced Practice Providers (APPs -  Physician Assistants and Nurse Practitioners) who all work together to provide you with the care you need, when you need it.  Special Instructions:  . Stay safe, wash your hands for at least 20 seconds and wear a mask when needed.  . It was good to talk with you today.    Call the [place] office at [phone] if you have any questions, problems or concerns.

## 2019-10-22 MED ORDER — IPRATROPIUM BROMIDE 0.03 % NA SOLN: 2.0000 | Freq: Two times a day (BID) | NASAL | 12 refills | Status: DC

## 2019-10-22 NOTE — Telephone Encounter (Signed)
Called and spoke with [name] (pt's caregiver) letting her know that we were sending Rx for atrovent nasal spray to pharmacy for pt and stated to her if he continued to have a problem that we may consider ENT referral. [name] verbalized understanding. Verified preferred pharmacy and sent Rx to pharmacy for pt. Nothing further needed.

## 2019-10-23 ENCOUNTER — Ambulatory Visit (INDEPENDENT_AMBULATORY_CARE_PROVIDER_SITE_OTHER): Payer: Medicare Other | Admitting: Podiatry

## 2019-10-23 ENCOUNTER — Other Ambulatory Visit: Payer: Self-pay

## 2019-10-23 DIAGNOSIS — E1142 Type 2 diabetes mellitus with diabetic polyneuropathy: Secondary | ICD-10-CM

## 2019-10-23 DIAGNOSIS — M79674 Pain in right toe(s): Secondary | ICD-10-CM

## 2019-10-23 DIAGNOSIS — M79675 Pain in left toe(s): Secondary | ICD-10-CM | POA: Diagnosis not present

## 2019-10-23 DIAGNOSIS — Z7901 Long term (current) use of anticoagulants: Secondary | ICD-10-CM

## 2019-10-23 DIAGNOSIS — B351 Tinea unguium: Secondary | ICD-10-CM

## 2019-10-24 NOTE — Progress Notes (Signed)
°  Subjective:  Patient ID: [name], male    DOB: 02-20-1923,  MRN: [id number]  Chief Complaint  Patient presents with   Diabetes    pt is here for diabetic foot care. Pt also states that he is looking to get a nail trim as well.    86 y.o. male presents with the above complaint. History confirmed with patient.  Here today with his daughter.  He complains of long painful toenails that are difficult to cut and are thickened and discolored.  Eliquis for his atrial fibrillation.  He has type 2 diabetes and reports paresthesias in both feet.  Objective:  Physical Exam: warm, good capillary refill, no trophic changes or ulcerative lesions, normal DP and PT pulses and loss of protective sensation at toe pulps and plantar foot. Left Foot: Onychomycosis x5 Right Foot: Onychomycosis x5  Assessment:   1. Onychomycosis   2. Pain due to onychomycosis of toenails of both feet   3. Long term current use of anticoagulant therapy   4. Type 2 diabetes mellitus with polyneuropathy ([place])      Plan:  Patient was evaluated and treated and all questions answered.   Patient educated on diabetes. Discussed proper diabetic foot care and discussed risks and complications of disease. Educated patient in depth on reasons to return to the office immediately should he/she discover anything concerning or new on the feet. All questions answered. Discussed proper shoes as well.   Discussed the etiology and treatment options for the condition in detail with the patient. Educated patient on the topical and oral treatment options for mycotic nails. Recommended debridement of the nails today. Sharp and mechanical debridement performed of all painful and mycotic nails today. Nails debrided in length and thickness using a nail nipper and a mechanical burr to level of comfort. Discussed treatment options including appropriate shoe gear. Follow up as needed for painful nails.  He had a small amount of bleeding on  the right third toe on nail debridement.  I applied a pressure dressing and they will remove this tomorrow and apply Neosporin and a Band-Aid.  Return in about 3 months (around 01/23/2020).

## 2019-11-04 ENCOUNTER — Telehealth: Payer: Self-pay | Admitting: Nurse Practitioner

## 2019-11-04 ENCOUNTER — Other Ambulatory Visit: Payer: Self-pay

## 2019-11-04 ENCOUNTER — Ambulatory Visit (INDEPENDENT_AMBULATORY_CARE_PROVIDER_SITE_OTHER): Payer: Medicare Other

## 2019-11-04 DIAGNOSIS — R0609 Other forms of dyspnea: Secondary | ICD-10-CM

## 2019-11-04 DIAGNOSIS — R0600 Dyspnea, unspecified: Secondary | ICD-10-CM | POA: Diagnosis not present

## 2019-11-04 NOTE — Telephone Encounter (Signed)
Patient's live in care giver states the patient's chiropractor friend has suggested he get on the stationary bike to build up strength. She states the patient gets out of breath getting dressed and is concerned it may be too much for him. Please advise.

## 2019-11-04 NOTE — Telephone Encounter (Signed)
I would prefer they touch base with PCP and discuss possible PT.   I think a lot of this is age related and expected.   Cecille Rubin

## 2019-11-04 NOTE — Telephone Encounter (Signed)
[name] reports patient gets pretty out of breath getting dressed (panting) sitting in a chair in the morning.  He has to sit still 10-15 min and take a ntg tablet before he feels better.   Uses a scooter when goes out or when goes through several rooms in house.  Does walk room to room otherwise.  Uses scooter 2-3 times a day and getting on/off is his exercise.   She notes a decline in his strength over the last few months.   Dr. Jannifer Rodney, his chiropractor friend is encouraging exercise on the stationary bike and the patient would try it if he would be okay.  He asked she call us to get advice.  [name] states she would be present w him when he would use the bike.    He does take his blood thinner regularly. I asked that he not start on the stationary bike at this time.     He does not have physical therapy in the home currently but [name] will discuss w him whether or not he would be interested in this.   I told her Cecille Rubin is out of the office but that we would give them a call back next week.  Adv that perhaps this is something primary care would be able to order for him so that he could be evaluated and safely exercise in the home.

## 2019-11-05 NOTE — Telephone Encounter (Signed)
S/w [name] gave Lori's recommendations.

## 2019-11-07 ENCOUNTER — Encounter: Payer: Self-pay | Admitting: *Deleted

## 2019-11-14 ENCOUNTER — Telehealth: Payer: Self-pay | Admitting: Pulmonary Disease

## 2019-11-14 NOTE — Telephone Encounter (Signed)
Called and spoke with caregiver [name] she states that patient is not using his nebs and flutter valve like he should. Informed her that he is supposed to use nebulizer twice a day and then we suggest he use the flutter valve right after so that he can get the mucus up. She said that she would let the patient know that and they would get back into a routine. Told her to call if they had any further questions. Nothing further needed at this time.

## 2019-11-18 ENCOUNTER — Telehealth: Payer: Self-pay | Admitting: Pulmonary Disease

## 2019-11-18 ENCOUNTER — Telehealth: Payer: Self-pay | Admitting: Nurse Practitioner

## 2019-11-18 NOTE — Telephone Encounter (Signed)
[name] calling back to see if [name] would t/w grandson, [name], is not on DPR pt is to call pt to give permission.

## 2019-11-18 NOTE — Telephone Encounter (Signed)
I have called [name] - she notes that [name] is sleeping now about 80% of the day. His memory is getting worse. His personality is changing as well at times. She has noted significant decline in the past week or so - she is surprised by this.   I am not surprised by this turn of events - I think he is in a general decline. Our goals need to be focused on safety and comfort. She is in agreement. I do not think we need medicine changes at this time. Could consider Hospice going forward if needed but she says he does not wish to have those services.   Burtis Junes, RN, [facility] [address] [place], [place]  [address] [phone]

## 2019-11-18 NOTE — Telephone Encounter (Signed)
Will send to triage as pt states he has been taking several NTG a day

## 2019-11-18 NOTE — Telephone Encounter (Signed)
Called and spoke with pt's caregiver [name] letting her know the results of the cxr and she verbalized understanding. Nothing further needed.

## 2019-11-18 NOTE — Telephone Encounter (Signed)
[name], patients in home care giver called to give update on patient. She states she just wants him to feel comfortable and is not sure where to go. She states that the patient is getting a lot of indigestion whether he is eating or not. She states that when sitting in a chair, eating, playing cards, etc he tends to fall asleep. She says that he is having difficulty walking/standing. He is taking several nitroglycerin's a day, but one at a time. She says that when getting dressed one day he had to sit on the toilet and then he started dry heaving. He asked for a cold cloth because he felt like he was going to pass out but he never did. He is using his nebulizer a lot more so she feels like this is bringing up a lot of phlegm that he has. She says that his vitals are good. His glucose is ranging from 130-178. His BP is fluctuating but she has not seen it get low in a while. His O2 stats are 95%-97%. She stats that this has been going on for 4-5 days. His demeanor has changed a lot and she states that he is stressed more than a normal 86 year old man. She spoke to his pulmonologist today about his phlegm. She doesn't know if he should follow up with [place] or if his meds need to be changed. Please advise.

## 2019-11-18 NOTE — Telephone Encounter (Signed)
S/w pt gave permission for Truitt Merle, NP to s/w Katharine Look @ [phone].  Will send to Cecille Rubin to call grandson.

## 2019-11-18 NOTE — Telephone Encounter (Signed)
I have left a message on Reid's phone reiterating the conversation I have had earlier today with [name] the caregiver for Mr. Winer. Mr. Lazo did give verbal consent for me to speak with Joneen Caraway.   Burtis Junes, RN, [facility] [address] [place], [place]  [address] [phone]

## 2019-11-20 ENCOUNTER — Telehealth: Payer: Self-pay | Admitting: Pulmonary Disease

## 2019-11-20 NOTE — Telephone Encounter (Signed)
Primary Pulmonologist: Dr.Mannam Last office visit and with whom: 09/16/19 Dr.Mannam What do we see them for (pulmonary problems): DOE Last OV assessment/plan:  Assessment:  Asthma Has been maintained on Symbicort for many years.  Now with mild worsening in dyspnea, chest congestion He has received a course of steroids earlier this month with persistent symptoms Get a chest x-ray today.  Check CBC with differential, IgE Give Z-Pak and prednisone 40 mg a day for 4 days.  Advised to monitor his blood sugars while on steroids  For chest congestion we will start him on saline nebs twice a day and flutter valve  Nasal congestion Advised him to stop Afrin and start Flonase nasal spray.  Plan/Recommendations: Continue Symbicort CBC with differential, IgE, chest x-ray Z-Pak, prednisone Flonase  Marshell Garfinkel MD [facility] 09/16/2019, 12:13 PM  CC: [name], MD      Patient Instructions by Marshell Garfinkel, MD at 09/16/2019 11:45 AM Author: Marshell Garfinkel, MD Author Type: Physician Filed: 09/16/2019 12:32 PM  Note Status: Signed Cosign: Cosign Not Required Encounter Date: 09/16/2019  Editor: Marshell Garfinkel, MD (Physician)               We will get a chest x-ray today Check CBC differential, IgE Continue Symbicort  Given Z-Pak and prednisone 40 mg a day for 5 days We will give her nebulizer with saline nebs twice daily and flutter valve for clearance of secretion Flonase nasal spray for nasal congestion  Follow-up in 3 months.     Instructions  We will get a chest x-ray today Check CBC differential, IgE Continue Symbicort  Given Z-Pak and prednisone 40 mg a day for 5 days We will give her nebulizer with saline nebs twice daily and flutter valve for clearance of secretion Flonase nasal spray for nasal congestion  Follow-up in 3 months.         After Visit Summary (Printed 09/16/2019) Communications    [facility] Provider CC  Chart Rep sent to [name], MD Media From this encounter Electronic signature on 09/16/2019 11:54 AM - 1 of 3 e-signatures recorded  Communication Routing History  Recipient Method Sent by Date Sent  [name], MD Fax Marshell Garfinkel, MD 09/16/2019  Fax: [phone]  Phone: [phone]   No questionnaires available.           Orders Placed    CBC with Differential   IgE   DG Chest 2 View   Ambulatory Referral for DME Closed   Flutter valve   All Encounter Results  Medication Changes    Azithromycin 250 mg Oral As directed   Fluticasone Propionate 50 MCG/ACT 2 sprays Each Nare Daily   predniSONE 10 MG 40mg  daily for five days   Sodium Chloride 3 % Nebulization 2 times daily   Levothyroxine Sodium      (Change in therapy)   112 mcg Oral Daily before breakfast     (Change in therapy)   Medication List  Visit Diagnoses    Dyspnea on exertion   Problem List  Level of Service  Level of Service  PR OFFICE/OUTPATIENT NEW HIGH MDM 60-74 MINUTES [99205]  All Charges for This Encounter  Code Description Service Date Service Provider Modifiers Qty  [phone] PR OFFICE/OUTPATIENT NEW HIGH MDM 60-74 MINUTES 09/16/2019 Mannam, Hart Robinsons, MD  1  85025 CHG COMPLETE CBC & AUTO DIFF WBC 09/16/2019 Mannam, Praveen, MD  1   Was appointment offered to patient (explain)?     Reason for call:  Spoke with patient's live in caregiver [name]). Patient coughing up dark Brown mucus. Patient had been sleeping a lot and not appetite a few days ago.Patient was able to go out to lunch and eat a little.Patient has both COVID vaccines.[name] was wondering if patient can get antibiotics sent into his pharmacy.  Dr.Mannam can you please advise.  Thank you     Allergies  Allergen Reactions  . Hctz [Hydrochlorothiazide]     Hyponatremia     Immunization History  Administered Date(s) Administered  . Influenza Whole 11/27/2008  . Influenza,inj,Quad PF,6+ Mos 11/06/2012

## 2019-11-21 MED ORDER — AZITHROMYCIN 250 MG PO TABS
ORAL_TABLET | ORAL | 0 refills | Status: DC
Start: 2019-11-21 — End: 2019-11-30

## 2019-11-21 NOTE — Telephone Encounter (Signed)
Spoke with the pt's caregiver, [name] and notified of response per Dr Vaughan Browner  She verbalized understanding  Rx was sent to pharm

## 2019-11-21 NOTE — Telephone Encounter (Signed)
Call in z pack.   

## 2019-11-21 NOTE — Telephone Encounter (Signed)
Left a voicemail for [facility] live in nurse to contact our office back .

## 2019-11-24 ENCOUNTER — Inpatient Hospital Stay (HOSPITAL_COMMUNITY)
Admission: EM | Admit: 2019-11-24 | Discharge: 2019-11-30 | DRG: 640 | Disposition: A | Discharge status: Home / Self Care | Payer: Medicare Other | Attending: Internal Medicine | Admitting: Internal Medicine

## 2019-11-24 ENCOUNTER — Other Ambulatory Visit: Payer: Self-pay

## 2019-11-24 ENCOUNTER — Inpatient Hospital Stay (HOSPITAL_COMMUNITY): Payer: Medicare Other

## 2019-11-24 ENCOUNTER — Encounter (HOSPITAL_COMMUNITY): Payer: Self-pay

## 2019-11-24 DIAGNOSIS — E871 Hypo-osmolality and hyponatremia: Principal | ICD-10-CM | POA: Diagnosis present

## 2019-11-24 DIAGNOSIS — R0602 Shortness of breath: Secondary | ICD-10-CM

## 2019-11-24 DIAGNOSIS — Z79899 Other long term (current) drug therapy: Secondary | ICD-10-CM

## 2019-11-24 DIAGNOSIS — I442 Atrioventricular block, complete: Secondary | ICD-10-CM | POA: Diagnosis present

## 2019-11-24 DIAGNOSIS — Z888 Allergy status to other drugs, medicaments and biological substances status: Secondary | ICD-10-CM

## 2019-11-24 DIAGNOSIS — R319 Hematuria, unspecified: Secondary | ICD-10-CM | POA: Diagnosis present

## 2019-11-24 DIAGNOSIS — D649 Anemia, unspecified: Secondary | ICD-10-CM

## 2019-11-24 DIAGNOSIS — N1832 Chronic kidney disease, stage 3b: Secondary | ICD-10-CM | POA: Diagnosis not present

## 2019-11-24 DIAGNOSIS — I340 Nonrheumatic mitral (valve) insufficiency: Secondary | ICD-10-CM | POA: Diagnosis not present

## 2019-11-24 DIAGNOSIS — I4891 Unspecified atrial fibrillation: Secondary | ICD-10-CM | POA: Diagnosis present

## 2019-11-24 DIAGNOSIS — R338 Other retention of urine: Secondary | ICD-10-CM | POA: Diagnosis present

## 2019-11-24 DIAGNOSIS — D72829 Elevated white blood cell count, unspecified: Secondary | ICD-10-CM

## 2019-11-24 DIAGNOSIS — I10 Essential (primary) hypertension: Secondary | ICD-10-CM

## 2019-11-24 DIAGNOSIS — J9811 Atelectasis: Secondary | ICD-10-CM | POA: Diagnosis not present

## 2019-11-24 DIAGNOSIS — Z7901 Long term (current) use of anticoagulants: Secondary | ICD-10-CM | POA: Diagnosis not present

## 2019-11-24 DIAGNOSIS — Z79890 Hormone replacement therapy (postmenopausal): Secondary | ICD-10-CM

## 2019-11-24 DIAGNOSIS — K219 Gastro-esophageal reflux disease without esophagitis: Secondary | ICD-10-CM | POA: Diagnosis present

## 2019-11-24 DIAGNOSIS — M109 Gout, unspecified: Secondary | ICD-10-CM | POA: Diagnosis present

## 2019-11-24 DIAGNOSIS — N401 Enlarged prostate with lower urinary tract symptoms: Secondary | ICD-10-CM | POA: Diagnosis present

## 2019-11-24 DIAGNOSIS — I5042 Chronic combined systolic (congestive) and diastolic (congestive) heart failure: Secondary | ICD-10-CM | POA: Diagnosis present

## 2019-11-24 DIAGNOSIS — Z66 Do not resuscitate: Secondary | ICD-10-CM | POA: Diagnosis not present

## 2019-11-24 DIAGNOSIS — E039 Hypothyroidism, unspecified: Secondary | ICD-10-CM | POA: Diagnosis present

## 2019-11-24 DIAGNOSIS — E876 Hypokalemia: Secondary | ICD-10-CM

## 2019-11-24 DIAGNOSIS — H3530 Unspecified macular degeneration: Secondary | ICD-10-CM | POA: Diagnosis present

## 2019-11-24 DIAGNOSIS — G9341 Metabolic encephalopathy: Secondary | ICD-10-CM | POA: Diagnosis not present

## 2019-11-24 DIAGNOSIS — N184 Chronic kidney disease, stage 4 (severe): Secondary | ICD-10-CM | POA: Diagnosis present

## 2019-11-24 DIAGNOSIS — J45909 Unspecified asthma, uncomplicated: Secondary | ICD-10-CM | POA: Diagnosis present

## 2019-11-24 DIAGNOSIS — Z6830 Body mass index (BMI) 30.0-30.9, adult: Secondary | ICD-10-CM | POA: Diagnosis not present

## 2019-11-24 DIAGNOSIS — I130 Hypertensive heart and chronic kidney disease with heart failure and stage 1 through stage 4 chronic kidney disease, or unspecified chronic kidney disease: Secondary | ICD-10-CM | POA: Diagnosis present

## 2019-11-24 DIAGNOSIS — Z87891 Personal history of nicotine dependence: Secondary | ICD-10-CM

## 2019-11-24 DIAGNOSIS — E669 Obesity, unspecified: Secondary | ICD-10-CM | POA: Diagnosis present

## 2019-11-24 DIAGNOSIS — I351 Nonrheumatic aortic (valve) insufficiency: Secondary | ICD-10-CM | POA: Diagnosis not present

## 2019-11-24 DIAGNOSIS — H9190 Unspecified hearing loss, unspecified ear: Secondary | ICD-10-CM | POA: Diagnosis present

## 2019-11-24 DIAGNOSIS — Z8261 Family history of arthritis: Secondary | ICD-10-CM

## 2019-11-24 DIAGNOSIS — M519 Unspecified thoracic, thoracolumbar and lumbosacral intervertebral disc disorder: Secondary | ICD-10-CM | POA: Diagnosis present

## 2019-11-24 DIAGNOSIS — Z20822 Contact with and (suspected) exposure to covid-19: Secondary | ICD-10-CM | POA: Diagnosis present

## 2019-11-24 DIAGNOSIS — I959 Hypotension, unspecified: Secondary | ICD-10-CM | POA: Diagnosis not present

## 2019-11-24 DIAGNOSIS — I5022 Chronic systolic (congestive) heart failure: Secondary | ICD-10-CM | POA: Insufficient documentation

## 2019-11-24 DIAGNOSIS — R001 Bradycardia, unspecified: Secondary | ICD-10-CM | POA: Diagnosis present

## 2019-11-24 DIAGNOSIS — N179 Acute kidney failure, unspecified: Secondary | ICD-10-CM | POA: Diagnosis not present

## 2019-11-24 DIAGNOSIS — Z8249 Family history of ischemic heart disease and other diseases of the circulatory system: Secondary | ICD-10-CM

## 2019-11-24 DIAGNOSIS — Z950 Presence of cardiac pacemaker: Secondary | ICD-10-CM

## 2019-11-24 DIAGNOSIS — M810 Age-related osteoporosis without current pathological fracture: Secondary | ICD-10-CM | POA: Diagnosis present

## 2019-11-24 DIAGNOSIS — I509 Heart failure, unspecified: Secondary | ICD-10-CM | POA: Diagnosis not present

## 2019-11-24 DIAGNOSIS — Z7951 Long term (current) use of inhaled steroids: Secondary | ICD-10-CM

## 2019-11-24 DIAGNOSIS — E8770 Fluid overload, unspecified: Secondary | ICD-10-CM | POA: Diagnosis not present

## 2019-11-24 DIAGNOSIS — R4182 Altered mental status, unspecified: Secondary | ICD-10-CM | POA: Diagnosis present

## 2019-11-24 DIAGNOSIS — N400 Enlarged prostate without lower urinary tract symptoms: Secondary | ICD-10-CM

## 2019-11-24 LAB — URINALYSIS, ROUTINE W REFLEX MICROSCOPIC
Bacteria, UA: NONE SEEN
Bilirubin Urine: NEGATIVE
Glucose, UA: NEGATIVE mg/dL
Ketones, ur: NEGATIVE mg/dL
Leukocytes,Ua: NEGATIVE
Nitrite: NEGATIVE
Protein, ur: NEGATIVE mg/dL
Specific Gravity, Urine: 1.011 (ref 1.005–1.030)
pH: 5 (ref 5.0–8.0)

## 2019-11-24 LAB — BASIC METABOLIC PANEL
Anion gap: 10 (ref 5–15)
BUN: 75 mg/dL — ABNORMAL HIGH (ref 8–23)
CO2: 30 mmol/L (ref 22–32)
Calcium: 8.8 mg/dL — ABNORMAL LOW (ref 8.9–10.3)
Chloride: 78 mmol/L — ABNORMAL LOW (ref 98–111)
Creatinine, Ser: 2.34 mg/dL — ABNORMAL HIGH (ref 0.61–1.24)
GFR calc Af Amer: 26 mL/min — ABNORMAL LOW (ref >60)
GFR calc non Af Amer: 23 mL/min — ABNORMAL LOW (ref >60)
Glucose, Bld: 147 mg/dL — ABNORMAL HIGH (ref 70–99)
Potassium: 3.1 mmol/L — ABNORMAL LOW (ref 3.5–5.1)
Sodium: 118 mmol/L — CL (ref 135–145)

## 2019-11-24 LAB — CBC WITH DIFFERENTIAL/PLATELET
Abs Immature Granulocytes: 0.05 10*3/uL (ref 0.00–0.07)
Basophils Absolute: 0 10*3/uL (ref 0.0–0.1)
Basophils Relative: 0 %
Eosinophils Absolute: 0 10*3/uL (ref 0.0–0.5)
Eosinophils Relative: 0 %
HCT: 30.6 % — ABNORMAL LOW (ref 39.0–52.0)
Hemoglobin: 11.2 g/dL — ABNORMAL LOW (ref 13.0–17.0)
Immature Granulocytes: 0 %
Lymphocytes Relative: 12 %
Lymphs Abs: 1.5 10*3/uL (ref 0.7–4.0)
MCH: 32.3 pg (ref 26.0–34.0)
MCHC: 36.6 g/dL — ABNORMAL HIGH (ref 30.0–36.0)
MCV: 88.2 fL (ref 80.0–100.0)
Monocytes Absolute: 1 10*3/uL (ref 0.1–1.0)
Monocytes Relative: 8 %
Neutro Abs: 9.8 10*3/uL — ABNORMAL HIGH (ref 1.7–7.7)
Neutrophils Relative %: 80 %
Platelets: 191 10*3/uL (ref 150–400)
RBC: 3.47 MIL/uL — ABNORMAL LOW (ref 4.22–5.81)
RDW: 13.5 % (ref 11.5–15.5)
WBC: 12.4 10*3/uL — ABNORMAL HIGH (ref 4.0–10.5)
nRBC: 0 % (ref 0.0–0.2)

## 2019-11-24 LAB — COMPREHENSIVE METABOLIC PANEL
ALT: 26 U/L (ref 0–44)
AST: 28 U/L (ref 15–41)
Albumin: 4 g/dL (ref 3.5–5.0)
Alkaline Phosphatase: 62 U/L (ref 38–126)
Anion gap: 11 (ref 5–15)
BUN: 79 mg/dL — ABNORMAL HIGH (ref 8–23)
CO2: 29 mmol/L (ref 22–32)
Calcium: 9.2 mg/dL (ref 8.9–10.3)
Chloride: 75 mmol/L — ABNORMAL LOW (ref 98–111)
Creatinine, Ser: 2.55 mg/dL — ABNORMAL HIGH (ref 0.61–1.24)
GFR calc Af Amer: 24 mL/min — ABNORMAL LOW (ref >60)
GFR calc non Af Amer: 21 mL/min — ABNORMAL LOW (ref >60)
Glucose, Bld: 176 mg/dL — ABNORMAL HIGH (ref 70–99)
Potassium: 3.1 mmol/L — ABNORMAL LOW (ref 3.5–5.1)
Sodium: 115 mmol/L — CL (ref 135–145)
Total Bilirubin: 1.3 mg/dL — ABNORMAL HIGH (ref 0.3–1.2)
Total Protein: 6.8 g/dL (ref 6.5–8.1)

## 2019-11-24 LAB — OSMOLALITY, URINE: Osmolality, Ur: 311 mOsm/kg (ref 300–900)

## 2019-11-24 LAB — RESPIRATORY PANEL BY RT PCR (FLU A&B, COVID)
Influenza A by PCR: NEGATIVE
Influenza B by PCR: NEGATIVE
SARS Coronavirus 2 by RT PCR: NEGATIVE

## 2019-11-24 LAB — RENAL FUNCTION PANEL
Albumin: 3.7 g/dL (ref 3.5–5.0)
Anion gap: 15 (ref 5–15)
BUN: 72 mg/dL — ABNORMAL HIGH (ref 8–23)
CO2: 28 mmol/L (ref 22–32)
Calcium: 9.2 mg/dL (ref 8.9–10.3)
Chloride: 79 mmol/L — ABNORMAL LOW (ref 98–111)
Creatinine, Ser: 2.29 mg/dL — ABNORMAL HIGH (ref 0.61–1.24)
GFR calc Af Amer: 27 mL/min — ABNORMAL LOW (ref >60)
GFR calc non Af Amer: 23 mL/min — ABNORMAL LOW (ref >60)
Glucose, Bld: 169 mg/dL — ABNORMAL HIGH (ref 70–99)
Phosphorus: 2.9 mg/dL (ref 2.5–4.6)
Potassium: 3.2 mmol/L — ABNORMAL LOW (ref 3.5–5.1)
Sodium: 122 mmol/L — ABNORMAL LOW (ref 135–145)

## 2019-11-24 LAB — MAGNESIUM: Magnesium: 2.4 mg/dL (ref 1.7–2.4)

## 2019-11-24 LAB — SODIUM, URINE, RANDOM: Sodium, Ur: <10 mmol/L

## 2019-11-24 LAB — OSMOLALITY: Osmolality: 274 mOsm/kg — ABNORMAL LOW (ref 275–295)

## 2019-11-24 LAB — PROTIME-INR
INR: 1.3 — ABNORMAL HIGH (ref 0.8–1.2)
Prothrombin Time: 16 seconds — ABNORMAL HIGH (ref 11.4–15.2)

## 2019-11-24 MED ORDER — ISOSORBIDE MONONITRATE ER 60 MG PO TB24
60.0000 mg | ORAL_TABLET | Freq: Every day | ORAL | Status: DC
  Administered 2019-11-25 – 2019-11-28 (×4): 60 mg via ORAL
  Filled 2019-11-24 (×4): qty 1

## 2019-11-24 MED ORDER — APIXABAN 2.5 MG PO TABS
2.5000 mg | ORAL_TABLET | Freq: Two times a day (BID) | ORAL | Status: DC
  Administered 2019-11-25 (×2): 2.5 mg via ORAL
  Filled 2019-11-24 (×4): qty 1

## 2019-11-24 MED ORDER — POTASSIUM CHLORIDE CRYS ER 20 MEQ PO TBCR: 20.0000 meq | EXTENDED_RELEASE_TABLET | Freq: Two times a day (BID) | ORAL | Status: DC

## 2019-11-24 MED ORDER — SODIUM CHLORIDE 0.9 % IV BOLUS (SEPSIS)
1000.0000 mL | Freq: Once | INTRAVENOUS | Status: AC
  Administered 2019-11-24: 1000 mL via INTRAVENOUS

## 2019-11-24 MED ORDER — BISOPROLOL FUMARATE 5 MG PO TABS
10.0000 mg | ORAL_TABLET | Freq: Two times a day (BID) | ORAL | Status: DC
  Administered 2019-11-25 – 2019-11-28 (×8): 10 mg via ORAL
  Filled 2019-11-24 (×4): qty 2
  Filled 2019-11-24: qty 1
  Filled 2019-11-24 (×5): qty 2

## 2019-11-24 MED ORDER — ALLOPURINOL 100 MG PO TABS
100.0000 mg | ORAL_TABLET | Freq: Every day | ORAL | Status: DC
  Administered 2019-11-25 – 2019-11-30 (×6): 100 mg via ORAL
  Filled 2019-11-24 (×6): qty 1

## 2019-11-24 MED ORDER — MOMETASONE FURO-FORMOTEROL FUM 200-5 MCG/ACT IN AERO
2.0000 | INHALATION_SPRAY | Freq: Two times a day (BID) | RESPIRATORY_TRACT | Status: DC
  Administered 2019-11-24 – 2019-11-30 (×9): 2 via RESPIRATORY_TRACT
  Filled 2019-11-24 (×2): qty 8.8

## 2019-11-24 MED ORDER — ONDANSETRON HCL 4 MG PO TABS: 4.0000 mg | ORAL_TABLET | Freq: Four times a day (QID) | ORAL | Status: DC | PRN

## 2019-11-24 MED ORDER — PROCHLORPERAZINE EDISYLATE 10 MG/2ML IJ SOLN
10.0000 mg | Freq: Four times a day (QID) | INTRAMUSCULAR | Status: DC | PRN
  Administered 2019-11-25: 10 mg via INTRAVENOUS
  Filled 2019-11-24: qty 2

## 2019-11-24 MED ORDER — SODIUM CHLORIDE 0.9 % IV SOLN: INTRAVENOUS | Status: AC

## 2019-11-24 MED ORDER — IRBESARTAN 150 MG PO TABS: 150.0000 mg | ORAL_TABLET | Freq: Every day | ORAL | Status: DC

## 2019-11-24 MED ORDER — POTASSIUM CHLORIDE CRYS ER 20 MEQ PO TBCR
30.0000 meq | EXTENDED_RELEASE_TABLET | Freq: Once | ORAL | Status: AC
  Administered 2019-11-24: 30 meq via ORAL
  Filled 2019-11-24: qty 1

## 2019-11-24 MED ORDER — HYDRALAZINE HCL 50 MG PO TABS
50.0000 mg | ORAL_TABLET | Freq: Two times a day (BID) | ORAL | Status: DC
  Administered 2019-11-25 – 2019-11-28 (×8): 50 mg via ORAL
  Filled 2019-11-24 (×7): qty 1
  Filled 2019-11-24 (×2): qty 2

## 2019-11-24 MED ORDER — ACETAMINOPHEN 650 MG RE SUPP: 650.0000 mg | Freq: Four times a day (QID) | RECTAL | Status: DC | PRN

## 2019-11-24 MED ORDER — ONDANSETRON HCL 4 MG/2ML IJ SOLN
4.0000 mg | Freq: Four times a day (QID) | INTRAMUSCULAR | Status: DC | PRN
  Administered 2019-11-24: 4 mg via INTRAVENOUS
  Filled 2019-11-24: qty 2

## 2019-11-24 MED ORDER — TEMAZEPAM 15 MG PO CAPS
15.0000 mg | ORAL_CAPSULE | Freq: Every day | ORAL | Status: DC
  Administered 2019-11-25 – 2019-11-27 (×3): 15 mg via ORAL
  Filled 2019-11-24 (×4): qty 1

## 2019-11-24 MED ORDER — SODIUM CHLORIDE 0.9 % IV SOLN: 1000.0000 mL | INTRAVENOUS | Status: DC

## 2019-11-24 MED ORDER — LEVOTHYROXINE SODIUM 112 MCG PO TABS
112.0000 ug | ORAL_TABLET | Freq: Every day | ORAL | Status: DC
  Administered 2019-11-25: 112 ug via ORAL
  Filled 2019-11-24: qty 1

## 2019-11-24 MED ORDER — FINASTERIDE 5 MG PO TABS
5.0000 mg | ORAL_TABLET | Freq: Every day | ORAL | Status: DC
  Administered 2019-11-25 – 2019-11-30 (×6): 5 mg via ORAL
  Filled 2019-11-24 (×6): qty 1

## 2019-11-24 MED ORDER — SENNOSIDES-DOCUSATE SODIUM 8.6-50 MG PO TABS: 1.0000 | ORAL_TABLET | Freq: Every evening | ORAL | Status: DC | PRN

## 2019-11-24 MED ORDER — ACETAMINOPHEN 325 MG PO TABS: 650.0000 mg | ORAL_TABLET | Freq: Four times a day (QID) | ORAL | Status: DC | PRN

## 2019-11-24 NOTE — ED Notes (Signed)
Pt placed on primofit at 22mmHg.

## 2019-11-24 NOTE — ED Provider Notes (Signed)
[facility] DEPT Provider Note   CSN: [id number] Arrival date & time: 11/24/19  1137     History Chief Complaint  Patient presents with  . Abnormal Lab    [name] is a 86 y.o. male.  HPI   Patient presented to the ED for evaluation of low sodium.  Patient states he went to his primary care doctor this morning.  He has been having some generalized weakness.  While at the [place] office the patient states he had blood tests.  He was told that his sodium level was low at 112.  Patient denies any trouble with headaches.  He is not having any fevers or chills.  No vomiting or diarrhea.  No difficulty urinating.  Patient denies any seizure activity.  He states his doctor was concerned that he could have a seizure and that is why he was told to come to the ED.  Past Medical History:  Diagnosis Date  . 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 Anticoagulant long-term use   . Asthma    until age 60   . Atrial fibrillation (HCC)     on coumadin    . Atrioventricular block, complete ([place])   . BPH (benign prostatic hyperplasia)   . Chronic combined systolic and diastolic CHF (congestive heart failure) (HCC)    EF 40% by ECHO  8/14  - followed by [name]  . Diplopia 08/14/2013  . Encounter for care or replacement of suprapubic tube ([place])   . GERD (gastroesophageal reflux disease)   . Gout   . HOH (hard of hearing)    hearing aids  . Hypertension   . Hyponatremia 2014   HCTZ  . Hypothyroid   . Lumbar disc disease   . Macular degeneration   . Obesity (BMI 30-39.9)   . Orchitis of right testicle   . Osteoporosis   . Pacemaker-Medtronic 2005   generator change  . Sinusitis     Patient Active Problem List   Diagnosis Date Noted  . Borderline diabetes mellitus 11/08/2012  . Long term current use of anticoagulant therapy 11/07/2012  . Chronic diastolic heart failure ([place]) 11/07/2012  . Lumbar disc disease 11/07/2012  . Obesity (BMI  30-39.9) 11/07/2012  . Acute hyponatremia 11/04/2012  . BPH with urinary obstruction 11/04/2012  . Atrial fibrillation ([place]) 07/18/2011  . Pacemaker-Medtronic   . Intrinsic asthma, unspecified 11/29/2007  . G E R D 11/14/2007  . Hypertension     Past Surgical History:  Procedure Laterality Date  . ABLATION  1990  . CATARACT EXTRACTION Bilateral   . CYSTOSCOPY N/A 12/11/2012   Procedure: CYSTOSCOPY;  Surgeon: [name], MD;  Location: [facility] ORS;  Service: Urology;  Laterality: N/A;  . [place]  . INSERTION OF SUPRAPUBIC CATHETER N/A 12/11/2012   Procedure: INSERTION OF SUPRAPUBIC CATHETER " NEEDS PERC BALLOON LIKE FOR PERC KIDNEY";  Surgeon: [name], MD;  Location: [facility] ORS;  Service: Urology;  Laterality: N/A;  . PACEMAKER GENERATOR CHANGE N/A 07/19/2011   Procedure: PACEMAKER GENERATOR CHANGE;  Surgeon: [name], MD;  Location: [facility] CATH LAB;  Service: Cardiovascular;  Laterality: N/A;  . PACEMAKER INSERTION    . ROBOT ASSISTED LAPAROSCOPIC RADICAL PROSTATECTOMY N/A 02/10/2013   Procedure: ROBOTIC ASSISTED LAPAROSCOPIC SIMPLE PROSTATECTOMY;  Surgeon: [name], MD;  Location: [facility] ORS;  Service: Urology;  Laterality: N/A;  . SUPRPUBIC TUBE    . TRANSURETHRAL RESECTION OF PROSTATE  Family History  Problem Relation Age of Onset  . Heart disease Mother   . Rheum arthritis Mother   . Deep vein thrombosis Sister     Social History   Tobacco Use  . Smoking status: Former Smoker    Packs/day: 1.00    Years: 2.00    Pack years: 2.00    Types: Cigarettes    Quit date: 07/17/1941    Years since quitting: 78.4  . Smokeless tobacco: Never Used  Vaping Use  . Vaping Use: Never used  Substance Use Topics  . Alcohol use: No  . Drug use: No    Home Medications Prior to Admission medications   Medication Sig Start Date End Date Taking? Authorizing Provider  allopurinol (ZYLOPRIM) 100 MG tablet Take 100 mg by mouth daily. 06/03/19  Yes  [provider]  apixaban (ELIQUIS) 2.5 MG TABS tablet Take 2.5 mg by mouth 2 (two) times daily.   Yes [provider]  bisoprolol (ZEBETA) 10 MG tablet TAKE 1 TABLET BY MOUTH  TWICE DAILY Patient taking differently: Take 10 mg by mouth in the morning and at bedtime.  07/31/19  Yes Burtis Junes, NP  budesonide-formoterol (SYMBICORT) 160-4.5 MCG/ACT inhaler Inhale 2 puffs into the lungs 2 (two) times daily.   Yes [provider]  finasteride (PROSCAR) 5 MG tablet Take 5 mg by mouth daily.    Yes [provider]  furosemide (LASIX) 80 MG tablet TAKE 1 TABLET BY MOUTH  TWICE DAILY 10/10/19  Yes Burtis Junes, NP  hydrALAZINE (APRESOLINE) 50 MG tablet Take 50 mg by mouth 2 (two) times daily.   Yes [provider]  irbesartan (AVAPRO) 150 MG tablet Take 1 tablet (150 mg total) by mouth daily. 09/18/19  Yes Burtis Junes, NP  isosorbide mononitrate (IMDUR) 60 MG 24 hr tablet Take 1 tablet (60 mg total) by mouth daily. 08/18/19 11/24/19 Yes Burtis Junes, NP  levothyroxine (SYNTHROID) 112 MCG tablet Take 112 mcg by mouth daily before breakfast.   Yes [provider]  metolazone (ZAROXOLYN) 2.5 MG tablet Take 1 tablet (2.5 mg total) by mouth as directed. To take on Sundays and Wednesdays - 1/2 hour prior to the AM dose of Furosemide 07/22/19 11/24/19 Yes Gerhardt, Marlane Hatcher, NP  nitroGLYCERIN (NITROSTAT) 0.4 MG SL tablet PLACE 1 TABLET UNDER THE TONGUE EVERY 5 MINUTES AS NEEDED FOR CHEST PAIN 04/15/19  Yes Burtis Junes, NP  omeprazole (PRILOSEC) 20 MG capsule Take 20 mg by mouth at bedtime.    Yes [provider]  ondansetron (ZOFRAN) 8 MG tablet Take 8 mg by mouth every 8 (eight) hours as needed for nausea or vomiting.   Yes [provider]  polyethylene glycol (MIRALAX / GLYCOLAX) 17 g packet Take 17 g by mouth daily.   Yes [provider]  psyllium (REGULOID) 0.52 g capsule Take 0.52 g by mouth daily.   Yes [provider]  simethicone (GAS-X) 80 MG chewable tablet Chew 1 tablet (80 mg total) by mouth every 6 (six) hours as needed (bloating). 10/06/16  Yes Julianne Rice, MD  sodium chloride HYPERTONIC 3 % nebulizer solution Take by nebulization in the morning and at bedtime. 09/16/19  Yes Mannam, Praveen, MD  temazepam (RESTORIL) 15 MG capsule Take 15 mg by mouth at bedtime.  10/02/16  Yes [provider]  traMADol (ULTRAM) 50 MG tablet Take 1 tablet (50 mg total) by mouth every 6 (six) hours as needed for moderate pain.  Post-operatively. 02/14/13  Yes [name], MD  azithromycin (ZITHROMAX) 250 MG tablet Take 2 tablet's on 1st day and 1 tablet daily until finished. Patient not taking: Reported on 11/24/2019 11/21/19   Marshell Garfinkel, MD  fluticasone (FLONASE) 50 MCG/ACT nasal spray Place 2 sprays into both nostrils daily. Patient not taking: Reported on 11/24/2019 09/16/19   Marshell Garfinkel, MD  ipratropium (ATROVENT) 0.03 % nasal spray Place 2 sprays into both nostrils every 12 (twelve) hours. Patient not taking: Reported on 11/24/2019 10/22/19   Marshell Garfinkel, MD  OneTouch Delica Lancets 49F MISC USE TO SELF MONITOR BLOOD GLUCOSE TWICE DAILY 06/05/19   [provider]  ONETOUCH VERIO test strip USE TO SELF MONITOR BLOOD GLUCOSE TWICE DAILY 06/09/19   [provider]    Allergies    Hctz [hydrochlorothiazide]  Review of Systems   Review of Systems  All other systems reviewed and are negative.   Physical Exam Updated Vital Signs BP (!) 147/68   Pulse 62   Temp 98 F (36.7 C) (Oral)   Resp 14   SpO2 97%   Physical Exam Vitals and nursing note reviewed.  Constitutional:      General: He is not in acute distress.    Appearance: He is well-developed.  HENT:     Head: Normocephalic and atraumatic.     Right Ear: External ear normal.     Left Ear: External ear normal.  Eyes:     General: No scleral icterus.       Right eye: No discharge.        Left eye:  No discharge.     Conjunctiva/sclera: Conjunctivae normal.  Neck:     Trachea: No tracheal deviation.  Cardiovascular:     Rate and Rhythm: Normal rate and regular rhythm.  Pulmonary:     Effort: Pulmonary effort is normal. No respiratory distress.     Breath sounds: Normal breath sounds. No stridor. No wheezing or rales.  Abdominal:     General: Bowel sounds are normal. There is no distension.     Palpations: Abdomen is soft.     Tenderness: There is no abdominal tenderness. There is no guarding or rebound.  Musculoskeletal:        General: No tenderness.     Cervical back: Neck supple.  Skin:    General: Skin is warm and dry.     Findings: No rash.  Neurological:     Mental Status: He is alert.     Cranial Nerves: No cranial nerve deficit (no facial droop, extraocular movements intact, no slurred speech).     Sensory: No sensory deficit.     Motor: No abnormal muscle tone or seizure activity.     Coordination: Coordination normal.     Comments: Pt is alert and oriented to person place and time     ED Results / Procedures / Treatments   Labs (all labs ordered are listed, but only abnormal results are displayed) Labs Reviewed  CBC WITH DIFFERENTIAL/PLATELET - Abnormal; Notable for the following components:      Result Value   WBC 12.4 (*)    RBC 3.47 (*)    Hemoglobin 11.2 (*)    HCT 30.6 (*)    MCHC 36.6 (*)    Neutro Abs 9.8 (*)    All other components within normal limits  COMPREHENSIVE METABOLIC PANEL - Abnormal; Notable for the following components:   Sodium 115 (*)    Potassium 3.1 (*)  Chloride 75 (*)    Glucose, Bld 176 (*)    BUN 79 (*)    Creatinine, Ser 2.55 (*)    Total Bilirubin 1.3 (*)    GFR calc non Af Amer 21 (*)    GFR calc Af Amer 24 (*)    All other components within normal limits  URINALYSIS, ROUTINE W REFLEX MICROSCOPIC - Abnormal; Notable for the following components:   Hgb urine dipstick SMALL (*)    All other components within normal  limits  PROTIME-INR - Abnormal; Notable for the following components:   Prothrombin Time 16.0 (*)    INR 1.3 (*)    All other components within normal limits  RESPIRATORY PANEL BY RT PCR (FLU A&B, COVID)  SODIUM, URINE, RANDOM  OSMOLALITY, URINE  OSMOLALITY  BASIC METABOLIC PANEL    EKG None  Radiology No results found.  Procedures .Critical Care Performed by: Dorie Rank, MD Authorized by: Dorie Rank, MD   Critical care provider statement:    Critical care time (minutes):  45   Critical care was time spent personally by me on the following activities:  Discussions with consultants, evaluation of patient's response to treatment, examination of patient, ordering and performing treatments and interventions, ordering and review of laboratory studies, ordering and review of radiographic studies, pulse oximetry, re-evaluation of patient's condition, obtaining history from patient or surrogate and review of old charts   (including critical care time)  Medications Ordered in ED Medications  sodium chloride 0.9 % bolus 1,000 mL (1,000 mLs Intravenous New Bag/Given 11/24/19 1307)    ED Course  I have reviewed the triage vital signs and the nursing notes.  Pertinent labs & imaging results that were available during my care of the patient were reviewed by me and considered in my medical decision making (see chart for details).  Clinical Course as of Nov 24 1522  Mon Nov 24, 2019  1357 Sodium level decreased at 115.   [XB]  2620 Renal function worsening compared to previous   [JK]    Clinical Course User Index [JK] Dorie Rank, MD   MDM Rules/Calculators/A&P                          Pt presented with hyponatremia diagnosed as an outpatient.  Labs in the ED confirm hyponatremia.  Pt does not have any mental status changes.  No indication for 3% Na at this time.  Will admit for further workup of hyponatremia and will correct with normal saline for now.  Final Clinical  Impression(s) / ED Diagnoses Final diagnoses:  Hyponatremia    Rx / DC Orders ED Discharge Orders    None       Dorie Rank, MD 11/24/19 1525

## 2019-11-24 NOTE — ED Triage Notes (Signed)
Pt arrived via POV, sent for low sodium (112), pt family concerned pt was seizing on arrival to ED. This RN out to see pt, pt does not appear to be seizing or in any distress. Pt alert and oriented. Pt endorses some weakness, but denies any other sx.

## 2019-11-24 NOTE — Consult Note (Signed)
[facility] Renal Consultation Note  Requesting MD: Cherylann Ratel, DO Indication for Consultation: Hyponatremia  Chief complaint: Low sodium on labs at doctor's office  HPI:  [name] is a 86 y.o. male with a history of BPH, CKD, chronic combined systolic and diastolic CHF, and HTN who presented to the hospital after abnormal labs at his 27 office.  He was found to have a sodium of 112 on labs with PCP per report.  He received 1 L normal saline bolus in the ER.  Sodium went from 115 prebolus to 118 after the bolus.  He is on Lasix and metolazone as below.  Note that he has a history of prior hyponatremia per charting.  Baseline Cr near 2 recently.  He had the labs today at his PCP's office.  His daughter is at bedside and serves as Risk manager.  He has had some nausea and has gotten a PRN ordered.  His daughter states that she has noticed some confusion.  The patient states that he was taken off of his Lasix on Friday 9/24 as he was not eating well.  He has been trying to drink lots of Gatorade.  He initially tells me he has never had a suprapubic catheter and then recalls that yes at one point he did.   Creatinine, Ser  Date/Time Value Ref Range Status  11/24/2019 04:34 PM 2.34 (H) 0.61 - 1.24 mg/dL Final  11/24/2019 01:08 PM 2.55 (H) 0.61 - 1.24 mg/dL Final  08/18/2019 03:25 PM 1.86 (H) 0.76 - 1.27 mg/dL Final  06/19/2019 07:44 PM 1.84 (H) 0.61 - 1.24 mg/dL Final  06/17/2019 03:28 PM 1.96 (H) 0.76 - 1.27 mg/dL Final  06/10/2019 03:29 PM 2.17 (H) 0.76 - 1.27 mg/dL Final  06/03/2019 02:46 PM 2.23 (H) 0.76 - 1.27 mg/dL Final  03/05/2019 02:14 PM 1.96 (H) 0.76 - 1.27 mg/dL Final  01/03/2019 12:00 PM 1.89 (H) 0.76 - 1.27 mg/dL Final  10/14/2018 10:35 AM 1.83 (H) 0.76 - 1.27 mg/dL Final  09/11/2018 02:52 PM 2.39 (H) 0.76 - 1.27 mg/dL Final  12/11/2017 04:19 PM 1.95 (H) 0.76 - 1.27 mg/dL Final  10/06/2016 11:10 AM 1.44 (H) 0.61 - 1.24 mg/dL Final  04/22/2015 06:55  PM 1.29 (H) 0.61 - 1.24 mg/dL Final  02/13/2013 07:15 AM 1.20 0.50 - 1.35 mg/dL Final  02/11/2013 03:54 AM 1.52 (H) 0.50 - 1.35 mg/dL Final  02/10/2013 07:10 PM 1.45 (H) 0.50 - 1.35 mg/dL Final  02/04/2013 02:55 PM 1.22 0.50 - 1.35 mg/dL Final  12/04/2012 03:15 PM 1.22 0.50 - 1.35 mg/dL Final  11/08/2012 04:02 AM 1.20 0.50 - 1.35 mg/dL Final  11/07/2012 03:35 AM 1.16 0.50 - 1.35 mg/dL Final  11/06/2012 03:57 AM 1.37 (H) 0.50 - 1.35 mg/dL Final  11/05/2012 03:58 AM 1.45 (H) 0.50 - 1.35 mg/dL Final  11/04/2012 08:10 PM 1.52 (H) 0.50 - 1.35 mg/dL Final  10/27/2012 06:35 PM 1.32 0.50 - 1.35 mg/dL Final  07/06/2012 04:53 AM 1.23 0.50 - 1.35 mg/dL Final  07/05/2012 04:20 AM 1.20 0.50 - 1.35 mg/dL Final  07/04/2012 05:49 PM 1.14 0.50 - 1.35 mg/dL Final  07/04/2012 11:04 AM 1.11 0.50 - 1.35 mg/dL Final  07/04/2012 04:41 AM 1.16 0.50 - 1.35 mg/dL Final  07/03/2012 09:20 PM 1.13 0.50 - 1.35 mg/dL Final  07/18/2011 01:33 PM 1.3 0.40 - 1.50 mg/dL Final  01/17/2011 09:34 PM 1.12 0.50 - 1.35 mg/dL Final  06/17/2010 04:32 PM 1.51 (H) 0.40 - 1.50 mg/dL Final  06/15/2010 03:08 PM 1.68 (H)  0.40 - 1.50 mg/dL Final  08/05/2008 10:47 AM 1.3 0.40 - 1.50 mg/dL Final  07/06/2008 08:58 PM 1.6 (H) 0.40 - 1.50 mg/dL Final  11/29/2007 03:28 PM 1.20  Final  11/29/2007 02:20 PM 1.27  Final  11/29/2007 12:48 PM 1.5  Final  07/22/2007 01:47 AM 1.7 (H)  Final     PMHx:   Past Medical History:  Diagnosis Date  . 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 Anticoagulant long-term use   . Asthma    until age 58   . Atrial fibrillation (HCC)     on coumadin    . Atrioventricular block, complete ([place])   . BPH (benign prostatic hyperplasia)   . Chronic combined systolic and diastolic CHF (congestive heart failure) (HCC)    EF 40% by ECHO  8/14  - followed by [name]  . Diplopia 08/14/2013  . Encounter for care or replacement of suprapubic tube ([place])   . GERD (gastroesophageal reflux disease)   . Gout   .  HOH (hard of hearing)    hearing aids  . Hypertension   . Hyponatremia 2014   HCTZ  . Hypothyroid   . Lumbar disc disease   . Macular degeneration   . Obesity (BMI 30-39.9)   . Orchitis of right testicle   . Osteoporosis   . Pacemaker-Medtronic 2005   generator change  . Sinusitis     Past Surgical History:  Procedure Laterality Date  . ABLATION  1990  . CATARACT EXTRACTION Bilateral   . CYSTOSCOPY N/A 12/11/2012   Procedure: CYSTOSCOPY;  Surgeon: [name], MD;  Location: [facility];  Service: Urology;  Laterality: N/A;  . [place]  . INSERTION OF SUPRAPUBIC CATHETER N/A 12/11/2012   Procedure: INSERTION OF SUPRAPUBIC CATHETER " NEEDS PERC BALLOON LIKE FOR PERC KIDNEY";  Surgeon: [name], MD;  Location: [facility];  Service: Urology;  Laterality: N/A;  . PACEMAKER GENERATOR CHANGE N/A 07/19/2011   Procedure: PACEMAKER GENERATOR CHANGE;  Surgeon: [name], MD;  Location: [facility] CATH LAB;  Service: Cardiovascular;  Laterality: N/A;  . PACEMAKER INSERTION    . ROBOT ASSISTED LAPAROSCOPIC RADICAL PROSTATECTOMY N/A 02/10/2013   Procedure: ROBOTIC ASSISTED LAPAROSCOPIC SIMPLE PROSTATECTOMY;  Surgeon: [name], MD;  Location: [facility];  Service: Urology;  Laterality: N/A;  . SUPRPUBIC TUBE    . TRANSURETHRAL RESECTION OF PROSTATE      Family Hx:  Family History  Problem Relation Age of Onset  . Heart disease Mother   . Rheum arthritis Mother   . Deep vein thrombosis Sister     Social History:  reports that he quit smoking about 78 years ago. His smoking use included cigarettes. He has a 2.00 pack-year smoking history. He has never used smokeless tobacco. He reports that he does not drink alcohol and does not use drugs.  Allergies:  Allergies  Allergen Reactions  . Hctz [Hydrochlorothiazide]     Hyponatremia     Medications: Prior to Admission medications   Medication Sig Start Date End Date Taking? Authorizing Provider  allopurinol  (ZYLOPRIM) 100 MG tablet Take 100 mg by mouth daily. 06/03/19  Yes [provider]  apixaban (ELIQUIS) 2.5 MG TABS tablet Take 2.5 mg by mouth 2 (two) times daily.   Yes [provider]  bisoprolol (ZEBETA) 10 MG tablet TAKE 1 TABLET BY MOUTH  TWICE DAILY Patient taking differently: Take 10 mg by mouth in the morning and at bedtime.  07/31/19  Yes Burtis Junes, NP  budesonide-formoterol (SYMBICORT) 160-4.5 MCG/ACT inhaler Inhale 2 puffs into the lungs 2 (two) times daily.   Yes [provider]  finasteride (PROSCAR) 5 MG tablet Take 5 mg by mouth daily.    Yes [provider]  furosemide (LASIX) 80 MG tablet TAKE 1 TABLET BY MOUTH  TWICE DAILY 10/10/19  Yes Burtis Junes, NP  hydrALAZINE (APRESOLINE) 50 MG tablet Take 50 mg by mouth 2 (two) times daily.   Yes [provider]  irbesartan (AVAPRO) 150 MG tablet Take 1 tablet (150 mg total) by mouth daily. 09/18/19  Yes Burtis Junes, NP  isosorbide mononitrate (IMDUR) 60 MG 24 hr tablet Take 1 tablet (60 mg total) by mouth daily. 08/18/19 11/24/19 Yes Burtis Junes, NP  levothyroxine (SYNTHROID) 112 MCG tablet Take 112 mcg by mouth daily before breakfast.   Yes [provider]  metolazone (ZAROXOLYN) 2.5 MG tablet Take 1 tablet (2.5 mg total) by mouth as directed. To take on Sundays and Wednesdays - 1/2 hour prior to the AM dose of Furosemide 07/22/19 11/24/19 Yes Gerhardt, Marlane Hatcher, NP  nitroGLYCERIN (NITROSTAT) 0.4 MG SL tablet PLACE 1 TABLET UNDER THE TONGUE EVERY 5 MINUTES AS NEEDED FOR CHEST PAIN 04/15/19  Yes Burtis Junes, NP  omeprazole (PRILOSEC) 20 MG capsule Take 20 mg by mouth at bedtime.    Yes [provider]  ondansetron (ZOFRAN) 8 MG tablet Take 8 mg by mouth every 8 (eight) hours as needed for nausea or vomiting.   Yes [provider]  polyethylene glycol (MIRALAX / GLYCOLAX) 17 g packet Take 17 g by mouth daily.   Yes [provider]  psyllium  (REGULOID) 0.52 g capsule Take 0.52 g by mouth daily.   Yes [provider]  simethicone (GAS-X) 80 MG chewable tablet Chew 1 tablet (80 mg total) by mouth every 6 (six) hours as needed (bloating). 10/06/16  Yes Julianne Rice, MD  sodium chloride HYPERTONIC 3 % nebulizer solution Take by nebulization in the morning and at bedtime. 09/16/19  Yes Mannam, Praveen, MD  temazepam (RESTORIL) 15 MG capsule Take 15 mg by mouth at bedtime.  10/02/16  Yes [provider]  traMADol (ULTRAM) 50 MG tablet Take 1 tablet (50 mg total) by mouth every 6 (six) hours as needed for moderate pain. Post-operatively. 02/14/13  Yes [name], MD  azithromycin (ZITHROMAX) 250 MG tablet Take 2 tablet's on 1st day and 1 tablet daily until finished. Patient not taking: Reported on 11/24/2019 11/21/19   Marshell Garfinkel, MD  fluticasone (FLONASE) 50 MCG/ACT nasal spray Place 2 sprays into both nostrils daily. Patient not taking: Reported on 11/24/2019 09/16/19   Marshell Garfinkel, MD  ipratropium (ATROVENT) 0.03 % nasal spray Place 2 sprays into both nostrils every 12 (twelve) hours. Patient not taking: Reported on 11/24/2019 10/22/19   Marshell Garfinkel, MD  OneTouch Delica Lancets 29F MISC USE TO SELF MONITOR BLOOD GLUCOSE TWICE DAILY 06/05/19   [provider]  ONETOUCH VERIO test strip USE TO SELF MONITOR BLOOD GLUCOSE TWICE DAILY 06/09/19   [provider]    I have reviewed the patient's current medications.  Labs:  BMP Latest Ref Rng & Units 11/24/2019 11/24/2019 08/18/2019  Glucose 70 - 99 mg/dL 147(H) 176(H) 165(H)  BUN 8 - 23 mg/dL 75(H) 79(H) 38(H)  Creatinine 0.61 - 1.24 mg/dL 2.34(H) 2.55(H) 1.86(H)  BUN/Creat Ratio 10 - 24 - - 20  Sodium 135 - 145 mmol/L 118(LL) 115(LL)  132(L)  Potassium 3.5 - 5.1 mmol/L 3.1(L) 3.1(L) 4.1  Chloride 98 - 111 mmol/L 78(L) 75(L) 90(L)  CO2 22 - 32 mmol/L 30 29 28   Calcium 8.9 - 10.3 mg/dL 8.8(L) 9.2 9.7    Urinalysis    Component Value Date/Time    COLORURINE YELLOW 11/24/2019 1336   APPEARANCEUR CLEAR 11/24/2019 1336   LABSPEC 1.011 11/24/2019 1336   PHURINE 5.0 11/24/2019 1336   GLUCOSEU NEGATIVE 11/24/2019 1336   HGBUR SMALL (A) 11/24/2019 1336   BILIRUBINUR NEGATIVE 11/24/2019 1336   [place] 11/24/2019 1336   PROTEINUR NEGATIVE 11/24/2019 1336   UROBILINOGEN 0.2 11/04/2012 1955   NITRITE NEGATIVE 11/24/2019 1336   LEUKOCYTESUR NEGATIVE 11/24/2019 1336     ROS:  Pertinent items noted in HPI and remainder of comprehensive ROS otherwise negative.   Physical Exam: Vitals:   11/24/19 1630 11/24/19 1700  BP: (!) 150/80 (!) 152/67  Pulse: 61 63  Resp: 18 14  Temp:    SpO2: 98% 97%     General: elderly male in bed in NAD HEENT: NCAT Eyes: left eyelid a little swollen; chronic per pt/daughter Neck: no JVD supple trachea midline Heart: S1S2 no rub Lungs: clear to auscultation and unlabored on room air Abdomen: soft nontender obese habitus no suprapubic Extremities: no pitting edema appreciated Skin: no rash on extremities exposed Neuro:awake and conversant; hard of hearing provides hx  Psych normal mood and affect GU no suprapubic external collection bag but no foley  Assessment/Plan:  Hyponatremia Improving on presentation to ER and now status post 1 L bolus normal saline Urine and serum osm and urine sodium ordered per primary   NS at 50 /hr x 12 hours and reassess  - noted team has labs ordered every 6 hours - stop normal saline if Na 122 or higher - order placed  Urinary retention Would place Foley catheter  CKD stage 4 - baseline Cr is 2     HTN  - acceptable control on current regimen  Chronic combined systolic and diastolic CHF  - noted on outpatient diuretics  - caution with hydration    Hypokalemia  - replete gently   Claudia Desanctis 11/24/2019, 6:09 PM

## 2019-11-24 NOTE — ED Notes (Signed)
Bladder scan complete. Pt appeared to have a large amount of urine in bladder. However, bladder scanner did not deliver a specific number due to machine not working properly.

## 2019-11-24 NOTE — H&P (Signed)
History and Physical    PAULO KEIMIG JAS:[id number] DOB: 05/06/1922 DOA: 11/24/2019  PCP: [name], MD  Patient coming from: Home  Chief Complaint: Fatigue  HPI: [name] is a 86 y.o. male with medical history significant of a fib, HFpEF. History from dtr at bedside. She reports that the patient has been fatigued for over a week. She states his home nurse has notice he has been more sluggish. No obvious fevers, respiratory symptoms, urinary symptoms, or GI symptoms. Just a general fatigue and not being himself. They were concerned so he visited his PCP 3 days ago. He was found to be hyponatremic at the time. Per dtr's report, she was told to have him drink lots of gatorade over the weekend d/t overflowing hospitals. They did this and went back to the PCP today. His sodium was noted to be lower. They were directed to come to the ED for evaluation. He denies seizure-like episodes, HA, N/V.   ED Course: Found to be hyponatremic (Na+ 115). Given NS bolus of 1L and started on NS 125cc/hr. TRH called for admission.   Review of Systems:  Denies CP, dyspnea, cough, syncopal episodes. Review of systems is otherwise negative for all not mentioned in HPI.   Past Medical History:  Diagnosis Date  . 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 Anticoagulant long-term use   . Asthma    until age 35   . Atrial fibrillation (HCC)     on coumadin    . Atrioventricular block, complete ([place])   . BPH (benign prostatic hyperplasia)   . Chronic combined systolic and diastolic CHF (congestive heart failure) (HCC)    EF 40% by ECHO  8/14  - followed by [name]  . Diplopia 08/14/2013  . Encounter for care or replacement of suprapubic tube ([place])   . GERD (gastroesophageal reflux disease)   . Gout   . HOH (hard of hearing)    hearing aids  . Hypertension   . Hyponatremia 2014   HCTZ  . Hypothyroid   . Lumbar disc disease   . Macular degeneration   . Obesity (BMI 30-39.9)   . Orchitis  of right testicle   . Osteoporosis   . Pacemaker-Medtronic 2005   generator change  . Sinusitis     Past Surgical History:  Procedure Laterality Date  . ABLATION  1990  . CATARACT EXTRACTION Bilateral   . CYSTOSCOPY N/A 12/11/2012   Procedure: CYSTOSCOPY;  Surgeon: [name], MD;  Location: [facility];  Service: Urology;  Laterality: N/A;  . [place]  . INSERTION OF SUPRAPUBIC CATHETER N/A 12/11/2012   Procedure: INSERTION OF SUPRAPUBIC CATHETER " NEEDS PERC BALLOON LIKE FOR PERC KIDNEY";  Surgeon: [name], MD;  Location: [facility];  Service: Urology;  Laterality: N/A;  . PACEMAKER GENERATOR CHANGE N/A 07/19/2011   Procedure: PACEMAKER GENERATOR CHANGE;  Surgeon: [name], MD;  Location: [facility] CATH LAB;  Service: Cardiovascular;  Laterality: N/A;  . PACEMAKER INSERTION    . ROBOT ASSISTED LAPAROSCOPIC RADICAL PROSTATECTOMY N/A 02/10/2013   Procedure: ROBOTIC ASSISTED LAPAROSCOPIC SIMPLE PROSTATECTOMY;  Surgeon: [name], MD;  Location: [facility];  Service: Urology;  Laterality: N/A;  . SUPRPUBIC TUBE    . TRANSURETHRAL RESECTION OF PROSTATE       reports that he quit smoking about 78 years ago. His smoking use included cigarettes. He has a 2.00 pack-year smoking history. He has never used smokeless tobacco. He reports that  he does not drink alcohol and does not use drugs.  Allergies  Allergen Reactions  . Hctz [Hydrochlorothiazide]     Hyponatremia     Family History  Problem Relation Age of Onset  . Heart disease Mother   . Rheum arthritis Mother   . Deep vein thrombosis Sister     Prior to Admission medications   Medication Sig Start Date End Date Taking? Authorizing Provider  allopurinol (ZYLOPRIM) 100 MG tablet Take 100 mg by mouth daily. 06/03/19  Yes [provider]  apixaban (ELIQUIS) 2.5 MG TABS tablet Take 2.5 mg by mouth 2 (two) times daily.   Yes [provider]  bisoprolol (ZEBETA) 10 MG tablet TAKE 1 TABLET BY  MOUTH  TWICE DAILY Patient taking differently: Take 10 mg by mouth in the morning and at bedtime.  07/31/19  Yes Burtis Junes, NP  budesonide-formoterol (SYMBICORT) 160-4.5 MCG/ACT inhaler Inhale 2 puffs into the lungs 2 (two) times daily.   Yes [provider]  finasteride (PROSCAR) 5 MG tablet Take 5 mg by mouth daily.    Yes [provider]  furosemide (LASIX) 80 MG tablet TAKE 1 TABLET BY MOUTH  TWICE DAILY 10/10/19  Yes Burtis Junes, NP  hydrALAZINE (APRESOLINE) 50 MG tablet Take 50 mg by mouth 2 (two) times daily.   Yes [provider]  irbesartan (AVAPRO) 150 MG tablet Take 1 tablet (150 mg total) by mouth daily. 09/18/19  Yes Burtis Junes, NP  isosorbide mononitrate (IMDUR) 60 MG 24 hr tablet Take 1 tablet (60 mg total) by mouth daily. 08/18/19 11/24/19 Yes Burtis Junes, NP  levothyroxine (SYNTHROID) 112 MCG tablet Take 112 mcg by mouth daily before breakfast.   Yes [provider]  metolazone (ZAROXOLYN) 2.5 MG tablet Take 1 tablet (2.5 mg total) by mouth as directed. To take on Sundays and Wednesdays - 1/2 hour prior to the AM dose of Furosemide 07/22/19 11/24/19 Yes Gerhardt, Marlane Hatcher, NP  nitroGLYCERIN (NITROSTAT) 0.4 MG SL tablet PLACE 1 TABLET UNDER THE TONGUE EVERY 5 MINUTES AS NEEDED FOR CHEST PAIN 04/15/19  Yes Burtis Junes, NP  omeprazole (PRILOSEC) 20 MG capsule Take 20 mg by mouth at bedtime.    Yes [provider]  ondansetron (ZOFRAN) 8 MG tablet Take 8 mg by mouth every 8 (eight) hours as needed for nausea or vomiting.   Yes [provider]  polyethylene glycol (MIRALAX / GLYCOLAX) 17 g packet Take 17 g by mouth daily.   Yes [provider]  psyllium (REGULOID) 0.52 g capsule Take 0.52 g by mouth daily.   Yes [provider]  simethicone (GAS-X) 80 MG chewable tablet Chew 1 tablet (80 mg total) by mouth every 6 (six) hours as needed (bloating). 10/06/16  Yes Julianne Rice, MD  sodium chloride  HYPERTONIC 3 % nebulizer solution Take by nebulization in the morning and at bedtime. 09/16/19  Yes Mannam, Praveen, MD  temazepam (RESTORIL) 15 MG capsule Take 15 mg by mouth at bedtime.  10/02/16  Yes [provider]  traMADol (ULTRAM) 50 MG tablet Take 1 tablet (50 mg total) by mouth every 6 (six) hours as needed for moderate pain. Post-operatively. 02/14/13  Yes [name], MD  azithromycin (ZITHROMAX) 250 MG tablet Take 2 tablet's on 1st day and 1 tablet daily until finished. Patient not taking: Reported on 11/24/2019 11/21/19   Marshell Garfinkel, MD  fluticasone (FLONASE) 50 MCG/ACT nasal spray Place 2 sprays into both nostrils daily. Patient  not taking: Reported on 11/24/2019 09/16/19   Marshell Garfinkel, MD  ipratropium (ATROVENT) 0.03 % nasal spray Place 2 sprays into both nostrils every 12 (twelve) hours. Patient not taking: Reported on 11/24/2019 10/22/19   Marshell Garfinkel, MD  OneTouch Delica Lancets 83M MISC USE TO SELF MONITOR BLOOD GLUCOSE TWICE DAILY 06/05/19   [provider]  ONETOUCH VERIO test strip USE TO SELF MONITOR BLOOD GLUCOSE TWICE DAILY 06/09/19   [provider]    Physical Exam: Vitals:   11/24/19 1253 11/24/19 1335 11/24/19 1430 11/24/19 1530  BP: 130/70 (!) 143/63 (!) 147/68 (!) 149/72  Pulse: 66 60 62 60  Resp: 20 17 14 14   Temp:      TempSrc:      SpO2: 98% 96% 97% 98%    General: 86 y.o. male resting in bed in NAD Eyes: right eye PERRL, normal sclera; left eye damage ENMT: Nares patent w/o discharge, orophaynx clear, dentition normal, ears w/o discharge/lesions/ulcers Neck: Supple, trachea midline Cardiovascular: RRR, +S1, S2, no g/r, 2/6 SEM, equal pulses throughout Respiratory: CTABL, no w/r/r, normal WOB GI: BS+, NDNT, no masses noted, no organomegaly noted MSK: No e/c/c Skin: No rashes, bruises, ulcerations noted; numerous skin tags on torso Neuro: A&O x 3, hard of hearing, vision only in left eye Psyc: Appropriate interaction  and affect, calm/cooperative  Labs on Admission: I have personally reviewed following labs and imaging studies  CBC: Recent Labs  Lab 11/24/19 1308  WBC 12.4*  NEUTROABS 9.8*  HGB 11.2*  HCT 30.6*  MCV 88.2  PLT 196   Basic Metabolic Panel: Recent Labs  Lab 11/24/19 1308  NA 115*  K 3.1*  CL 75*  CO2 29  GLUCOSE 176*  BUN 79*  CREATININE 2.55*  CALCIUM 9.2   GFR: CrCl cannot be calculated (Unknown ideal weight.). Liver Function Tests: Recent Labs  Lab 11/24/19 1308  AST 28  ALT 26  ALKPHOS 62  BILITOT 1.3*  PROT 6.8  ALBUMIN 4.0   No results for input(s): LIPASE, AMYLASE in the last 168 hours. No results for input(s): AMMONIA in the last 168 hours. Coagulation Profile: Recent Labs  Lab 11/24/19 1308  INR 1.3*   Cardiac Enzymes: No results for input(s): CKTOTAL, CKMB, CKMBINDEX, TROPONINI in the last 168 hours. BNP (last 3 results) No results for input(s): PROBNP in the last 8760 hours. HbA1C: No results for input(s): HGBA1C in the last 72 hours. CBG: No results for input(s): GLUCAP in the last 168 hours. Lipid Profile: No results for input(s): CHOL, HDL, LDLCALC, TRIG, CHOLHDL, LDLDIRECT in the last 72 hours. Thyroid Function Tests: No results for input(s): TSH, T4TOTAL, FREET4, T3FREE, THYROIDAB in the last 72 hours. Anemia Panel: No results for input(s): VITAMINB12, FOLATE, FERRITIN, TIBC, IRON, RETICCTPCT in the last 72 hours. Urine analysis:    Component Value Date/Time   COLORURINE YELLOW 11/24/2019 1336   APPEARANCEUR CLEAR 11/24/2019 1336   LABSPEC 1.011 11/24/2019 1336   PHURINE 5.0 11/24/2019 1336   GLUCOSEU NEGATIVE 11/24/2019 1336   HGBUR SMALL (A) 11/24/2019 1336   BILIRUBINUR NEGATIVE 11/24/2019 [place] 11/24/2019 1336   PROTEINUR NEGATIVE 11/24/2019 1336   UROBILINOGEN 0.2 11/04/2012 1955   NITRITE NEGATIVE 11/24/2019 1336   LEUKOCYTESUR NEGATIVE 11/24/2019 1336    Radiological Exams on Admission: No  results found.  Assessment/Plan Hyponatremia     - admit to inpt, progressive     - baseline Na+ is in low 130's     - consulted  nephro: hold fluids, will eval today     - q6h renal function exam     - check CXR  AKI on CKD 3b     - baseline SCr is about 1.8     - watch nephrotoxins     - hold his irbesartan     - fluids as directed by nephro  Hypokalemia     - replace, check Mg2+  Normocytic anemia     - no evidence of bleed     - check iron studies  HTN     - bisoprolol, hydralazine, imdur     - hold irbesartan  Leukocytosis     - UA ok, check CXR     - no fever  Hypothyroid     - synthroid  BPH     - proscar  Chronic diastolic HF     - bisoprolol, imdur     - holding irbesartan (AKI) and lasix/zaroxolyn (hyponatremia) for now     - follow daily wts, I&O; careful with fluids  A fib     - eliquis  DVT prophylaxis: Eliquis  Code Status: DNR  Family Communication: With dtr at bedside  Consults called: Nephrology  Admission status: Inpatient d/t severity of illness.   Status is: Inpatient  Remains inpatient appropriate because:Persistent severe electrolyte disturbances and Inpatient level of care appropriate due to severity of illness   Dispo: The patient is from: Home              Anticipated d/c is to: Home              Anticipated d/c date is: 3 days              Patient currently is not medically stable to d/c.   Jonnie Finner DO [facility]  If 7PM-7AM, please contact night-coverage [email or web address]  11/24/2019, 4:11 PM

## 2019-11-24 NOTE — ED Notes (Signed)
Pt had an episode of emesis and states he is unable keep anything down at the moment. Pt requesting something for nausea. Held pt PO meds until antiemesis meds given

## 2019-11-25 DIAGNOSIS — E876 Hypokalemia: Secondary | ICD-10-CM

## 2019-11-25 DIAGNOSIS — E871 Hypo-osmolality and hyponatremia: Principal | ICD-10-CM

## 2019-11-25 DIAGNOSIS — D649 Anemia, unspecified: Secondary | ICD-10-CM

## 2019-11-25 DIAGNOSIS — I509 Heart failure, unspecified: Secondary | ICD-10-CM

## 2019-11-25 DIAGNOSIS — N400 Enlarged prostate without lower urinary tract symptoms: Secondary | ICD-10-CM

## 2019-11-25 DIAGNOSIS — E039 Hypothyroidism, unspecified: Secondary | ICD-10-CM

## 2019-11-25 DIAGNOSIS — N1832 Chronic kidney disease, stage 3b: Secondary | ICD-10-CM

## 2019-11-25 DIAGNOSIS — I10 Essential (primary) hypertension: Secondary | ICD-10-CM

## 2019-11-25 DIAGNOSIS — I5022 Chronic systolic (congestive) heart failure: Secondary | ICD-10-CM | POA: Insufficient documentation

## 2019-11-25 LAB — RENAL FUNCTION PANEL
Albumin: 3.8 g/dL (ref 3.5–5.0)
Albumin: 3.8 g/dL (ref 3.5–5.0)
Albumin: 3.4 g/dL — ABNORMAL LOW (ref 3.5–5.0)
Anion gap: 11 (ref 5–15)
Anion gap: 12 (ref 5–15)
Anion gap: 12 (ref 5–15)
BUN: 69 mg/dL — ABNORMAL HIGH (ref 8–23)
BUN: 70 mg/dL — ABNORMAL HIGH (ref 8–23)
BUN: 72 mg/dL — ABNORMAL HIGH (ref 8–23)
CO2: 28 mmol/L (ref 22–32)
CO2: 28 mmol/L (ref 22–32)
CO2: 27 mmol/L (ref 22–32)
Calcium: 9 mg/dL (ref 8.9–10.3)
Calcium: 8.8 mg/dL — ABNORMAL LOW (ref 8.9–10.3)
Calcium: 8.7 mg/dL — ABNORMAL LOW (ref 8.9–10.3)
Chloride: 79 mmol/L — ABNORMAL LOW (ref 98–111)
Chloride: 79 mmol/L — ABNORMAL LOW (ref 98–111)
Chloride: 80 mmol/L — ABNORMAL LOW (ref 98–111)
Creatinine, Ser: 2.28 mg/dL — ABNORMAL HIGH (ref 0.61–1.24)
Creatinine, Ser: 2.33 mg/dL — ABNORMAL HIGH (ref 0.61–1.24)
Creatinine, Ser: 2.08 mg/dL — ABNORMAL HIGH (ref 0.61–1.24)
GFR calc Af Amer: 27 mL/min — ABNORMAL LOW (ref >60)
GFR calc Af Amer: 27 mL/min — ABNORMAL LOW (ref >60)
GFR calc Af Amer: 30 mL/min — ABNORMAL LOW (ref >60)
GFR calc non Af Amer: 24 mL/min — ABNORMAL LOW (ref >60)
GFR calc non Af Amer: 23 mL/min — ABNORMAL LOW (ref >60)
GFR calc non Af Amer: 26 mL/min — ABNORMAL LOW (ref >60)
Glucose, Bld: 169 mg/dL — ABNORMAL HIGH (ref 70–99)
Glucose, Bld: 191 mg/dL — ABNORMAL HIGH (ref 70–99)
Glucose, Bld: 168 mg/dL — ABNORMAL HIGH (ref 70–99)
Phosphorus: 3.2 mg/dL (ref 2.5–4.6)
Phosphorus: 3.1 mg/dL (ref 2.5–4.6)
Phosphorus: 2.8 mg/dL (ref 2.5–4.6)
Potassium: 3.6 mmol/L (ref 3.5–5.1)
Potassium: 3.4 mmol/L — ABNORMAL LOW (ref 3.5–5.1)
Potassium: 3.3 mmol/L — ABNORMAL LOW (ref 3.5–5.1)
Sodium: 118 mmol/L — CL (ref 135–145)
Sodium: 119 mmol/L — CL (ref 135–145)
Sodium: 119 mmol/L — CL (ref 135–145)

## 2019-11-25 LAB — CBC
HCT: 30.1 % — ABNORMAL LOW (ref 39.0–52.0)
Hemoglobin: 11 g/dL — ABNORMAL LOW (ref 13.0–17.0)
MCH: 32.5 pg (ref 26.0–34.0)
MCHC: 36.5 g/dL — ABNORMAL HIGH (ref 30.0–36.0)
MCV: 89.1 fL (ref 80.0–100.0)
Platelets: 187 10*3/uL (ref 150–400)
RBC: 3.38 MIL/uL — ABNORMAL LOW (ref 4.22–5.81)
RDW: 13.5 % (ref 11.5–15.5)
WBC: 13.6 10*3/uL — ABNORMAL HIGH (ref 4.0–10.5)
nRBC: 0 % (ref 0.0–0.2)

## 2019-11-25 MED ORDER — SODIUM CHLORIDE 0.9 % IV SOLN: INTRAVENOUS | Status: DC

## 2019-11-25 MED ORDER — LEVOTHYROXINE SODIUM 112 MCG PO TABS
112.0000 ug | ORAL_TABLET | Freq: Every day | ORAL | Status: DC
  Administered 2019-11-26 – 2019-11-30 (×5): 112 ug via ORAL
  Filled 2019-11-25 (×5): qty 1

## 2019-11-25 MED ORDER — CHLORHEXIDINE GLUCONATE CLOTH 2 % EX PADS
6.0000 | MEDICATED_PAD | Freq: Every day | CUTANEOUS | Status: DC
  Administered 2019-11-26 – 2019-11-30 (×4): 6 via TOPICAL

## 2019-11-25 NOTE — ED Notes (Signed)
ED TO INPATIENT HANDOFF REPORT  ED Nurse Name and Phone #: [phone]  S Name/Age/Gender [name] 86 y.o. male Room/Bed: WA08/WA08  Code Status   Code Status: DNR  Home/SNF/Other Home Patient oriented to: self and place Is this baseline? Yes   Triage Complete: Triage complete  Chief Complaint Hyponatremia [E87.1]  Triage Note Pt arrived via POV, sent for low sodium (112), pt family concerned pt was seizing on arrival to ED. This RN out to see pt, pt does not appear to be seizing or in any distress. Pt alert and oriented. Pt endorses some weakness, but denies any other sx.     Allergies Allergies  Allergen Reactions  . Hctz [Hydrochlorothiazide]     Hyponatremia     Level of Care/Admitting Diagnosis ED Disposition    ED Disposition Condition [facility] Area: [place] [[id number]]  Level of Care: Progressive [102]  Admit to Progressive based on following criteria: MULTISYSTEM THREATS such as stable sepsis, metabolic/electrolyte imbalance with or without encephalopathy that is responding to early treatment.  Admit to Progressive based on following criteria: Other see comments  Comments: severe hyponatremia  May admit patient to [facility] or [facility] if equivalent level of care is available:: No  Covid Evaluation: Confirmed COVID Negative  Diagnosis: Hyponatremia [637858]  Admitting Physician: Jonnie Finner [[id number]]  Attending Physician: Jonnie Finner [[id number]]  Estimated length of stay: past midnight tomorrow  Certification:: I certify this patient will need inpatient services for at least 2 midnights       B Medical/Surgery History Past Medical History:  Diagnosis Date  . 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 Anticoagulant long-term use   . Asthma    until age 56   . Atrial fibrillation (HCC)     on coumadin    . Atrioventricular block, complete ([place])   . BPH (benign prostatic hyperplasia)   . Chronic  combined systolic and diastolic CHF (congestive heart failure) (HCC)    EF 40% by ECHO  8/14  - followed by [name]  . Diplopia 08/14/2013  . Encounter for care or replacement of suprapubic tube ([place])   . GERD (gastroesophageal reflux disease)   . Gout   . HOH (hard of hearing)    hearing aids  . Hypertension   . Hyponatremia 2014   HCTZ  . Hypothyroid   . Lumbar disc disease   . Macular degeneration   . Obesity (BMI 30-39.9)   . Orchitis of right testicle   . Osteoporosis   . Pacemaker-Medtronic 2005   generator change  . Sinusitis    Past Surgical History:  Procedure Laterality Date  . ABLATION  1990  . CATARACT EXTRACTION Bilateral   . CYSTOSCOPY N/A 12/11/2012   Procedure: CYSTOSCOPY;  Surgeon: [name], MD;  Location: [facility];  Service: Urology;  Laterality: N/A;  . [place]  . INSERTION OF SUPRAPUBIC CATHETER N/A 12/11/2012   Procedure: INSERTION OF SUPRAPUBIC CATHETER " NEEDS PERC BALLOON LIKE FOR PERC KIDNEY";  Surgeon: [name], MD;  Location: [facility];  Service: Urology;  Laterality: N/A;  . PACEMAKER GENERATOR CHANGE N/A 07/19/2011   Procedure: PACEMAKER GENERATOR CHANGE;  Surgeon: [name], MD;  Location: [facility] CATH LAB;  Service: Cardiovascular;  Laterality: N/A;  . PACEMAKER INSERTION    . ROBOT ASSISTED LAPAROSCOPIC RADICAL PROSTATECTOMY N/A 02/10/2013   Procedure: ROBOTIC ASSISTED LAPAROSCOPIC SIMPLE PROSTATECTOMY;  Surgeon: [name],  MD;  Location: [facility];  Service: Urology;  Laterality: N/A;  . SUPRPUBIC TUBE    . TRANSURETHRAL RESECTION OF PROSTATE       A IV Location/Drains/Wounds Patient Lines/Drains/Airways Status    Active Line/Drains/Airways    Name Placement date Placement time Site Days   Peripheral IV 11/24/19 Left Forearm 11/24/19  1304  Forearm  1   Incision - 6 Ports Abdomen 1: Right;Lateral;Lower 2: Right;Mid;Lateral 3: Right;Upper;Lateral 4: Umbilicus;Upper 5: Left;Medial;Lateral 6: Left;Lateral;Lower  02/10/13  --   2479          Intake/Output Last 24 hours  Intake/Output Summary (Last 24 hours) at 11/25/2019 1408 Last data filed at 11/24/2019 1658 Gross per 24 hour  Intake 1000 ml  Output --  Net 1000 ml    Labs/Imaging Results for orders placed or performed during the hospital encounter of 11/24/19 (from the past 48 hour(s))  Respiratory Panel by RT PCR (Flu A&B, Covid) - Nasopharyngeal Swab     Status: None   Collection Time: 11/24/19 12:16 PM   Specimen: Nasopharyngeal Swab  Result Value Ref Range   SARS Coronavirus 2 by RT PCR NEGATIVE NEGATIVE    Comment: (NOTE) SARS-CoV-2 target nucleic acids are NOT DETECTED.  The SARS-CoV-2 RNA is generally detectable in upper respiratoy specimens during the acute phase of infection. The lowest concentration of SARS-CoV-2 viral copies this assay can detect is 131 copies/mL. A negative result does not preclude SARS-Cov-2 infection and should not be used as the sole basis for treatment or other patient management decisions. A negative result may occur with  improper specimen collection/handling, submission of specimen other than nasopharyngeal swab, presence of viral mutation(s) within the areas targeted by this assay, and inadequate number of viral copies (<131 copies/mL). A negative result must be combined with clinical observations, patient history, and epidemiological information. The expected result is Negative.  Fact Sheet for Patients:  PinkCheek.be  Fact Sheet for Healthcare Providers:  GravelBags.it  This test is no t yet approved or cleared by the [place] FDA and  has been authorized for detection and/or diagnosis of SARS-CoV-2 by FDA under an Emergency Use Authorization (EUA). This EUA will remain  in effect (meaning this test can be used) for the duration of the COVID-19 declaration under Section 564(b)(1) of the Act, 21 U.S.C. section 360bbb-3(b)(1),  unless the authorization is terminated or revoked sooner.     Influenza A by PCR NEGATIVE NEGATIVE   Influenza B by PCR NEGATIVE NEGATIVE    Comment: (NOTE) The Xpert Xpress SARS-CoV-2/FLU/RSV assay is intended as an aid in  the diagnosis of influenza from Nasopharyngeal swab specimens and  should not be used as a sole basis for treatment. Nasal washings and  aspirates are unacceptable for Xpert Xpress SARS-CoV-2/FLU/RSV  testing.  Fact Sheet for Patients: PinkCheek.be  Fact Sheet for Healthcare Providers: GravelBags.it  This test is not yet approved or cleared by the [place] FDA and  has been authorized for detection and/or diagnosis of SARS-CoV-2 by  FDA under an Emergency Use Authorization (EUA). This EUA will remain  in effect (meaning this test can be used) for the duration of the  Covid-19 declaration under Section 564(b)(1) of the Act, 21  U.S.C. section 360bbb-3(b)(1), unless the authorization is  terminated or revoked. Performed at [facility], [place] [address] [place], [place] [address]   CBC with Differential/Platelet     Status: Abnormal   Collection Time: 11/24/19  1:08 PM  Result Value Ref  Range   WBC 12.4 (H) 4.0 - 10.5 K/uL   RBC 3.47 (L) 4.22 - 5.81 MIL/uL   Hemoglobin 11.2 (L) 13.0 - 17.0 g/dL   HCT 30.6 (L) 39 - 52 %   MCV 88.2 80.0 - 100.0 fL   MCH 32.3 26.0 - 34.0 pg   MCHC 36.6 (H) 30.0 - 36.0 g/dL   RDW 13.5 11.5 - 15.5 %   Platelets 191 150 - 400 K/uL   nRBC 0.0 0.0 - 0.2 %   Neutrophils Relative % 80 %   Neutro Abs 9.8 (H) 1.7 - 7.7 K/uL   Lymphocytes Relative 12 %   Lymphs Abs 1.5 0.7 - 4.0 K/uL   Monocytes Relative 8 %   Monocytes Absolute 1.0 0 - 1 K/uL   Eosinophils Relative 0 %   Eosinophils Absolute 0.0 0 - 0 K/uL   Basophils Relative 0 %   Basophils Absolute 0.0 0 - 0 K/uL   Immature Granulocytes 0 %   Abs Immature Granulocytes 0.05 0.00 - 0.07 K/uL     Comment: Performed at [facility], [place] [address], [place], [place] [address]  Comprehensive metabolic panel     Status: Abnormal   Collection Time: 11/24/19  1:08 PM  Result Value Ref Range   Sodium 115 (LL) 135 - 145 mmol/L    Comment: CRITICAL RESULT CALLED TO, READ BACK BY AND VERIFIED WITH: P.DOWD,RN [id number] @1355  BY V.WILKINS DELTA CHECK NOTED    Potassium 3.1 (L) 3.5 - 5.1 mmol/L   Chloride 75 (L) 98 - 111 mmol/L   CO2 29 22 - 32 mmol/L   Glucose, Bld 176 (H) 70 - 99 mg/dL    Comment: Glucose reference range applies only to samples taken after fasting for at least 8 hours.   BUN 79 (H) 8 - 23 mg/dL   Creatinine, Ser 2.55 (H) 0.61 - 1.24 mg/dL   Calcium 9.2 8.9 - 10.3 mg/dL   Total Protein 6.8 6.5 - 8.1 g/dL   Albumin 4.0 3.5 - 5.0 g/dL   AST 28 15 - 41 U/L   ALT 26 0 - 44 U/L   Alkaline Phosphatase 62 38 - 126 U/L   Total Bilirubin 1.3 (H) 0.3 - 1.2 mg/dL   GFR calc non Af Amer 21 (L) >60 mL/min   GFR calc Af Amer 24 (L) >60 mL/min   Anion gap 11 5 - 15    Comment: Performed at [facility], [place] [address], [place], [place] [address]  Protime-INR     Status: Abnormal   Collection Time: 11/24/19  1:08 PM  Result Value Ref Range   Prothrombin Time 16.0 (H) 11.4 - 15.2 seconds   INR 1.3 (H) 0.8 - 1.2    Comment: (NOTE) INR goal varies based on device and disease states. Performed at [facility], [place] [address], [place], [place] [address]   Osmolality     Status: Abnormal   Collection Time: 11/24/19  1:08 PM  Result Value Ref Range   Osmolality 274 (L) 275 - 295 mOsm/kg    Comment: Performed at [facility], [place] [address], [place], [place] [address]  Urinalysis, Routine w reflex microscopic Urine, Clean Catch     Status: Abnormal   Collection Time: 11/24/19  1:36 PM  Result Value Ref Range   Color, Urine YELLOW YELLOW   APPearance CLEAR CLEAR   Specific Gravity, Urine 1.011 1.005 - 1.030   pH 5.0 5.0  -  8.0   Glucose, UA NEGATIVE NEGATIVE mg/dL   Hgb urine dipstick SMALL (A) NEGATIVE   Bilirubin Urine NEGATIVE NEGATIVE   Ketones, ur NEGATIVE NEGATIVE mg/dL   Protein, ur NEGATIVE NEGATIVE mg/dL   Nitrite NEGATIVE NEGATIVE   Leukocytes,Ua NEGATIVE NEGATIVE   RBC / HPF 0-5 0 - 5 RBC/hpf   WBC, UA 0-5 0 - 5 WBC/hpf   Bacteria, UA NONE SEEN NONE SEEN    Comment: Performed at [facility], [place] [address] [place], [place] [address]  Sodium, urine, random     Status: None   Collection Time: 11/24/19  1:36 PM  Result Value Ref Range   Sodium, Ur <10 mmol/L    Comment: Performed at [place] [address], [place], [place] [address]  Osmolality, urine     Status: None   Collection Time: 11/24/19  1:36 PM  Result Value Ref Range   Osmolality, Ur 311 300 - 900 mOsm/kg    Comment: Performed at [place] [address] [place], [place] [address]  Basic metabolic panel     Status: Abnormal   Collection Time: 11/24/19  4:34 PM  Result Value Ref Range   Sodium 118 (LL) 135 - 145 mmol/L    Comment: CRITICAL RESULT CALLED TO, READ BACK BY AND VERIFIED WITH: K.SIMPSON,RN [id number] @1713  BY V.WILKINS DELTA CHECK NOTED    Potassium 3.1 (L) 3.5 - 5.1 mmol/L   Chloride 78 (L) 98 - 111 mmol/L   CO2 30 22 - 32 mmol/L   Glucose, Bld 147 (H) 70 - 99 mg/dL    Comment: Glucose reference range applies only to samples taken after fasting for at least 8 hours.   BUN 75 (H) 8 - 23 mg/dL   Creatinine, Ser 2.34 (H) 0.61 - 1.24 mg/dL   Calcium 8.8 (L) 8.9 - 10.3 mg/dL   GFR calc non Af Amer 23 (L) >60 mL/min   GFR calc Af Amer 26 (L) >60 mL/min   Anion gap 10 5 - 15    Comment: Performed at [facility], [place] [address], [place], [place] [address]  Renal function panel     Status: Abnormal   Collection Time: 11/24/19  9:30 PM  Result Value Ref Range   Sodium 122 (L) 135 - 145 mmol/L   Potassium 3.2 (L) 3.5 - 5.1 mmol/L   Chloride 79 (L) 98 - 111  mmol/L   CO2 28 22 - 32 mmol/L   Glucose, Bld 169 (H) 70 - 99 mg/dL    Comment: Glucose reference range applies only to samples taken after fasting for at least 8 hours.   BUN 72 (H) 8 - 23 mg/dL   Creatinine, Ser 2.29 (H) 0.61 - 1.24 mg/dL   Calcium 9.2 8.9 - 10.3 mg/dL   Phosphorus 2.9 2.5 - 4.6 mg/dL   Albumin 3.7 3.5 - 5.0 g/dL   GFR calc non Af Amer 23 (L) >60 mL/min   GFR calc Af Amer 27 (L) >60 mL/min   Anion gap 15 5 - 15    Comment: Performed at [facility], [place] [address], [place], [place] [address]  Magnesium     Status: None   Collection Time: 11/24/19  9:30 PM  Result Value Ref Range   Magnesium 2.4 1.7 - 2.4 mg/dL    Comment: Performed at [facility], [place] [address], [place], [place] [address]  CBC     Status: Abnormal  Collection Time: 11/25/19  3:10 AM  Result Value Ref Range   WBC 13.6 (H) 4.0 - 10.5 K/uL   RBC 3.38 (L) 4.22 - 5.81 MIL/uL   Hemoglobin 11.0 (L) 13.0 - 17.0 g/dL   HCT 30.1 (L) 39 - 52 %   MCV 89.1 80.0 - 100.0 fL   MCH 32.5 26.0 - 34.0 pg   MCHC 36.5 (H) 30.0 - 36.0 g/dL   RDW 13.5 11.5 - 15.5 %   Platelets 187 150 - 400 K/uL   nRBC 0.0 0.0 - 0.2 %    Comment: Performed at [facility], [place] [address], [place], [place] [address]  Renal function panel     Status: Abnormal   Collection Time: 11/25/19  3:10 AM  Result Value Ref Range   Sodium 118 (LL) 135 - 145 mmol/L    Comment: CRITICAL RESULT CALLED TO, READ BACK BY AND VERIFIED WITH: JEREMY @ 0346 ON 11/25/19 C VARNER    Potassium 3.6 3.5 - 5.1 mmol/L   Chloride 79 (L) 98 - 111 mmol/L   CO2 28 22 - 32 mmol/L   Glucose, Bld 169 (H) 70 - 99 mg/dL    Comment: Glucose reference range applies only to samples taken after fasting for at least 8 hours.   BUN 69 (H) 8 - 23 mg/dL   Creatinine, Ser 2.28 (H) 0.61 - 1.24 mg/dL   Calcium 9.0 8.9 - 10.3 mg/dL   Phosphorus 3.2 2.5 - 4.6 mg/dL   Albumin 3.8 3.5 - 5.0 g/dL   GFR calc non Af Amer  24 (L) >60 mL/min   GFR calc Af Amer 27 (L) >60 mL/min   Anion gap 11 5 - 15    Comment: Performed at [facility], [place] [address], [place], [place] [address]   DG CHEST PORT 1 VIEW  Result Date: 11/24/2019 CLINICAL DATA:  Seizure EXAM: PORTABLE CHEST 1 VIEW COMPARISON:  11/04/2019, 11/13/2017, 09/16/2019 FINDINGS: Left-sided pacing device as before. Enlarged cardiomediastinal silhouette with mild vascular congestion. Mild diffuse chronic interstitial opacity. No consolidation or effusion. No pneumothorax. IMPRESSION: Cardiomegaly with mild vascular congestion. No confluent airspace disease. Electronically Signed   By: Donavan Foil M.D.   On: 11/24/2019 18:40    Pending Labs Unresulted Labs (From admission, onward)          Start     Ordered   11/24/19 2100  Renal function panel  Every 6 hours,   R (with STAT occurrences)      11/24/19 1724   11/24/19 1756  Iron and TIBC  Once,   STAT        11/24/19 1755          Vitals/Pain Today's Vitals   11/25/19 0530 11/25/19 0737 11/25/19 1010 11/25/19 1014  BP: (!) 148/55  (!) 153/72   Pulse: 68  67   Resp: 17  19   Temp:      TempSrc:      SpO2: 97% 95% 93%   PainSc:    0-No pain    Isolation Precautions No active isolations  Medications Medications  allopurinol (ZYLOPRIM) tablet 100 mg (100 mg Oral Given 11/25/19 1018)  bisoprolol (ZEBETA) tablet 10 mg (10 mg Oral Given 11/25/19 1018)  hydrALAZINE (APRESOLINE) tablet 50 mg (50 mg Oral Given 11/25/19 1019)  isosorbide mononitrate (IMDUR) 24 hr tablet 60 mg (60 mg Oral Given 11/25/19 1018)  temazepam (RESTORIL) capsule 15 mg (15 mg Oral Given 11/25/19 0153)  levothyroxine (SYNTHROID)  tablet 112 mcg (112 mcg Oral Given 11/25/19 1018)  finasteride (PROSCAR) tablet 5 mg (5 mg Oral Given 11/25/19 1017)  apixaban (ELIQUIS) tablet 2.5 mg (0 mg Oral Hold 11/25/19 1109)  mometasone-formoterol (DULERA) 200-5 MCG/ACT inhaler 2 puff (2 puffs Inhalation Given 11/25/19 0737)   acetaminophen (TYLENOL) tablet 650 mg (has no administration in time range)    Or  acetaminophen (TYLENOL) suppository 650 mg (has no administration in time range)  senna-docusate (Senokot-S) tablet 1 tablet (has no administration in time range)  ondansetron (ZOFRAN) tablet 4 mg ( Oral See Alternative 11/24/19 1742)    Or  ondansetron (ZOFRAN) injection 4 mg (4 mg Intravenous Given 11/24/19 1742)  0.9 %  sodium chloride infusion (0 mLs Intravenous Stopped 11/25/19 1120)  prochlorperazine (COMPAZINE) injection 10 mg (10 mg Intravenous Given 11/25/19 0049)  0.9 %  sodium chloride infusion (has no administration in time range)  sodium chloride 0.9 % bolus 1,000 mL (0 mLs Intravenous Stopped 11/24/19 1658)  potassium chloride (KLOR-CON) CR tablet 30 mEq (30 mEq Oral Given 11/24/19 2134)    Mobility walks with device Moderate fall risk   Focused Assessments .   R Recommendations: See Admitting Provider Note  Report given to:   Additional Notes: n/a

## 2019-11-25 NOTE — Assessment & Plan Note (Signed)
-   Continue finasteride 

## 2019-11-25 NOTE — ED Notes (Signed)
Antiemetic medication given will reevaluate in 15-30 min for pt Po medications

## 2019-11-25 NOTE — Assessment & Plan Note (Addendum)
-  Continue Synthroid - TSH 8.28 (previously 4.75 a year ago); possibly elevated in acute illness - check FT4 (mildly elevated but certainly not low) and TT3 (pending at discharge) - no adjustment to Synthroid - repeat TSH in 4 weeks

## 2019-11-25 NOTE — Assessment & Plan Note (Signed)
-  Replete and recheck as needed 

## 2019-11-25 NOTE — Assessment & Plan Note (Addendum)
-  Held bisoprolol (holding for now with soft BP and borderline bradycardia) - resume eliquis; monitor hematuria and Hgb (mild decrease but also received IVF) -Bisoprolol not resumed at discharge.  Heart rate did not rebound/become tachycardic -At follow-up visit if rate not controlled, can resume at likely lower dose

## 2019-11-25 NOTE — Progress Notes (Signed)
[facility] Progress Note  Name: [name] MRN: [id number] DOB: 07-May-1922  Chief Complaint:  Low sodium at PCP   Subjective:  Normal saline stopped per order after Na 122.  Down to 118 on recheck. His daughter is at bedside .  He had a foley placed overnight after bladder scan read large amount of urine present per charting.  Had some bleeding.  Review of systems:  Denies shortness of breath or chest pain  Denies vomiting but had nausea earlier - better now   ------------ Background on consult:  [name] is a 86 y.o. male with a history of BPH, CKD, chronic combined systolic and diastolic CHF, and HTN who presented to the hospital after abnormal labs at his physician's office.  He was found to have a sodium of 112 on labs with PCP per report.  He received 1 L normal saline bolus in the ER.  Sodium went from 115 prebolus to 118 after the bolus.  He is on Lasix and metolazone as below.  Note that he has a history of prior hyponatremia per charting.  Baseline Cr near 2 recently.  He had the labs today at his PCP's office.  His daughter is at bedside and serves as Risk manager.  He has had some nausea and has gotten a PRN ordered.  His daughter states that she has noticed some confusion.  The patient states that he was taken off of his Lasix on Friday 9/24 as he was not eating well.  He has been trying to drink lots of Gatorade.  He initially tells me he has never had a suprapubic catheter and then recalls that yes at one point he did.     Intake/Output Summary (Last 24 hours) at 11/25/2019 1224 Last data filed at 11/24/2019 1658 Gross per 24 hour  Intake 1000 ml  Output --  Net 1000 ml    Vitals:  Vitals:   11/25/19 0430 11/25/19 0530 11/25/19 0737 11/25/19 1010  BP: (!) 144/72 (!) 148/55  (!) 153/72  Pulse: 66 68  67  Resp: 19 17  19   Temp:      TempSrc:      SpO2: 94% 97% 95% 93%     Physical Exam:  General: elderly male in bed in NAD HEENT:  NCAT Eyes: left eyelid a little swollen; chronic per pt/daughter Neck: no JVD supple trachea midline Heart: S1S2 no rub Lungs: clear to auscultation and unlabored on room air Abdomen: soft nontender obese habitus no suprapubic Extremities: no pitting edema appreciated Skin: no rash on extremities exposed Neuro:awake and conversant; hard of hearing provides hx  Psych normal mood and affect GU foley in place; hematuria  Medications reviewed   Labs:  BMP Latest Ref Rng & Units 11/25/2019 11/24/2019 11/24/2019  Glucose 70 - 99 mg/dL 169(H) 169(H) 147(H)  BUN 8 - 23 mg/dL 69(H) 72(H) 75(H)  Creatinine 0.61 - 1.24 mg/dL 2.28(H) 2.29(H) 2.34(H)  BUN/Creat Ratio 10 - 24 - - -  Sodium 135 - 145 mmol/L 118(LL) 122(L) 118(LL)  Potassium 3.5 - 5.1 mmol/L 3.6 3.2(L) 3.1(L)  Chloride 98 - 111 mmol/L 79(L) 79(L) 78(L)  CO2 22 - 32 mmol/L 28 28 30   Calcium 8.9 - 10.3 mg/dL 9.0 9.2 8.8(L)     Assessment/Plan:   #Hyponatremia - Improving on presentation to ER and s/p 1 L bolus normal saline and NS at 50 /hr; NS was stopped when Na reached 122 per order  - NS at 75/hr x 24  hours - ordered - Check sodium every 6 hours - note team has ordered renal panel every 6 - stop normal saline if Na over 128 before 7am tomorrow - ordered  # Urinary retention - hx BPH on finasteride - Would continue Foley catheter and flush foley (order placed for flush)  CKD stage 4 - baseline Cr is 2     HTN  - acceptable control on current regimen  Chronic combined systolic and diastolic CHF  - noted on outpatient diuretics  - caution with hydration    Hypokalemia  - repleted   Claudia Desanctis, MD 11/25/2019 12:38 PM

## 2019-11-25 NOTE — Progress Notes (Signed)
PROGRESS NOTE    KIWAN GADSDEN   EYC:[id number]  DOB: 14-Oct-1922  DOA: 11/24/2019     1  PCP: [name], MD  CC: fatigue, hyponatremia  Hospital Course: Mr. Blancett is a 86 yo CM with PMH hard of hearing, obesity, macular degeneration, hypothyroidism, hyponatremia, hypertension, gout, GERD, CHF, BPH, A. fib, heart block s/p pacer who presented to the hospital with complaints of fatigue. Symptoms had been persistent for approximately 1 week prior to hospitalization. He has a caregiver at home and also recently was evaluated by his PCP prior to admission and found to be hyponatremic. Outpatient remedies were unsuccessful and repeat sodium level had not improved and he was referred to the ER for further evaluation.  On work-up in the ER his sodium was 115. He was started on normal saline and BMPs were trended. Nephrology was also consulted while hospitalized.   Interval History:  Seen in the ER awaiting a room. Daughter present bedside. He was slightly hard of hearing but able to answer questions. He denies any shortness of breath at this time and has no tremors nor worsening fatigue. Energy appears better than on admission.  Old records reviewed in assessment of this patient  ROS: Constitutional: negative for chills and fevers, Respiratory: negative for cough, Cardiovascular: negative for chest pain and Gastrointestinal: negative for abdominal pain  Assessment & Plan: Hyponatremia - recurrent problem in past as well (historically on HCTZ per note review), now per med rec is on lasix PO 80 mg BID and metolazone 2.5 mg on Sun/Wed; possible that he is overdiuresed as noted with dilute urine in setting of diuretics - holding diuretics; continue IVF with BMP trending - nephrology also following, appreciate assistance - serum osmo 274 and urine osmo 311  Acute renal failure superimposed on stage 3b chronic kidney disease (HCC) -Baseline creatinine around 2 -Creatinine is 2.55 on  admission and improving with fluids -Continue trending BMP  Hypokalemia -Replete and recheck as needed  Normocytic anemia -No evidence of bleeding -Follow-up iron studies  Essential hypertension -Continue bisoprolol, hydralazine, Imdur -Continue holding irbesartan  Hypothyroidism -Continue Synthroid  BPH (benign prostatic hyperplasia) -Continue finasteride  Chronic CHF ([place]) -Unable to locate prior echoes for review -Given large amount of Lasix dose at home, will repeat echo now to reevaluate EF as well as diastolic function -Continue home meds, still holding irbesartan as well as diuretics -Monitor for signs of volume overload while on fluids  Atrial fibrillation (HCC) -Continue Eliquis and bisoprolol   Antimicrobials: None  DVT prophylaxis: Eliquis Code Status: DNR Family Communication: Daughter bedside Disposition Plan: Status is: Inpatient  Remains inpatient appropriate because:IV treatments appropriate due to intensity of illness or inability to take PO and Inpatient level of care appropriate due to severity of illness   Dispo: The patient is from: Home              Anticipated d/c is to: Home              Anticipated d/c date is: 2 days              Patient currently is not medically stable to d/c.       Objective: Blood pressure 135/65, pulse 71, temperature (!) 97.4 F (36.3 C), temperature source Oral, resp. rate 16, height 6' (1.829 m), weight 100.6 kg, SpO2 100 %.  Examination: General appearance: Pleasant elderly man resting in bed with daughter bedside and he is in no distress. Slightly lethargic appearing Head: Normocephalic, without  obvious abnormality, atraumatic Eyes: EOMI Lungs: Mildly coarse breath sounds anterior lung fields Heart: regular rate and rhythm and S1, S2 normal Abdomen: normal findings: bowel sounds normal and soft, non-tender Extremities: Trace lower extremity edema Skin: mobility and turgor normal Neurologic: Grossly  normal  Consultants:   Nephrology  Procedures:   None  Data Reviewed: I have personally reviewed following labs and imaging studies Results for orders placed or performed during the hospital encounter of 11/24/19 (from the past 24 hour(s))  Basic metabolic panel     Status: Abnormal   Collection Time: 11/24/19  4:34 PM  Result Value Ref Range   Sodium 118 (LL) 135 - 145 mmol/L   Potassium 3.1 (L) 3.5 - 5.1 mmol/L   Chloride 78 (L) 98 - 111 mmol/L   CO2 30 22 - 32 mmol/L   Glucose, Bld 147 (H) 70 - 99 mg/dL   BUN 75 (H) 8 - 23 mg/dL   Creatinine, Ser 2.34 (H) 0.61 - 1.24 mg/dL   Calcium 8.8 (L) 8.9 - 10.3 mg/dL   GFR calc non Af Amer 23 (L) >60 mL/min   GFR calc Af Amer 26 (L) >60 mL/min   Anion gap 10 5 - 15  Renal function panel     Status: Abnormal   Collection Time: 11/24/19  9:30 PM  Result Value Ref Range   Sodium 122 (L) 135 - 145 mmol/L   Potassium 3.2 (L) 3.5 - 5.1 mmol/L   Chloride 79 (L) 98 - 111 mmol/L   CO2 28 22 - 32 mmol/L   Glucose, Bld 169 (H) 70 - 99 mg/dL   BUN 72 (H) 8 - 23 mg/dL   Creatinine, Ser 2.29 (H) 0.61 - 1.24 mg/dL   Calcium 9.2 8.9 - 10.3 mg/dL   Phosphorus 2.9 2.5 - 4.6 mg/dL   Albumin 3.7 3.5 - 5.0 g/dL   GFR calc non Af Amer 23 (L) >60 mL/min   GFR calc Af Amer 27 (L) >60 mL/min   Anion gap 15 5 - 15  Magnesium     Status: None   Collection Time: 11/24/19  9:30 PM  Result Value Ref Range   Magnesium 2.4 1.7 - 2.4 mg/dL  CBC     Status: Abnormal   Collection Time: 11/25/19  3:10 AM  Result Value Ref Range   WBC 13.6 (H) 4.0 - 10.5 K/uL   RBC 3.38 (L) 4.22 - 5.81 MIL/uL   Hemoglobin 11.0 (L) 13.0 - 17.0 g/dL   HCT 30.1 (L) 39 - 52 %   MCV 89.1 80.0 - 100.0 fL   MCH 32.5 26.0 - 34.0 pg   MCHC 36.5 (H) 30.0 - 36.0 g/dL   RDW 13.5 11.5 - 15.5 %   Platelets 187 150 - 400 K/uL   nRBC 0.0 0.0 - 0.2 %  Renal function panel     Status: Abnormal   Collection Time: 11/25/19  3:10 AM  Result Value Ref Range   Sodium 118 (LL) 135 - 145  mmol/L   Potassium 3.6 3.5 - 5.1 mmol/L   Chloride 79 (L) 98 - 111 mmol/L   CO2 28 22 - 32 mmol/L   Glucose, Bld 169 (H) 70 - 99 mg/dL   BUN 69 (H) 8 - 23 mg/dL   Creatinine, Ser 2.28 (H) 0.61 - 1.24 mg/dL   Calcium 9.0 8.9 - 10.3 mg/dL   Phosphorus 3.2 2.5 - 4.6 mg/dL   Albumin 3.8 3.5 - 5.0 g/dL   GFR calc non  Af Amer 24 (L) >60 mL/min   GFR calc Af Amer 27 (L) >60 mL/min   Anion gap 11 5 - 15    Recent Results (from the past 240 hour(s))  Respiratory Panel by RT PCR (Flu A&B, Covid) - Nasopharyngeal Swab     Status: None   Collection Time: 11/24/19 12:16 PM   Specimen: Nasopharyngeal Swab  Result Value Ref Range Status   SARS Coronavirus 2 by RT PCR NEGATIVE NEGATIVE Final    Comment: (NOTE) SARS-CoV-2 target nucleic acids are NOT DETECTED.  The SARS-CoV-2 RNA is generally detectable in upper respiratoy specimens during the acute phase of infection. The lowest concentration of SARS-CoV-2 viral copies this assay can detect is 131 copies/mL. A negative result does not preclude SARS-Cov-2 infection and should not be used as the sole basis for treatment or other patient management decisions. A negative result may occur with  improper specimen collection/handling, submission of specimen other than nasopharyngeal swab, presence of viral mutation(s) within the areas targeted by this assay, and inadequate number of viral copies (<131 copies/mL). A negative result must be combined with clinical observations, patient history, and epidemiological information. The expected result is Negative.  Fact Sheet for Patients:  PinkCheek.be  Fact Sheet for Healthcare Providers:  GravelBags.it  This test is no t yet approved or cleared by the [place] FDA and  has been authorized for detection and/or diagnosis of SARS-CoV-2 by FDA under an Emergency Use Authorization (EUA). This EUA will remain  in effect (meaning this test can  be used) for the duration of the COVID-19 declaration under Section 564(b)(1) of the Act, 21 U.S.C. section 360bbb-3(b)(1), unless the authorization is terminated or revoked sooner.     Influenza A by PCR NEGATIVE NEGATIVE Final   Influenza B by PCR NEGATIVE NEGATIVE Final    Comment: (NOTE) The Xpert Xpress SARS-CoV-2/FLU/RSV assay is intended as an aid in  the diagnosis of influenza from Nasopharyngeal swab specimens and  should not be used as a sole basis for treatment. Nasal washings and  aspirates are unacceptable for Xpert Xpress SARS-CoV-2/FLU/RSV  testing.  Fact Sheet for Patients: PinkCheek.be  Fact Sheet for Healthcare Providers: GravelBags.it  This test is not yet approved or cleared by the [place] FDA and  has been authorized for detection and/or diagnosis of SARS-CoV-2 by  FDA under an Emergency Use Authorization (EUA). This EUA will remain  in effect (meaning this test can be used) for the duration of the  Covid-19 declaration under Section 564(b)(1) of the Act, 21  U.S.C. section 360bbb-3(b)(1), unless the authorization is  terminated or revoked. Performed at [facility], [place] [address], [place], [place] [address]      Radiology Studies: DG CHEST PORT 1 VIEW  Result Date: 11/24/2019 CLINICAL DATA:  Seizure EXAM: PORTABLE CHEST 1 VIEW COMPARISON:  11/04/2019, 11/13/2017, 09/16/2019 FINDINGS: Left-sided pacing device as before. Enlarged cardiomediastinal silhouette with mild vascular congestion. Mild diffuse chronic interstitial opacity. No consolidation or effusion. No pneumothorax. IMPRESSION: Cardiomegaly with mild vascular congestion. No confluent airspace disease. Electronically Signed   By: Donavan Foil M.D.   On: 11/24/2019 18:40   DG CHEST PORT 1 VIEW  Final Result      Scheduled Meds: . allopurinol  100 mg Oral Daily  . apixaban  2.5 mg Oral BID  . bisoprolol  10 mg  Oral BID  . finasteride  5 mg Oral Daily  . hydrALAZINE  50 mg Oral BID  . isosorbide mononitrate  60 mg Oral Daily  . [START ON 11/26/2019] levothyroxine  112 mcg Oral Q0600  . mometasone-formoterol  2 puff Inhalation BID  . temazepam  15 mg Oral QHS   PRN Meds: acetaminophen **OR** acetaminophen, ondansetron **OR** ondansetron (ZOFRAN) IV, prochlorperazine, senna-docusate Continuous Infusions: . sodium chloride        LOS: 1 day  Time spent: Greater than 50% of the 35 minute visit was spent in counseling/coordination of care for the patient as laid out in the A&P.   Dwyane Dee, MD [facility] 11/25/2019, 2:59 PM  Contact via secure chat.  To contact the attending provider between 7A-7P or the covering provider during after hours 7P-7A, please log into the web site [email or web address] and access using universal [place] password for that web site. If you do not have the password, please call the hospital operator.

## 2019-11-25 NOTE — Assessment & Plan Note (Addendum)
-  Given large amount of Lasix dose at home, will repeat echo to reevaluate EF - EF 45-50%, indeterminate diastology grade - some SOB/worsening pulm edema (and atelectasis) on 10/2 (of note, gave patient 500 cc NS bolus on 10/1 late afternoon due to hypoTN/lethargy, which improved after IVF) - has responded well to lasix 80 IV over the past 24 hrs with increased UOP; will discuss home dose of lasix with nephrology prior to d/c  -See hyponatremia for diuretic regimen

## 2019-11-25 NOTE — Hospital Course (Addendum)
Mr. Chasen is a 86 yo CM with PMH hard of hearing, obesity, macular degeneration, hypothyroidism, hyponatremia, hypertension, gout, GERD, CHF, BPH, A. fib, heart block s/p pacer who presented to the hospital with complaints of fatigue. Symptoms had been persistent for approximately 1 week prior to hospitalization. He has a caregiver at home and also recently was evaluated by his PCP prior to admission and found to be hyponatremic. Outpatient remedies were unsuccessful and repeat sodium level had not improved and he was referred to the ER for further evaluation.  On work-up in the ER his sodium was 115. He was started on normal saline and BMPs were trended. Nephrology was also consulted while hospitalized. He was initially given IVF with close monitoring and echo was repeated as well (EF 45-50%, indeterminate diastology); akinetic apex and mid inferoseptal segment is hypokinetic, otherwise remainder read as normal.  Na levels slowly improved and he was transitioned off IVF and back to lasix IV. He did have some volume overload and SOB with pulm edema/atelectasis that responded well to ongoing doses of lasix.  His Na had improved to 134 at time of discharge.   At discharge his BP regimen was also modified as he was hypotensive on his home regimen in the hospital. Discontinued bisoprolol 10 mg BID, discontinued irbesartan 150 mg daily, discontinued imdur 60 mg daily. Changed hydralazine from 50 mg BID to 25 mg TID.  He may need resumption of possibly irbesartan or imdur at followup depending on BP response.   Diuretic regimen modified: Discontinued metolazone. Continued on home regimen on lasix 80 mg BID.   TSH elevated. FT4 was essentially normal (1.19, mildly high). Tt3 pending at discharge. Likely TSH is elevated in acute illness and needs repeat TSH in about 4 weeks. No change to home dose of Synthroid 112 mcg daily.

## 2019-11-25 NOTE — Assessment & Plan Note (Addendum)
-  Baseline creatinine around 2 -Creatinine is 2.55 on admission and improved with fluids -Creatinine 1.64 at discharge, BUN 71 -Continue trending BMP -Patient plans to establish outpatient with nephrology at discharge

## 2019-11-25 NOTE — Assessment & Plan Note (Addendum)
-   intermittent hypoTN at this time;  Holding meds temp and will resume as able  -Hold bisoprolol, Imdur, irbesartan - resuming hydralazine on 10/3; assess BP response = controlled - may need adjustment further at outpt follow up visit

## 2019-11-25 NOTE — Assessment & Plan Note (Addendum)
-  No evidence of bleeding -Follow-up iron studies: normal

## 2019-11-25 NOTE — ED Notes (Signed)
Date and time results received: 11/25/19 0346 (use smartphrase ".now" to insert current time)  Test: Sodium  Critical Value: 118  Name of Provider Notified: Sharlet Salina, MD   Orders Received? Or Actions Taken?: Actions Taken: MD notified

## 2019-11-25 NOTE — Assessment & Plan Note (Addendum)
-   recurrent problem in past as well (historically on HCTZ per note review), now per med rec is on lasix PO 80 mg BID and metolazone 2.5 mg on Sun/Wed; possible that he is overdiuresed as noted with dilute urine in setting of diuretics - serum osmo 274 and urine osmo 311 -Tolvaptan ordered x1 per nephrology on 9/29. Repeat dose again on 10/1 - repeat dose of lasix also ordered per nephrology on 10/1, will follow up responses; seems to be improving  (had to give 500 cc NS bolus on 10/1 due to hypotension/lethargy which resolved after IVF) - nephrology following, greatly appreciate assistance - Na now improving further; responded well to lasix past 24 hours with increased UOP - Na 134 at discharge - d/c metolazone at discharge and resume on home lasix 80 mg BID

## 2019-11-26 ENCOUNTER — Inpatient Hospital Stay (HOSPITAL_COMMUNITY): Payer: Medicare Other

## 2019-11-26 DIAGNOSIS — I351 Nonrheumatic aortic (valve) insufficiency: Secondary | ICD-10-CM

## 2019-11-26 DIAGNOSIS — N1832 Chronic kidney disease, stage 3b: Secondary | ICD-10-CM

## 2019-11-26 DIAGNOSIS — I340 Nonrheumatic mitral (valve) insufficiency: Secondary | ICD-10-CM

## 2019-11-26 DIAGNOSIS — N179 Acute kidney failure, unspecified: Secondary | ICD-10-CM

## 2019-11-26 LAB — ECHOCARDIOGRAM COMPLETE
Area-P 1/2: 2.32 cm2
Height: 72 in
S' Lateral: 3.1 cm
Weight: 3573.22 oz

## 2019-11-26 LAB — RENAL FUNCTION PANEL
Albumin: 3.6 g/dL (ref 3.5–5.0)
Albumin: 3.6 g/dL (ref 3.5–5.0)
Anion gap: 12 (ref 5–15)
Anion gap: 13 (ref 5–15)
BUN: 70 mg/dL — ABNORMAL HIGH (ref 8–23)
BUN: 69 mg/dL — ABNORMAL HIGH (ref 8–23)
CO2: 28 mmol/L (ref 22–32)
CO2: 25 mmol/L (ref 22–32)
Calcium: 8.8 mg/dL — ABNORMAL LOW (ref 8.9–10.3)
Calcium: 8.6 mg/dL — ABNORMAL LOW (ref 8.9–10.3)
Chloride: 83 mmol/L — ABNORMAL LOW (ref 98–111)
Chloride: 82 mmol/L — ABNORMAL LOW (ref 98–111)
Creatinine, Ser: 2.14 mg/dL — ABNORMAL HIGH (ref 0.61–1.24)
Creatinine, Ser: 2.08 mg/dL — ABNORMAL HIGH (ref 0.61–1.24)
GFR calc Af Amer: 29 mL/min — ABNORMAL LOW (ref >60)
GFR calc Af Amer: 30 mL/min — ABNORMAL LOW (ref >60)
GFR calc non Af Amer: 25 mL/min — ABNORMAL LOW (ref >60)
GFR calc non Af Amer: 26 mL/min — ABNORMAL LOW (ref >60)
Glucose, Bld: 141 mg/dL — ABNORMAL HIGH (ref 70–99)
Glucose, Bld: 193 mg/dL — ABNORMAL HIGH (ref 70–99)
Phosphorus: 2.7 mg/dL (ref 2.5–4.6)
Phosphorus: 2.7 mg/dL (ref 2.5–4.6)
Potassium: 3.2 mmol/L — ABNORMAL LOW (ref 3.5–5.1)
Potassium: 3.2 mmol/L — ABNORMAL LOW (ref 3.5–5.1)
Sodium: 123 mmol/L — ABNORMAL LOW (ref 135–145)
Sodium: 120 mmol/L — ABNORMAL LOW (ref 135–145)

## 2019-11-26 LAB — SODIUM
Sodium: 124 mmol/L — ABNORMAL LOW (ref 135–145)
Sodium: 122 mmol/L — ABNORMAL LOW (ref 135–145)

## 2019-11-26 LAB — MAGNESIUM: Magnesium: 2.5 mg/dL — ABNORMAL HIGH (ref 1.7–2.4)

## 2019-11-26 MED ORDER — POTASSIUM CHLORIDE CRYS ER 20 MEQ PO TBCR
40.0000 meq | EXTENDED_RELEASE_TABLET | Freq: Once | ORAL | Status: AC
  Administered 2019-11-26: 40 meq via ORAL
  Filled 2019-11-26: qty 2

## 2019-11-26 MED ORDER — APIXABAN 2.5 MG PO TABS
2.5000 mg | ORAL_TABLET | Freq: Two times a day (BID) | ORAL | Status: DC
  Administered 2019-11-27 – 2019-11-30 (×6): 2.5 mg via ORAL
  Filled 2019-11-26 (×7): qty 1

## 2019-11-26 MED ORDER — ALUM & MAG HYDROXIDE-SIMETH 200-200-20 MG/5ML PO SUSP
30.0000 mL | Freq: Four times a day (QID) | ORAL | Status: DC | PRN
  Administered 2019-11-26: 30 mL via ORAL
  Filled 2019-11-26: qty 30

## 2019-11-26 MED ORDER — TOLVAPTAN 15 MG PO TABS
7.5000 mg | ORAL_TABLET | Freq: Once | ORAL | Status: AC
  Administered 2019-11-26: 7.5 mg via ORAL
  Filled 2019-11-26: qty 1

## 2019-11-26 MED ORDER — SODIUM CHLORIDE 0.9 % IN NEBU
3.0000 mL | INHALATION_SOLUTION | Freq: Two times a day (BID) | RESPIRATORY_TRACT | Status: DC
  Administered 2019-11-27 – 2019-11-30 (×6): 3 mL via RESPIRATORY_TRACT
  Filled 2019-11-26 (×9): qty 3

## 2019-11-26 NOTE — Progress Notes (Signed)
PROGRESS NOTE    [name]   CHE:[id number]  DOB: [birth year]  DOA: 11/24/2019     2  PCP: [name], MD  CC: fatigue, hyponatremia  Hospital Course: Mr. Baumgardner is a 86 yo CM with PMH hard of hearing, obesity, macular degeneration, hypothyroidism, hyponatremia, hypertension, gout, GERD, CHF, BPH, A. fib, heart block s/p pacer who presented to the hospital with complaints of fatigue. Symptoms had been persistent for approximately 1 week prior to hospitalization. He has a caregiver at home and also recently was evaluated by his PCP prior to admission and found to be hyponatremic. Outpatient remedies were unsuccessful and repeat sodium level had not improved and he was referred to the ER for further evaluation.  On work-up in the ER his sodium was 115. He was started on normal saline and BMPs were trended. Nephrology was also consulted while hospitalized.   Interval History:  No events overnight.  Staff reports some ongoing mild hematuria.  Eliquis being held for this morning again. He otherwise has no complaints this morning other than some mild shortness of breath, no chest pain.  Still voiding well.  Daughter present bedside this morning.  Old records reviewed in assessment of this patient  ROS: Constitutional: negative for chills and fevers, Respiratory: negative for cough, Cardiovascular: negative for chest pain and Gastrointestinal: negative for abdominal pain  Assessment & Plan: * Hyponatremia - recurrent problem in past as well (historically on HCTZ per note review), now per med rec is on lasix PO 80 mg BID and metolazone 2.5 mg on Sun/Wed; possible that he is overdiuresed as noted with dilute urine in setting of diuretics - serum osmo 274 and urine osmo 311 -Continue holding diuretics.  IV fluids discontinued per nephrology -Tolvaptan ordered x1 per nephrology today - nephrology following, appreciate assistance  Chronic systolic CHF (congestive heart failure)  ([place]) -Unable to locate prior echoes for review -Given large amount of Lasix dose at home, will repeat echo now to reevaluate EF as well as diastolic function - EF 53-61%, indeterminate diastology grade - would expect to be able to reduce home lasix dose at discharge  Acute renal failure superimposed on stage 3b chronic kidney disease (HCC) -Baseline creatinine around 2 -Creatinine is 2.55 on admission and improving with fluids -Continue trending BMP -Patient plans to establish outpatient with nephrology at discharge  Atrial fibrillation ([place]) -Continue bisoprolol - holding eliquis for now given hematuria; voiding well  BPH (benign prostatic hyperplasia) -Continue finasteride  Hypothyroidism -Continue Synthroid  Essential hypertension -Continue bisoprolol, hydralazine, Imdur -Continue holding irbesartan  Normocytic anemia -No evidence of bleeding -Follow-up iron studies  Hypokalemia -Replete and recheck as needed   Antimicrobials: None  DVT prophylaxis: Eliquis Code Status: DNR Family Communication: Daughter bedside Disposition Plan: Status is: Inpatient  Remains inpatient appropriate because:IV treatments appropriate due to intensity of illness or inability to take PO and Inpatient level of care appropriate due to severity of illness   Dispo: The patient is from: Home              Anticipated d/c is to: Home              Anticipated d/c date is: 2 days              Patient currently is not medically stable to d/c.  Objective: Blood pressure (!) 142/64, pulse 63, temperature 97.7 F (36.5 C), temperature source Oral, resp. rate 17, height 6' (1.829 m), weight 101.3 kg, SpO2 94 %.  Examination: General appearance: Pleasant elderly man resting in bed with daughter bedside and he is in no distress.  Head: Normocephalic, without obvious abnormality, atraumatic Eyes: EOMI Lungs: Mildly coarse breath sounds anterior lung fields Heart: regular rate and rhythm and  S1, S2 normal Abdomen: normal findings: bowel sounds normal and soft, non-tender Extremities: Trace lower extremity edema Skin: mobility and turgor normal Neurologic: Grossly normal  Consultants:   Nephrology  Procedures:   None  Data Reviewed: I have personally reviewed following labs and imaging studies Results for orders placed or performed during the hospital encounter of 11/24/19 (from the past 24 hour(s))  Renal function panel     Status: Abnormal   Collection Time: 11/25/19  2:54 PM  Result Value Ref Range   Sodium 119 (LL) 135 - 145 mmol/L   Potassium 3.4 (L) 3.5 - 5.1 mmol/L   Chloride 79 (L) 98 - 111 mmol/L   CO2 28 22 - 32 mmol/L   Glucose, Bld 191 (H) 70 - 99 mg/dL   BUN 70 (H) 8 - 23 mg/dL   Creatinine, Ser 2.33 (H) 0.61 - 1.24 mg/dL   Calcium 8.8 (L) 8.9 - 10.3 mg/dL   Phosphorus 3.1 2.5 - 4.6 mg/dL   Albumin 3.8 3.5 - 5.0 g/dL   GFR calc non Af Amer 23 (L) >60 mL/min   GFR calc Af Amer 27 (L) >60 mL/min   Anion gap 12 5 - 15  Renal function panel     Status: Abnormal   Collection Time: 11/25/19  9:13 PM  Result Value Ref Range   Sodium 119 (LL) 135 - 145 mmol/L   Potassium 3.3 (L) 3.5 - 5.1 mmol/L   Chloride 80 (L) 98 - 111 mmol/L   CO2 27 22 - 32 mmol/L   Glucose, Bld 168 (H) 70 - 99 mg/dL   BUN 72 (H) 8 - 23 mg/dL   Creatinine, Ser 2.08 (H) 0.61 - 1.24 mg/dL   Calcium 8.7 (L) 8.9 - 10.3 mg/dL   Phosphorus 2.8 2.5 - 4.6 mg/dL   Albumin 3.4 (L) 3.5 - 5.0 g/dL   GFR calc non Af Amer 26 (L) >60 mL/min   GFR calc Af Amer 30 (L) >60 mL/min   Anion gap 12 5 - 15  Renal function panel     Status: Abnormal   Collection Time: 11/26/19  4:57 AM  Result Value Ref Range   Sodium 123 (L) 135 - 145 mmol/L   Potassium 3.2 (L) 3.5 - 5.1 mmol/L   Chloride 83 (L) 98 - 111 mmol/L   CO2 28 22 - 32 mmol/L   Glucose, Bld 141 (H) 70 - 99 mg/dL   BUN 70 (H) 8 - 23 mg/dL   Creatinine, Ser 2.14 (H) 0.61 - 1.24 mg/dL   Calcium 8.8 (L) 8.9 - 10.3 mg/dL   Phosphorus 2.7  2.5 - 4.6 mg/dL   Albumin 3.6 3.5 - 5.0 g/dL   GFR calc non Af Amer 25 (L) >60 mL/min   GFR calc Af Amer 29 (L) >60 mL/min   Anion gap 12 5 - 15  Magnesium     Status: Abnormal   Collection Time: 11/26/19  4:57 AM  Result Value Ref Range   Magnesium 2.5 (H) 1.7 - 2.4 mg/dL  Renal function panel     Status: Abnormal   Collection Time: 11/26/19 10:35 AM  Result Value Ref Range   Sodium 120 (L) 135 - 145 mmol/L   Potassium 3.2 (L) 3.5 - 5.1  mmol/L   Chloride 82 (L) 98 - 111 mmol/L   CO2 25 22 - 32 mmol/L   Glucose, Bld 193 (H) 70 - 99 mg/dL   BUN 69 (H) 8 - 23 mg/dL   Creatinine, Ser 2.08 (H) 0.61 - 1.24 mg/dL   Calcium 8.6 (L) 8.9 - 10.3 mg/dL   Phosphorus 2.7 2.5 - 4.6 mg/dL   Albumin 3.6 3.5 - 5.0 g/dL   GFR calc non Af Amer 26 (L) >60 mL/min   GFR calc Af Amer 30 (L) >60 mL/min   Anion gap 13 5 - 15    Recent Results (from the past 240 hour(s))  Respiratory Panel by RT PCR (Flu A&B, Covid) - Nasopharyngeal Swab     Status: None   Collection Time: 11/24/19 12:16 PM   Specimen: Nasopharyngeal Swab  Result Value Ref Range Status   SARS Coronavirus 2 by RT PCR NEGATIVE NEGATIVE Final    Comment: (NOTE) SARS-CoV-2 target nucleic acids are NOT DETECTED.  The SARS-CoV-2 RNA is generally detectable in upper respiratoy specimens during the acute phase of infection. The lowest concentration of SARS-CoV-2 viral copies this assay can detect is 131 copies/mL. A negative result does not preclude SARS-Cov-2 infection and should not be used as the sole basis for treatment or other patient management decisions. A negative result may occur with  improper specimen collection/handling, submission of specimen other than nasopharyngeal swab, presence of viral mutation(s) within the areas targeted by this assay, and inadequate number of viral copies (<131 copies/mL). A negative result must be combined with clinical observations, patient history, and epidemiological information. The expected  result is Negative.  Fact Sheet for Patients:  PinkCheek.be  Fact Sheet for Healthcare Providers:  GravelBags.it  This test is no t yet approved or cleared by the [place] FDA and  has been authorized for detection and/or diagnosis of SARS-CoV-2 by FDA under an Emergency Use Authorization (EUA). This EUA will remain  in effect (meaning this test can be used) for the duration of the COVID-19 declaration under Section 564(b)(1) of the Act, 21 U.S.C. section 360bbb-3(b)(1), unless the authorization is terminated or revoked sooner.     Influenza A by PCR NEGATIVE NEGATIVE Final   Influenza B by PCR NEGATIVE NEGATIVE Final    Comment: (NOTE) The Xpert Xpress SARS-CoV-2/FLU/RSV assay is intended as an aid in  the diagnosis of influenza from Nasopharyngeal swab specimens and  should not be used as a sole basis for treatment. Nasal washings and  aspirates are unacceptable for Xpert Xpress SARS-CoV-2/FLU/RSV  testing.  Fact Sheet for Patients: PinkCheek.be  Fact Sheet for Healthcare Providers: GravelBags.it  This test is not yet approved or cleared by the [place] FDA and  has been authorized for detection and/or diagnosis of SARS-CoV-2 by  FDA under an Emergency Use Authorization (EUA). This EUA will remain  in effect (meaning this test can be used) for the duration of the  Covid-19 declaration under Section 564(b)(1) of the Act, 21  U.S.C. section 360bbb-3(b)(1), unless the authorization is  terminated or revoked. Performed at [facility], [place] [address] [place], [place] [address]      Radiology Studies: DG CHEST PORT 1 VIEW  Result Date: 11/24/2019 CLINICAL DATA:  Seizure EXAM: PORTABLE CHEST 1 VIEW COMPARISON:  11/04/2019, 11/13/2017, 09/16/2019 FINDINGS: Left-sided pacing device as before. Enlarged cardiomediastinal silhouette with  mild vascular congestion. Mild diffuse chronic interstitial opacity. No consolidation or effusion. No pneumothorax. IMPRESSION: Cardiomegaly with mild vascular congestion. No  confluent airspace disease. Electronically Signed   By: Donavan Foil M.D.   On: 11/24/2019 18:40   ECHOCARDIOGRAM COMPLETE  Result Date: 11/26/2019    ECHOCARDIOGRAM REPORT   Patient Name:   [name] [facility] Date of Exam: 11/26/2019 Medical Rec #:  [id number]         Height:       72.0 in Accession #:    [id number]        Weight:       223.3 lb Date of Birth:  1922/07/12          BSA:          2.233 m Patient Age:    89 years          BP:           148/63 mmHg Patient Gender: M                 HR:           64 bpm. Exam Location:  Inpatient Procedure: 2D Echo Indications:    dyspnea  History:        Patient has no prior history of Echocardiogram examinations.                 Pacemaker; Risk Factors:Former Smoker and Hypertension. Hx of                 ablation.  Sonographer:    Jannett Celestine RDCS (AE) Referring Phys: [place]  Sonographer Comments: Restricted mobility. off axis apical windows. unable to reposition patient. exam performed supine IMPRESSIONS  1. Left ventricular ejection fraction, by estimation, is 45 to 50%. The left ventricle has mildly decreased function. The left ventricle has no regional wall motion abnormalities. There is mild left ventricular hypertrophy. Left ventricular diastolic parameters are indeterminate.  2. Right ventricular systolic function is normal. The right ventricular size is mildly enlarged. There is moderately elevated pulmonary artery systolic pressure. The estimated right ventricular systolic pressure is 75.1 mmHg.  3. The mitral valve is degenerative. Mild mitral valve regurgitation. No evidence of mitral stenosis. Moderate mitral annular calcification.  4. The aortic valve is normal in structure. Aortic valve regurgitation is mild. No aortic stenosis is present.  5. Aortic dilatation  noted. There is borderline dilatation at the level of the sinuses of Valsalva, measuring 38 mm.  6. The inferior vena cava is normal in size with greater than 50% respiratory variability, suggesting right atrial pressure of 3 mmHg. FINDINGS  Left Ventricle: Left ventricular ejection fraction, by estimation, is 45 to 50%. The left ventricle has mildly decreased function. The left ventricle has no regional wall motion abnormalities. The left ventricular internal cavity size was normal in size. There is mild left ventricular hypertrophy. Abnormal (paradoxical) septal motion, consistent with RV pacemaker. Left ventricular diastolic parameters are indeterminate.  LV Wall Scoring: The entire apex is akinetic. The mid inferoseptal segment is hypokinetic. The basal anteroseptal segment, basal inferolateral segment, basal anterolateral segment, basal anterior segment, basal inferior segment, and basal inferoseptal segment are normal. Right Ventricle: The right ventricular size is mildly enlarged. No increase in right ventricular wall thickness. Right ventricular systolic function is normal. There is moderately elevated pulmonary artery systolic pressure. The tricuspid regurgitant velocity is 3.66 m/s, and with an assumed right atrial pressure of 3 mmHg, the estimated right ventricular systolic pressure is 70.0 mmHg. Left Atrium: Left atrial size was normal in size. Right Atrium: Right atrial size was normal in  size. Pericardium: There is no evidence of pericardial effusion. Mitral Valve: The mitral valve is degenerative in appearance. There is mild thickening of the mitral valve leaflet(s). There is mild calcification of the mitral valve leaflet(s). Moderate mitral annular calcification. Mild mitral valve regurgitation. No evidence of mitral valve stenosis. Tricuspid Valve: The tricuspid valve is normal in structure. Tricuspid valve regurgitation is trivial. No evidence of tricuspid stenosis. Aortic Valve: The aortic valve  is normal in structure. Aortic valve regurgitation is mild. No aortic stenosis is present. Pulmonic Valve: The pulmonic valve was normal in structure. Pulmonic valve regurgitation is trivial. No evidence of pulmonic stenosis. Aorta: Aortic dilatation noted. There is borderline dilatation at the level of the sinuses of Valsalva, measuring 38 mm. Venous: The inferior vena cava is normal in size with greater than 50% respiratory variability, suggesting right atrial pressure of 3 mmHg. IAS/Shunts: No atrial level shunt detected by color flow Doppler. Additional Comments: A pacer wire is visualized.  LEFT VENTRICLE PLAX 2D LVIDd:         4.50 cm LVIDs:         3.10 cm LV PW:         1.30 cm LV IVS:        1.40 cm LVOT diam:     2.30 cm LV SV:         54 LV SV Index:   24 LVOT Area:     4.15 cm  RIGHT VENTRICLE RV S prime:     8.70 cm/s TAPSE (M-mode): 1.5 cm LEFT ATRIUM           Index       RIGHT ATRIUM           Index LA diam:      3.60 cm 1.61 cm/m  RA Area:     19.90 cm LA Vol (A4C): 39.5 ml 17.69 ml/m RA Volume:   49.50 ml  22.17 ml/m  AORTIC VALVE LVOT Vmax:   59.40 cm/s LVOT Vmean:  37.900 cm/s LVOT VTI:    0.130 m  AORTA Ao Root diam: 3.80 cm MITRAL VALVE               TRICUSPID VALVE MV Area (PHT): 2.32 cm    TR Peak grad:   53.6 mmHg MV Decel Time: 327 msec    TR Vmax:        366.00 cm/s MV E velocity: 79.70 cm/s                            SHUNTS                            Systemic VTI:  0.13 m                            Systemic Diam: 2.30 cm Candee Furbish MD Electronically signed by Candee Furbish MD Signature Date/Time: 11/26/2019/11:31:27 AM    Final    DG CHEST PORT 1 VIEW  Final Result      Scheduled Meds: . allopurinol  100 mg Oral Daily  . [START ON 11/27/2019] apixaban  2.5 mg Oral BID  . bisoprolol  10 mg Oral BID  . Chlorhexidine Gluconate Cloth  6 each Topical Daily  . finasteride  5 mg Oral Daily  . hydrALAZINE  50 mg Oral BID  . isosorbide mononitrate  60 mg Oral Daily  .  levothyroxine  112 mcg Oral Q0600  . mometasone-formoterol  2 puff Inhalation BID  . potassium chloride  40 mEq Oral Once  . temazepam  15 mg Oral QHS  . tolvaptan  7.5 mg Oral Once   PRN Meds: acetaminophen **OR** acetaminophen, ondansetron **OR** ondansetron (ZOFRAN) IV, prochlorperazine, senna-docusate Continuous Infusions:     LOS: 2 days  Time spent: Greater than 50% of the 35 minute visit was spent in counseling/coordination of care for the patient as laid out in the A&P.   Dwyane Dee, MD [facility] 11/26/2019, 1:44 PM  Contact via secure chat.  To contact the attending provider between 7A-7P or the covering provider during after hours 7P-7A, please log into the web site [email or web address] and access using universal [place] password for that web site. If you do not have the password, please call the hospital operator.

## 2019-11-26 NOTE — Progress Notes (Signed)
  Echocardiogram 2D Echocardiogram has been performed.  [name] 11/26/2019, 10:03 AM

## 2019-11-26 NOTE — Evaluation (Signed)
Physical Therapy Evaluation Patient Details Name: [name] MRN: [id number] DOB: [birth year] Today's Date: 11/26/2019   History of Present Illness  86 yo male admitted with hyponatremia. Hx of Sz, CHF, CKD, A fib, macular degeneration, pacemaker, obesity, gout  Clinical Impression  On eval, pt required Min-Mod assist for mobility. He walked ~20 feet with use of a RW. Pt presents with general weakness, decreased activity tolerance, and impaired gait and balance. He is motivated to regain his strength. Discussed d/c plan with family-plan is for return home. Pt has 24/7 caregiver. They are agreeable to HHPT f/u. Will plan to follow and progress activity as tolerated.     Follow Up Recommendations Home health PT;Supervision/Assistance - 24 hour    Equipment Recommendations  None recommended by PT    Recommendations for Other Services       Precautions / Restrictions Precautions Precautions: Fall Restrictions Weight Bearing Restrictions: No      Mobility  Bed Mobility               General bed mobility comments: oob in recliner  Transfers Overall transfer level: Needs assistance Equipment used: Rolling walker (2 wheeled) Transfers: Sit to/from Stand Sit to Stand: Mod assist         General transfer comment: Assist to power up, stabilize, control descent. VCs safety, technique, hand placement. Increased time.  Ambulation/Gait Ambulation/Gait assistance: Min assist Gait Distance (Feet): 20 Feet Assistive device: Rolling walker (2 wheeled) Gait Pattern/deviations: Decreased step length - right;Decreased step length - left;Decreased stride length;Trunk flexed     General Gait Details: Assist to stabilize pt throughout distance. Followed closely with recliner. Cues for safety. Pt fatigues easily.  Stairs            Wheelchair Mobility    Modified Rankin (Stroke Patients Only)       Balance Overall balance assessment: Needs assistance          Standing balance support: Bilateral upper extremity supported Standing balance-Leahy Scale: Poor                               Pertinent Vitals/Pain Pain Assessment: No/denies pain    Home Living Family/patient expects to be discharged to:: Private residence Living Arrangements: Alone Available Help at Discharge: Available 24 hours/day;Personal care attendant         Home Layout: One level Home Equipment: Walker - 2 wheels;Electric scooter      Prior Function Level of Independence: Needs assistance   Gait / Transfers Assistance Needed: uses RW for short distance. scooter for longer distances  ADL's / Homemaking Assistance Needed: aide assists as needed        Hand Dominance        Extremity/Trunk Assessment   Upper Extremity Assessment Upper Extremity Assessment: Generalized weakness    Lower Extremity Assessment Lower Extremity Assessment: Generalized weakness    Cervical / Trunk Assessment Cervical / Trunk Assessment: Kyphotic  Communication   Communication: HOH  Cognition Arousal/Alertness: Awake/alert Behavior During Therapy: WFL for tasks assessed/performed Overall Cognitive Status: Difficult to assess                                 General Comments: appears [facility]      General Comments      Exercises     Assessment/Plan    PT Assessment Patient needs continued PT services  PT Problem List Decreased strength;Decreased mobility;Decreased balance;Decreased activity tolerance       PT Treatment Interventions DME instruction;Gait training;Therapeutic activities;Therapeutic exercise;Patient/family education;Balance training;Functional mobility training    PT Goals (Current goals can be found in the Care Plan section)  Acute Rehab PT Goals Patient Stated Goal: to get strength back. to return home. PT Goal Formulation: With patient/family Time For Goal Achievement: 12/10/19 Potential to Achieve Goals: Good     Frequency Min 3X/week   Barriers to discharge        Co-evaluation               AM-PAC PT "6 Clicks" Mobility  Outcome Measure Help needed turning from your back to your side while in a flat bed without using bedrails?: A Little Help needed moving from lying on your back to sitting on the side of a flat bed without using bedrails?: A Lot Help needed moving to and from a bed to a chair (including a wheelchair)?: A Little Help needed standing up from a chair using your arms (e.g., wheelchair or bedside chair)?: A Little Help needed to walk in hospital room?: A Little Help needed climbing 3-5 steps with a railing? : A Lot 6 Click Score: 16    End of Session Equipment Utilized During Treatment: Gait belt Activity Tolerance: Patient limited by fatigue Patient left: in chair;with call bell/phone within reach;with chair alarm set;with family/visitor present   PT Visit Diagnosis: Muscle weakness (generalized) (M62.81);Difficulty in walking, not elsewhere classified (R26.2)    Time: 5974-1638 PT Time Calculation (min) (ACUTE ONLY): 12 min   Charges:   PT Evaluation $PT Eval Low Complexity: [place], PT Acute Rehabilitation  Office: [phone] Pager: [phone]

## 2019-11-26 NOTE — Progress Notes (Signed)
[facility] Progress Note  Name: [name] MRN: [id number] DOB: 1922-10-20  Chief Complaint:  Low sodium at PCP   Subjective:  Tired - didn't sleep much last night.  Spoke with his daughter at bedside.  He doesn't have a nephrologist and has asked that we follow on discharge.  He is able to drink when thirsty.  Review of systems:   A little shortness of breath; no chest pain  Denies vomiting but had nausea earlier -just tired now  ------------ Background on consult:  [name] is a 86 y.o. male with a history of BPH, CKD, chronic combined systolic and diastolic CHF, and HTN who presented to the hospital after abnormal labs at his physician's office.  He was found to have a sodium of 112 on labs with PCP per report.  He received 1 L normal saline bolus in the ER.  Sodium went from 115 prebolus to 118 after the bolus.  He is on Lasix and metolazone as below.  Note that he has a history of prior hyponatremia per charting.  Baseline Cr near 2 recently.  He had the labs today at his PCP's office.  His daughter is at bedside and serves as Risk manager.  He has had some nausea and has gotten a PRN ordered.  His daughter states that she has noticed some confusion.  The patient states that he was taken off of his Lasix on Friday 9/24 as he was not eating well.  He has been trying to drink lots of Gatorade.  He initially tells me he has never had a suprapubic catheter and then recalls that yes at one point he did.     Intake/Output Summary (Last 24 hours) at 11/26/2019 1233 Last data filed at 11/26/2019 1019 Gross per 24 hour  Intake 875.47 ml  Output 1025 ml  Net -149.53 ml    Vitals:  Vitals:   11/26/19 0548 11/26/19 0905 11/26/19 1005 11/26/19 1017  BP: (!) 148/63  (!) 168/76   Pulse: (!) 59  67 67  Resp: 17     Temp: 98.4 F (36.9 C)     TempSrc:      SpO2: 100% 98%  99%  Weight: 101.3 kg     Height:         Physical Exam:  General: elderly male in  bed in NAD HEENT: NCAT Eyes: left eyelid a little swollen; chronic per pt/daughter Neck: no JVD supple trachea midline Heart: S1S2 no rub Lungs: clear to auscultation and unlabored on room air Abdomen: soft nontender obese habitus no suprapubic Extremities: no pitting edema appreciated Neuro:sleepy but conversant; hard of hearing provides hx  Psych normal mood and affect GU foley in place; hematuria  Medications reviewed   Labs:  BMP Latest Ref Rng & Units 11/26/2019 11/26/2019 11/25/2019  Glucose 70 - 99 mg/dL 193(H) 141(H) 168(H)  BUN 8 - 23 mg/dL 69(H) 70(H) 72(H)  Creatinine 0.61 - 1.24 mg/dL 2.08(H) 2.14(H) 2.08(H)  BUN/Creat Ratio 10 - 24 - - -  Sodium 135 - 145 mmol/L 120(L) 123(L) 119(LL)  Potassium 3.5 - 5.1 mmol/L 3.2(L) 3.2(L) 3.3(L)  Chloride 98 - 111 mmol/L 82(L) 83(L) 80(L)  CO2 22 - 32 mmol/L 25 28 27   Calcium 8.9 - 10.3 mg/dL 8.6(L) 8.8(L) 8.7(L)     Assessment/Plan:   #Hyponatremia - Improving on presentation to ER and s/p 1 L bolus normal saline and NS at 50 /hr; NS was stopped when Na reached 122 per order  -  stop normal saline  - tolvaptan 7.5 mg once  - check sodium every 6 hours - notify renal MD if rises to 130 or above before 9/30 at 7am  # Urinary retention - hx BPH on finasteride - Would continue Foley catheter   CKD stage 4 - baseline Cr is 2    - will est care with [facility] on discharge   HTN  - follow on current regimen - plan for tolvaptan today   Chronic combined systolic and diastolic CHF  - noted on outpatient diuretics - on hold  - caution with hydration    Hypokalemia  - replete  Spoke with nurse and daughter at bedside.    Claudia Desanctis, MD 11/26/2019 12:53 PM

## 2019-11-26 NOTE — TOC Initial Note (Signed)
Transition of Care [facility]) - Initial/Assessment Note    Patient Details  Name: [name] MRN: [id number] Date of Birth: [birth year]  [facility] CM/SW Contact:    Dessa Phi, RN Phone Number: 11/26/2019, 1:18 PM  Clinical Narrative:  PT eval-await recc.                 Expected Discharge Plan: [place] Barriers to Discharge: Continued Medical Work up   Patient Goals and CMS Choice Patient states their goals for this hospitalization and ongoing recovery are:: go home CMS Medicare.gov Compare Post Acute Care list provided to:: Patient Represenative (must comment) Choice offered to / list presented to : Adult Children  Expected Discharge Plan and Services Expected Discharge Plan: [place]   Discharge Planning Services: CM Consult   Living arrangements for the past 2 months: Single Family Home                                      Prior Living Arrangements/Services Living arrangements for the past 2 months: Single Family Home Lives with:: Adult Children              Current home services: DME    Activities of Daily Living Home Assistive Devices/Equipment: CBG Meter, Blood pressure cuff, Eyeglasses, Walker (specify type), Other (Comment), Hearing aid (motorized wheelchair, 4 wheeled walker, bilateral hearing aids) ADL Screening (condition at time of admission) Patient's cognitive ability adequate to safely complete daily activities?: No Is the patient deaf or have difficulty hearing?: Yes (wears bilateral hearing aids) Does the patient have difficulty seeing, even when wearing glasses/contacts?: Yes (blind in left eye) Does the patient have difficulty concentrating, remembering, or making decisions?: Yes Patient able to express need for assistance with ADLs?: Yes Does the patient have difficulty dressing or bathing?: Yes Independently performs ADLs?: No Communication: Independent Dressing (OT): Needs  assistance Is this a change from baseline?: Pre-admission baseline Grooming: Needs assistance Is this a change from baseline?: Pre-admission baseline Feeding: Needs assistance Is this a change from baseline?: Pre-admission baseline Bathing: Needs assistance Is this a change from baseline?: Pre-admission baseline Toileting: Dependent Is this a change from baseline?: Change from baseline, expected to last >3days In/Out Bed: Dependent Is this a change from baseline?: Change from baseline, expected to last >3 days Walks in Home: Dependent Is this a change from baseline?: Change from baseline, expected to last >3 days Does the patient have difficulty walking or climbing stairs?: Yes (does not do stairs) Weakness of Legs: Both Weakness of Arms/Hands: Both  Permission Sought/Granted                  Emotional Assessment              Admission diagnosis:  Hyponatremia [E87.1] Patient Active Problem List   Diagnosis Date Noted  . Acute renal failure superimposed on stage 3b chronic kidney disease ([place]) 11/25/2019  . Hypokalemia 11/25/2019  . Normocytic anemia 11/25/2019  . Essential hypertension 11/25/2019  . Hypothyroidism 11/25/2019  . BPH (benign prostatic hyperplasia) 11/25/2019  . Chronic CHF ([place]) 11/25/2019  . Hyponatremia 11/24/2019  . Borderline diabetes mellitus 11/08/2012  . Long term current use of anticoagulant therapy 11/07/2012  . Chronic diastolic heart failure ([place]) 11/07/2012  . Lumbar disc disease 11/07/2012  . Obesity (BMI 30-39.9) 11/07/2012  . Acute hyponatremia 11/04/2012  . BPH with urinary  obstruction 11/04/2012  . Atrial fibrillation ([place]) 07/18/2011  . Pacemaker-Medtronic   . Intrinsic asthma, unspecified 11/29/2007  . G E R D 11/14/2007  . Hypertension    PCP:  [name], MD Pharmacy:   [facility] [place], [place] - [address] AT [address] [place] [address] Phone:  [phone] Fax: [place], [address] [place], [address] [place], [address] [place] [address] Phone: [phone] Fax: [phone]     Social Determinants of Health (SDOH) Interventions    Readmission Risk Interventions No flowsheet data found.

## 2019-11-27 LAB — CBC WITH DIFFERENTIAL/PLATELET
Abs Immature Granulocytes: 0.08 10*3/uL — ABNORMAL HIGH (ref 0.00–0.07)
Basophils Absolute: 0 10*3/uL (ref 0.0–0.1)
Basophils Relative: 0 %
Eosinophils Absolute: 0 10*3/uL (ref 0.0–0.5)
Eosinophils Relative: 0 %
HCT: 28.4 % — ABNORMAL LOW (ref 39.0–52.0)
Hemoglobin: 9.9 g/dL — ABNORMAL LOW (ref 13.0–17.0)
Immature Granulocytes: 1 %
Lymphocytes Relative: 11 %
Lymphs Abs: 1.5 10*3/uL (ref 0.7–4.0)
MCH: 31.6 pg (ref 26.0–34.0)
MCHC: 34.9 g/dL (ref 30.0–36.0)
MCV: 90.7 fL (ref 80.0–100.0)
Monocytes Absolute: 1.2 10*3/uL — ABNORMAL HIGH (ref 0.1–1.0)
Monocytes Relative: 8 %
Neutro Abs: 11.8 10*3/uL — ABNORMAL HIGH (ref 1.7–7.7)
Neutrophils Relative %: 80 %
Platelets: 175 10*3/uL (ref 150–400)
RBC: 3.13 MIL/uL — ABNORMAL LOW (ref 4.22–5.81)
RDW: 14 % (ref 11.5–15.5)
WBC: 14.7 10*3/uL — ABNORMAL HIGH (ref 4.0–10.5)
nRBC: 0 % (ref 0.0–0.2)

## 2019-11-27 LAB — RENAL FUNCTION PANEL
Albumin: 3.4 g/dL — ABNORMAL LOW (ref 3.5–5.0)
Anion gap: 11 (ref 5–15)
BUN: 65 mg/dL — ABNORMAL HIGH (ref 8–23)
CO2: 26 mmol/L (ref 22–32)
Calcium: 8.8 mg/dL — ABNORMAL LOW (ref 8.9–10.3)
Chloride: 86 mmol/L — ABNORMAL LOW (ref 98–111)
Creatinine, Ser: 1.89 mg/dL — ABNORMAL HIGH (ref 0.61–1.24)
GFR calc Af Amer: 34 mL/min — ABNORMAL LOW (ref >60)
GFR calc non Af Amer: 30 mL/min — ABNORMAL LOW (ref >60)
Glucose, Bld: 147 mg/dL — ABNORMAL HIGH (ref 70–99)
Phosphorus: 1.9 mg/dL — ABNORMAL LOW (ref 2.5–4.6)
Potassium: 3.7 mmol/L (ref 3.5–5.1)
Sodium: 123 mmol/L — ABNORMAL LOW (ref 135–145)

## 2019-11-27 LAB — IRON AND TIBC
Iron: 140 ug/dL (ref 45–182)
Saturation Ratios: 41 % — ABNORMAL HIGH (ref 17.9–39.5)
TIBC: 342 ug/dL (ref 250–450)
UIBC: 202 ug/dL

## 2019-11-27 LAB — SODIUM
Sodium: 123 mmol/L — ABNORMAL LOW (ref 135–145)
Sodium: 125 mmol/L — ABNORMAL LOW (ref 135–145)

## 2019-11-27 LAB — MAGNESIUM: Magnesium: 2.7 mg/dL — ABNORMAL HIGH (ref 1.7–2.4)

## 2019-11-27 MED ORDER — FUROSEMIDE 10 MG/ML IJ SOLN
60.0000 mg | Freq: Once | INTRAMUSCULAR | Status: AC
  Administered 2019-11-27: 60 mg via INTRAVENOUS
  Filled 2019-11-27: qty 6

## 2019-11-27 MED ORDER — SODIUM PHOSPHATES 45 MMOLE/15ML IV SOLN
30.0000 mmol | Freq: Once | INTRAVENOUS | Status: AC
  Administered 2019-11-27: 30 mmol via INTRAVENOUS
  Filled 2019-11-27: qty 10

## 2019-11-27 NOTE — Progress Notes (Signed)
Wrong area

## 2019-11-27 NOTE — Progress Notes (Signed)
[facility] Progress Note  Name: [name] MRN: [id number] DOB: 1922-06-03  Chief Complaint:  Low sodium at PCP   Subjective:  Got tolvaptan 7.5 mg once on 9/29 at 1359 per charting.  He has had some trouble with his breathing today per his caregiver - better with repositioning.  His dog has joined him at bedside as well.   Review of systems:   A little shortness of breath; no chest pain  Denies vomiting; nausea earlier but not now  ------------ Background on consult:  [name] is a 86 y.o. male with a history of BPH, CKD, chronic combined systolic and diastolic CHF, and HTN who presented to the hospital after abnormal labs at his physician's office.  He was found to have a sodium of 112 on labs with PCP per report.  He received 1 L normal saline bolus in the ER.  Sodium went from 115 prebolus to 118 after the bolus.  He is on Lasix and metolazone as below.  Note that he has a history of prior hyponatremia per charting.  Baseline Cr near 2 recently.  He had the labs today at his PCP's office.  His daughter is at bedside and serves as Risk manager.  He has had some nausea and has gotten a PRN ordered.  His daughter states that she has noticed some confusion.  The patient states that he was taken off of his Lasix on Friday 9/24 as he was not eating well.  He has been trying to drink lots of Gatorade.  He initially tells me he has never had a suprapubic catheter and then recalls that yes at one point he did.     Intake/Output Summary (Last 24 hours) at 11/27/2019 1352 Last data filed at 11/27/2019 1316 Gross per 24 hour  Intake 600 ml  Output 1575 ml  Net -975 ml    Vitals:  Vitals:   11/26/19 2343 11/27/19 0422 11/27/19 0818 11/27/19 1312  BP: (!) 154/69 (!) 112/58  (!) 119/53  Pulse: 60 60  65  Resp: (!) 22 16  20   Temp: 98.1 F (36.7 C) 98.7 F (37.1 C)  97.6 F (36.4 C)  TempSrc: Oral Oral  Oral  SpO2: 100% 99% 93% 96%  Weight:      Height:          Physical Exam:  General: elderly male in bed in NAD HEENT: NCAT Eyes: left eyelid a little swollen Neck: no JVD supple trachea midline Heart: S1S2 no rub Lungs: crackles and transmitted upper airway sounds  Abdomen: soft nontender obese habitus no suprapubic Extremities: no pitting edema appreciated Neuro:sleepy but conversant; hard of hearing provides hx  Psych normal mood and affect GU foley in place; hematuria  Medications reviewed   Labs:  BMP Latest Ref Rng & Units 11/27/2019 11/27/2019 11/27/2019  Glucose 70 - 99 mg/dL - 147(H) -  BUN 8 - 23 mg/dL - 65(H) -  Creatinine 0.61 - 1.24 mg/dL - 1.89(H) -  BUN/Creat Ratio 10 - 24 - - -  Sodium 135 - 145 mmol/L 125(L) 123(L) 123(L)  Potassium 3.5 - 5.1 mmol/L - 3.7 -  Chloride 98 - 111 mmol/L - 86(L) -  CO2 22 - 32 mmol/L - 26 -  Calcium 8.9 - 10.3 mg/dL - 8.8(L) -     Assessment/Plan:   #Hyponatremia - Improving on presentation to ER and s/p 1 L bolus normal saline and NS at 50 /hr; NS was stopped when Na reached 122  per order.  tolvaptan 7.5 mg once on 9/29 - Lasix 60 mg IV once now  - BMP in AM  # Urinary retention - hx BPH on finasteride - Trial of discontinuing foley catheter; monitor for retention per nursing protocol; normal residuals and BPH per urology   # CKD stage 4 - baseline Cr is 2    - they request follow-up in office with [facility] on discharge    #HTN  - follow on current regimen  # Chronic combined systolic and diastolic CHF  - noted on outpatient diuretics - on hold  - s/p gentle hydration  # Hypokalemia  - repleted  # hypophos  - is on regular diet  - being repleted x 1 now  Would continue inpatient monitoring   Eric Desanctis, MD 11/27/2019 2:14 PM

## 2019-11-27 NOTE — Plan of Care (Signed)
  Problem: Education: Goal: Knowledge of General Education information will improve Description: Including pain rating scale, medication(s)/side effects and non-pharmacologic comfort measures Outcome: Progressing   Problem: Health Behavior/Discharge Planning: Goal: Ability to manage health-related needs will improve Outcome: Progressing   Problem: Clinical Measurements: Goal: Ability to maintain clinical measurements within normal limits will improve Outcome: Progressing Goal: Will remain free from infection Outcome: Progressing Goal: Respiratory complications will improve Outcome: Progressing Goal: Cardiovascular complication will be avoided Outcome: Progressing   Problem: Activity: Goal: Risk for activity intolerance will decrease Outcome: Progressing   Problem: Coping: Goal: Level of anxiety will decrease Outcome: Progressing   Problem: Safety: Goal: Ability to remain free from injury will improve Outcome: Progressing   Problem: Skin Integrity: Goal: Risk for impaired skin integrity will decrease Outcome: Progressing

## 2019-11-27 NOTE — Progress Notes (Signed)
PROGRESS NOTE    [name]   HAL:[id number]  DOB: 08-Sep-1922  DOA: 11/24/2019     3  PCP: [name], MD  CC: fatigue, hyponatremia  Hospital Course: [name] is a 86 yo CM with PMH hard of hearing, obesity, macular degeneration, hypothyroidism, hyponatremia, hypertension, gout, GERD, CHF, BPH, A. fib, heart block s/p pacer who presented to the hospital with complaints of fatigue. Symptoms had been persistent for approximately 1 week prior to hospitalization. He has a caregiver at home and also recently was evaluated by his PCP prior to admission and found to be hyponatremic. Outpatient remedies were unsuccessful and repeat sodium level had not improved and he was referred to the ER for further evaluation.  On work-up in the ER his sodium was 115. He was started on normal saline and BMPs were trended. Nephrology was also consulted while hospitalized.   Interval History:  No events overnight. Caretaker bedside this morning. Had many questions from family; all were answered and addressed.   Old records reviewed in assessment of this patient  ROS: Constitutional: negative for chills and fevers, Respiratory: negative for cough, Cardiovascular: negative for chest pain and Gastrointestinal: negative for abdominal pain  Assessment & Plan: * Hyponatremia - recurrent problem in past as well (historically on HCTZ per note review), now per med rec is on lasix PO 80 mg BID and metolazone 2.5 mg on Sun/Wed; possible that he is overdiuresed as noted with dilute urine in setting of diuretics - serum osmo 274 and urine osmo 311 -Continue holding diuretics.  IV fluids discontinued per nephrology -Tolvaptan ordered x1 per nephrology on 9/29 - nephrology following, appreciate assistance - Na plateau still at 123 but urine output appropriate and patient asymptomatic  Chronic systolic CHF (congestive heart failure) ([place]) -Given large amount of Lasix dose at home, will repeat echo to  reevaluate EF - EF 45-50%, indeterminate diastology grade - would expect to be able to reduce home lasix dose at discharge  Acute renal failure superimposed on stage 3b chronic kidney disease (HCC) -Baseline creatinine around 2 -Creatinine is 2.55 on admission and improved with fluids -Continue trending BMP -Patient plans to establish outpatient with nephrology at discharge  Atrial fibrillation ([place]) -Continue bisoprolol - resume eliquis; monitor hematuria and Hgb (mild decrease but also received IVF)  BPH (benign prostatic hyperplasia) -Continue finasteride  Hypothyroidism -Continue Synthroid  Essential hypertension -Continue bisoprolol, hydralazine, Imdur -Continue holding irbesartan  Normocytic anemia -No evidence of bleeding -Follow-up iron studies: normal   Hypokalemia -Replete and recheck as needed   Antimicrobials: None  DVT prophylaxis: Eliquis Code Status: DNR Family Communication: Daughter bedside Disposition Plan: Status is: Inpatient  Remains inpatient appropriate because:IV treatments appropriate due to intensity of illness or inability to take PO and Inpatient level of care appropriate due to severity of illness   Dispo: The patient is from: Home              Anticipated d/c is to: Home              Anticipated d/c date is: 1 day              Patient currently is not medically stable to d/c.  Objective: Blood pressure (!) 119/53, pulse 65, temperature 97.6 F (36.4 C), temperature source Oral, resp. rate 20, height 6' (1.829 m), weight 101.3 kg, SpO2 96 %.  Examination: General appearance: Pleasant elderly man resting in bed with daughter bedside and he is in no distress.  Head:  Normocephalic, without obvious abnormality, atraumatic Eyes: EOMI Lungs: clear to auscultation bilaterally Heart: regular rate and rhythm and S1, S2 normal Abdomen: normal findings: bowel sounds normal and soft, non-tender Extremities: Trace lower extremity edema Skin:  mobility and turgor normal Neurologic: Grossly normal  Consultants:   Nephrology  Procedures:   None  Data Reviewed: I have personally reviewed following labs and imaging studies Results for orders placed or performed during the hospital encounter of 11/24/19 (from the past 24 hour(s))  Sodium     Status: Abnormal   Collection Time: 11/26/19  4:37 PM  Result Value Ref Range   Sodium 124 (L) 135 - 145 mmol/L  Sodium     Status: Abnormal   Collection Time: 11/26/19  9:33 PM  Result Value Ref Range   Sodium 122 (L) 135 - 145 mmol/L  Sodium     Status: Abnormal   Collection Time: 11/27/19  4:38 AM  Result Value Ref Range   Sodium 123 (L) 135 - 145 mmol/L  Magnesium     Status: Abnormal   Collection Time: 11/27/19  4:38 AM  Result Value Ref Range   Magnesium 2.7 (H) 1.7 - 2.4 mg/dL  Renal function panel     Status: Abnormal   Collection Time: 11/27/19  4:38 AM  Result Value Ref Range   Sodium 123 (L) 135 - 145 mmol/L   Potassium 3.7 3.5 - 5.1 mmol/L   Chloride 86 (L) 98 - 111 mmol/L   CO2 26 22 - 32 mmol/L   Glucose, Bld 147 (H) 70 - 99 mg/dL   BUN 65 (H) 8 - 23 mg/dL   Creatinine, Ser 1.89 (H) 0.61 - 1.24 mg/dL   Calcium 8.8 (L) 8.9 - 10.3 mg/dL   Phosphorus 1.9 (L) 2.5 - 4.6 mg/dL   Albumin 3.4 (L) 3.5 - 5.0 g/dL   GFR calc non Af Amer 30 (L) >60 mL/min   GFR calc Af Amer 34 (L) >60 mL/min   Anion gap 11 5 - 15  CBC with Differential/Platelet     Status: Abnormal   Collection Time: 11/27/19  4:38 AM  Result Value Ref Range   WBC 14.7 (H) 4.0 - 10.5 K/uL   RBC 3.13 (L) 4.22 - 5.81 MIL/uL   Hemoglobin 9.9 (L) 13.0 - 17.0 g/dL   HCT 28.4 (L) 39 - 52 %   MCV 90.7 80.0 - 100.0 fL   MCH 31.6 26.0 - 34.0 pg   MCHC 34.9 30.0 - 36.0 g/dL   RDW 14.0 11.5 - 15.5 %   Platelets 175 150 - 400 K/uL   nRBC 0.0 0.0 - 0.2 %   Neutrophils Relative % 80 %   Neutro Abs 11.8 (H) 1.7 - 7.7 K/uL   Lymphocytes Relative 11 %   Lymphs Abs 1.5 0.7 - 4.0 K/uL   Monocytes Relative 8 %    Monocytes Absolute 1.2 (H) 0 - 1 K/uL   Eosinophils Relative 0 %   Eosinophils Absolute 0.0 0 - 0 K/uL   Basophils Relative 0 %   Basophils Absolute 0.0 0 - 0 K/uL   Immature Granulocytes 1 %   Abs Immature Granulocytes 0.08 (H) 0.00 - 0.07 K/uL  Sodium     Status: Abnormal   Collection Time: 11/27/19  9:47 AM  Result Value Ref Range   Sodium 125 (L) 135 - 145 mmol/L    Recent Results (from the past 240 hour(s))  Respiratory Panel by RT PCR (Flu A&B, Covid) - Nasopharyngeal Swab  Status: None   Collection Time: 11/24/19 12:16 PM   Specimen: Nasopharyngeal Swab  Result Value Ref Range Status   SARS Coronavirus 2 by RT PCR NEGATIVE NEGATIVE Final    Comment: (NOTE) SARS-CoV-2 target nucleic acids are NOT DETECTED.  The SARS-CoV-2 RNA is generally detectable in upper respiratoy specimens during the acute phase of infection. The lowest concentration of SARS-CoV-2 viral copies this assay can detect is 131 copies/mL. A negative result does not preclude SARS-Cov-2 infection and should not be used as the sole basis for treatment or other patient management decisions. A negative result may occur with  improper specimen collection/handling, submission of specimen other than nasopharyngeal swab, presence of viral mutation(s) within the areas targeted by this assay, and inadequate number of viral copies (<131 copies/mL). A negative result must be combined with clinical observations, patient history, and epidemiological information. The expected result is Negative.  Fact Sheet for Patients:  PinkCheek.be  Fact Sheet for Healthcare Providers:  GravelBags.it  This test is no t yet approved or cleared by the [place] FDA and  has been authorized for detection and/or diagnosis of SARS-CoV-2 by FDA under an Emergency Use Authorization (EUA). This EUA will remain  in effect (meaning this test can be used) for the duration of  the COVID-19 declaration under Section 564(b)(1) of the Act, 21 U.S.C. section 360bbb-3(b)(1), unless the authorization is terminated or revoked sooner.     Influenza A by PCR NEGATIVE NEGATIVE Final   Influenza B by PCR NEGATIVE NEGATIVE Final    Comment: (NOTE) The Xpert Xpress SARS-CoV-2/FLU/RSV assay is intended as an aid in  the diagnosis of influenza from Nasopharyngeal swab specimens and  should not be used as a sole basis for treatment. Nasal washings and  aspirates are unacceptable for Xpert Xpress SARS-CoV-2/FLU/RSV  testing.  Fact Sheet for Patients: PinkCheek.be  Fact Sheet for Healthcare Providers: GravelBags.it  This test is not yet approved or cleared by the [place] FDA and  has been authorized for detection and/or diagnosis of SARS-CoV-2 by  FDA under an Emergency Use Authorization (EUA). This EUA will remain  in effect (meaning this test can be used) for the duration of the  Covid-19 declaration under Section 564(b)(1) of the Act, 21  U.S.C. section 360bbb-3(b)(1), unless the authorization is  terminated or revoked. Performed at [facility] [place] [address] [place], [place] [address]      Radiology Studies: ECHOCARDIOGRAM COMPLETE  Result Date: 11/26/2019    ECHOCARDIOGRAM REPORT   Patient Name:   QUINTYN DOMBEK [facility] Date of Exam: 11/26/2019 Medical Rec #:  [id number]         Height:       72.0 in Accession #:    [id number]        Weight:       223.3 lb Date of Birth:  [birth year]          BSA:          2.233 m Patient Age:    89 years          BP:           148/63 mmHg Patient Gender: M                 HR:           64 bpm. Exam Location:  Inpatient Procedure: 2D Echo Indications:    dyspnea  History:        Patient has no prior history of  Echocardiogram examinations.                 Pacemaker; Risk Factors:Former Smoker and Hypertension. Hx of                 ablation.  Sonographer:     Jannett Celestine RDCS (AE) Referring Phys: [place]  Sonographer Comments: Restricted mobility. off axis apical windows. unable to reposition patient. exam performed supine IMPRESSIONS  1. Left ventricular ejection fraction, by estimation, is 45 to 50%. The left ventricle has mildly decreased function. The left ventricle has no regional wall motion abnormalities. There is mild left ventricular hypertrophy. Left ventricular diastolic parameters are indeterminate.  2. Right ventricular systolic function is normal. The right ventricular size is mildly enlarged. There is moderately elevated pulmonary artery systolic pressure. The estimated right ventricular systolic pressure is 62.6 mmHg.  3. The mitral valve is degenerative. Mild mitral valve regurgitation. No evidence of mitral stenosis. Moderate mitral annular calcification.  4. The aortic valve is normal in structure. Aortic valve regurgitation is mild. No aortic stenosis is present.  5. Aortic dilatation noted. There is borderline dilatation at the level of the sinuses of Valsalva, measuring 38 mm.  6. The inferior vena cava is normal in size with greater than 50% respiratory variability, suggesting right atrial pressure of 3 mmHg. FINDINGS  Left Ventricle: Left ventricular ejection fraction, by estimation, is 45 to 50%. The left ventricle has mildly decreased function. The left ventricle has no regional wall motion abnormalities. The left ventricular internal cavity size was normal in size. There is mild left ventricular hypertrophy. Abnormal (paradoxical) septal motion, consistent with RV pacemaker. Left ventricular diastolic parameters are indeterminate.  LV Wall Scoring: The entire apex is akinetic. The mid inferoseptal segment is hypokinetic. The basal anteroseptal segment, basal inferolateral segment, basal anterolateral segment, basal anterior segment, basal inferior segment, and basal inferoseptal segment are normal. Right Ventricle: The right  ventricular size is mildly enlarged. No increase in right ventricular wall thickness. Right ventricular systolic function is normal. There is moderately elevated pulmonary artery systolic pressure. The tricuspid regurgitant velocity is 3.66 m/s, and with an assumed right atrial pressure of 3 mmHg, the estimated right ventricular systolic pressure is 94.8 mmHg. Left Atrium: Left atrial size was normal in size. Right Atrium: Right atrial size was normal in size. Pericardium: There is no evidence of pericardial effusion. Mitral Valve: The mitral valve is degenerative in appearance. There is mild thickening of the mitral valve leaflet(s). There is mild calcification of the mitral valve leaflet(s). Moderate mitral annular calcification. Mild mitral valve regurgitation. No evidence of mitral valve stenosis. Tricuspid Valve: The tricuspid valve is normal in structure. Tricuspid valve regurgitation is trivial. No evidence of tricuspid stenosis. Aortic Valve: The aortic valve is normal in structure. Aortic valve regurgitation is mild. No aortic stenosis is present. Pulmonic Valve: The pulmonic valve was normal in structure. Pulmonic valve regurgitation is trivial. No evidence of pulmonic stenosis. Aorta: Aortic dilatation noted. There is borderline dilatation at the level of the sinuses of Valsalva, measuring 38 mm. Venous: The inferior vena cava is normal in size with greater than 50% respiratory variability, suggesting right atrial pressure of 3 mmHg. IAS/Shunts: No atrial level shunt detected by color flow Doppler. Additional Comments: A pacer wire is visualized.  LEFT VENTRICLE PLAX 2D LVIDd:         4.50 cm LVIDs:         3.10 cm LV PW:  1.30 cm LV IVS:        1.40 cm LVOT diam:     2.30 cm LV SV:         54 LV SV Index:   24 LVOT Area:     4.15 cm  RIGHT VENTRICLE RV S prime:     8.70 cm/s TAPSE (M-mode): 1.5 cm LEFT ATRIUM           Index       RIGHT ATRIUM           Index LA diam:      3.60 cm 1.61 cm/m  RA  Area:     19.90 cm LA Vol (A4C): 39.5 ml 17.69 ml/m RA Volume:   49.50 ml  22.17 ml/m  AORTIC VALVE LVOT Vmax:   59.40 cm/s LVOT Vmean:  37.900 cm/s LVOT VTI:    0.130 m  AORTA Ao Root diam: 3.80 cm MITRAL VALVE               TRICUSPID VALVE MV Area (PHT): 2.32 cm    TR Peak grad:   53.6 mmHg MV Decel Time: 327 msec    TR Vmax:        366.00 cm/s MV E velocity: 79.70 cm/s                            SHUNTS                            Systemic VTI:  0.13 m                            Systemic Diam: 2.30 cm Candee Furbish MD Electronically signed by Candee Furbish MD Signature Date/Time: 11/26/2019/11:31:27 AM    Final    DG CHEST PORT 1 VIEW  Final Result      Scheduled Meds: . allopurinol  100 mg Oral Daily  . apixaban  2.5 mg Oral BID  . bisoprolol  10 mg Oral BID  . Chlorhexidine Gluconate Cloth  6 each Topical Daily  . finasteride  5 mg Oral Daily  . hydrALAZINE  50 mg Oral BID  . isosorbide mononitrate  60 mg Oral Daily  . levothyroxine  112 mcg Oral Q0600  . mometasone-formoterol  2 puff Inhalation BID  . sodium chloride  3 mL Nebulization BID  . temazepam  15 mg Oral QHS   PRN Meds: acetaminophen **OR** acetaminophen, ondansetron **OR** ondansetron (ZOFRAN) IV, prochlorperazine, senna-docusate Continuous Infusions: . sodium phosphate  Dextrose 5% IVPB 30 mmol (11/27/19 1000)      LOS: 3 days  Time spent: Greater than 50% of the 35 minute visit was spent in counseling/coordination of care for the patient as laid out in the A&P.   Dwyane Dee, MD [facility] 11/27/2019, 1:49 PM  Contact via secure chat.  To contact the attending provider between 7A-7P or the covering provider during after hours 7P-7A, please log into the web site [email or web address] and access using universal [place] password for that web site. If you do not have the password, please call the hospital operator.

## 2019-11-27 NOTE — Progress Notes (Signed)
Awaken and alert 2200 meds given at this time. Repositioning in bed oral and peri/foley care given. Will cont to assess.

## 2019-11-27 NOTE — Progress Notes (Signed)
Physical Therapy Treatment Patient Details Name: [name] MRN: [id number] DOB: 12/29/1922 Today's Date: 11/27/2019    History of Present Illness 86 yo male admitted with hyponatremia. Hx of Sz, CHF, CKD, A fib, macular degeneration, pacemaker, obesity, gout    PT Comments    Patient progressing gradually with acute PT services. Patient able to advance gait distance to ~35' today, continues to require min assist to steady and manage RW safely. Pt required cues for sequencing bed mobility and transfers including safe reach back to sit in recliner. He will continue to benefit from skilled PT, recommend HHPT follow up as pt has 24/7 care available. Acute will progress as able.   Follow Up Recommendations  Home health PT;Supervision/Assistance - 24 hour     Equipment Recommendations  None recommended by PT    Recommendations for Other Services       Precautions / Restrictions Precautions Precautions: Fall Restrictions Weight Bearing Restrictions: No    Mobility  Bed Mobility Overal bed mobility: Needs Assistance Bed Mobility: Supine to Sit              Transfers Overall transfer level: Needs assistance Equipment used: Rolling walker (2 wheeled) Transfers: Sit to/from Stand Sit to Stand: Min assist         General transfer comment: min assist for power up and to steady in standing. cues for safe reach back to control eccentric decent to chair.  Ambulation/Gait Ambulation/Gait assistance: Min assist Gait Distance (Feet): 35 Feet Assistive device: Rolling walker (2 wheeled) Gait Pattern/deviations: Decreased step length - right;Decreased step length - left;Decreased stride length;Trunk flexed Gait velocity: decr   General Gait Details: assist for walker management and cues to improve proximity to RW. assist to steady throughout. close chair follow for safety.    Stairs             Wheelchair Mobility    Modified Rankin (Stroke Patients Only)        Balance Overall balance assessment: Needs assistance Sitting-balance support: Feet supported Sitting balance-Leahy Scale: Fair     Standing balance support: During functional activity;Bilateral upper extremity supported Standing balance-Leahy Scale: Poor                              Cognition Arousal/Alertness: Awake/alert Behavior During Therapy: WFL for tasks assessed/performed                                   General Comments: appears [facility]      Exercises Total Joint Exercises Ankle Circles/Pumps: AROM;Both;15 reps;Seated    General Comments        Pertinent Vitals/Pain Pain Assessment: No/denies pain    Home Living                      Prior Function            PT Goals (current goals can now be found in the care plan section) Acute Rehab PT Goals Patient Stated Goal: to get strength back. to return home. PT Goal Formulation: With patient/family Time For Goal Achievement: 12/10/19 Potential to Achieve Goals: Good Progress towards PT goals: Progressing toward goals    Frequency    Min 3X/week      PT Plan Current plan remains appropriate    Co-evaluation  AM-PAC PT "6 Clicks" Mobility   Outcome Measure  Help needed turning from your back to your side while in a flat bed without using bedrails?: A Little Help needed moving from lying on your back to sitting on the side of a flat bed without using bedrails?: A Little Help needed moving to and from a bed to a chair (including a wheelchair)?: A Little Help needed standing up from a chair using your arms (e.g., wheelchair or bedside chair)?: A Little Help needed to walk in hospital room?: A Little Help needed climbing 3-5 steps with a railing? : A Lot 6 Click Score: 17    End of Session Equipment Utilized During Treatment: Gait belt Activity Tolerance: Patient tolerated treatment well Patient left: in chair;with call bell/phone within  reach;with chair alarm set Nurse Communication: Mobility status PT Visit Diagnosis: Muscle weakness (generalized) (M62.81);Difficulty in walking, not elsewhere classified (R26.2)     Time: 6294-7654 PT Time Calculation (min) (ACUTE ONLY): 21 min  Charges:  $Gait Training: 8-22 mins                     Verner Mould, DPT Acute Rehabilitation Services  Office [phone] Pager [phone]  11/27/2019 4:37 PM

## 2019-11-27 NOTE — TOC Progression Note (Signed)
Transition of Care [facility]) - Progression Note    Patient Details  Name: [name] MRN: [id number] Date of Birth: 1922/12/10  Transition of Care [facility]) CM/SW Contact  Breiana Stratmann, Juliann Pulse, RN Phone Number: 11/27/2019, 2:31 PM  Clinical Narrative: PT recc HHPT-left vm w/dtr Beth [phone]-await call back for [place] services,choice.      Expected Discharge Plan: [place] Barriers to Discharge: Continued Medical Work up  Expected Discharge Plan and Services Expected Discharge Plan: [place]   Discharge Planning Services: CM Consult   Living arrangements for the past 2 months: Single Family Home                                       Social Determinants of Health (SDOH) Interventions    Readmission Risk Interventions No flowsheet data found.

## 2019-11-28 LAB — TSH: TSH: 8.286 u[IU]/mL — ABNORMAL HIGH (ref 0.350–4.500)

## 2019-11-28 LAB — BASIC METABOLIC PANEL
Anion gap: 12 (ref 5–15)
Anion gap: 12 (ref 5–15)
Anion gap: 13 (ref 5–15)
BUN: 71 mg/dL — ABNORMAL HIGH (ref 8–23)
BUN: 72 mg/dL — ABNORMAL HIGH (ref 8–23)
BUN: 79 mg/dL — ABNORMAL HIGH (ref 8–23)
CO2: 27 mmol/L (ref 22–32)
CO2: 27 mmol/L (ref 22–32)
CO2: 28 mmol/L (ref 22–32)
Calcium: 8.6 mg/dL — ABNORMAL LOW (ref 8.9–10.3)
Calcium: 8.4 mg/dL — ABNORMAL LOW (ref 8.9–10.3)
Calcium: 8.7 mg/dL — ABNORMAL LOW (ref 8.9–10.3)
Chloride: 87 mmol/L — ABNORMAL LOW (ref 98–111)
Chloride: 88 mmol/L — ABNORMAL LOW (ref 98–111)
Chloride: 88 mmol/L — ABNORMAL LOW (ref 98–111)
Creatinine, Ser: 1.99 mg/dL — ABNORMAL HIGH (ref 0.61–1.24)
Creatinine, Ser: 1.96 mg/dL — ABNORMAL HIGH (ref 0.61–1.24)
Creatinine, Ser: 1.86 mg/dL — ABNORMAL HIGH (ref 0.61–1.24)
GFR calc Af Amer: 32 mL/min — ABNORMAL LOW (ref >60)
GFR calc Af Amer: 33 mL/min — ABNORMAL LOW (ref >60)
GFR calc Af Amer: 35 mL/min — ABNORMAL LOW (ref >60)
GFR calc non Af Amer: 28 mL/min — ABNORMAL LOW (ref >60)
GFR calc non Af Amer: 28 mL/min — ABNORMAL LOW (ref >60)
GFR calc non Af Amer: 30 mL/min — ABNORMAL LOW (ref >60)
Glucose, Bld: 134 mg/dL — ABNORMAL HIGH (ref 70–99)
Glucose, Bld: 203 mg/dL — ABNORMAL HIGH (ref 70–99)
Glucose, Bld: 162 mg/dL — ABNORMAL HIGH (ref 70–99)
Potassium: 3.6 mmol/L (ref 3.5–5.1)
Potassium: 3.4 mmol/L — ABNORMAL LOW (ref 3.5–5.1)
Potassium: 3.7 mmol/L (ref 3.5–5.1)
Sodium: 126 mmol/L — ABNORMAL LOW (ref 135–145)
Sodium: 127 mmol/L — ABNORMAL LOW (ref 135–145)
Sodium: 129 mmol/L — ABNORMAL LOW (ref 135–145)

## 2019-11-28 LAB — CBC WITH DIFFERENTIAL/PLATELET
Abs Immature Granulocytes: 0.04 10*3/uL (ref 0.00–0.07)
Basophils Absolute: 0.1 10*3/uL (ref 0.0–0.1)
Basophils Relative: 1 %
Eosinophils Absolute: 0.1 10*3/uL (ref 0.0–0.5)
Eosinophils Relative: 1 %
HCT: 28.7 % — ABNORMAL LOW (ref 39.0–52.0)
Hemoglobin: 10 g/dL — ABNORMAL LOW (ref 13.0–17.0)
Immature Granulocytes: 0 %
Lymphocytes Relative: 13 %
Lymphs Abs: 1.7 10*3/uL (ref 0.7–4.0)
MCH: 32.2 pg (ref 26.0–34.0)
MCHC: 34.8 g/dL (ref 30.0–36.0)
MCV: 92.3 fL (ref 80.0–100.0)
Monocytes Absolute: 1.2 10*3/uL — ABNORMAL HIGH (ref 0.1–1.0)
Monocytes Relative: 10 %
Neutro Abs: 9.7 10*3/uL — ABNORMAL HIGH (ref 1.7–7.7)
Neutrophils Relative %: 75 %
Platelets: 176 10*3/uL (ref 150–400)
RBC: 3.11 MIL/uL — ABNORMAL LOW (ref 4.22–5.81)
RDW: 14.1 % (ref 11.5–15.5)
WBC: 12.8 10*3/uL — ABNORMAL HIGH (ref 4.0–10.5)
nRBC: 0 % (ref 0.0–0.2)

## 2019-11-28 LAB — PHOSPHORUS: Phosphorus: 3.2 mg/dL (ref 2.5–4.6)

## 2019-11-28 LAB — MAGNESIUM: Magnesium: 2.5 mg/dL — ABNORMAL HIGH (ref 1.7–2.4)

## 2019-11-28 MED ORDER — SODIUM CHLORIDE 0.9 % IV BOLUS
500.0000 mL | Freq: Once | INTRAVENOUS | Status: AC
  Administered 2019-11-28: 500 mL via INTRAVENOUS

## 2019-11-28 MED ORDER — FUROSEMIDE 10 MG/ML IJ SOLN
80.0000 mg | Freq: Once | INTRAMUSCULAR | Status: AC
  Administered 2019-11-28: 80 mg via INTRAVENOUS
  Filled 2019-11-28: qty 8

## 2019-11-28 MED ORDER — POTASSIUM CHLORIDE CRYS ER 20 MEQ PO TBCR
20.0000 meq | EXTENDED_RELEASE_TABLET | Freq: Once | ORAL | Status: AC
  Administered 2019-11-28: 20 meq via ORAL
  Filled 2019-11-28: qty 1

## 2019-11-28 MED ORDER — ALUM & MAG HYDROXIDE-SIMETH 200-200-20 MG/5ML PO SUSP
30.0000 mL | ORAL | Status: DC | PRN
  Administered 2019-11-28 – 2019-11-29 (×2): 30 mL via ORAL
  Filled 2019-11-28 (×2): qty 30

## 2019-11-28 MED ORDER — TOLVAPTAN 15 MG PO TABS
15.0000 mg | ORAL_TABLET | Freq: Once | ORAL | Status: AC
  Administered 2019-11-28: 15 mg via ORAL
  Filled 2019-11-28: qty 1

## 2019-11-28 MED ORDER — TEMAZEPAM 15 MG PO CAPS
15.0000 mg | ORAL_CAPSULE | Freq: Every day | ORAL | Status: DC
  Administered 2019-11-28 – 2019-11-29 (×2): 15 mg via ORAL
  Filled 2019-11-28 (×2): qty 1

## 2019-11-28 NOTE — TOC Progression Note (Addendum)
Transition of Care [facility]) - Progression Note    Patient Details  Name: [name] MRN: [id number] Date of Birth: [birth year]  [facility] Care [facility]) CM/SW Contact  Cecil Cobbs Phone Number: 11/28/2019, 5:23 PM  Clinical Narrative:     CSW received phone call from patient's caregiver Elenore Rota, [phone].  She was asking about what home health agency will be following him once he discharges.  CSW asked if she had a preference, she did not.  CSW was also asked if a hospital bed with gel overlay or an air mattress can be ordered due to patient having difficulty breathing, CSW told her if the physician orders it then it can be and it usually takes about 24 hours to be delivered.  Patient's caregiver stated they will discuss over the weekend if they would like one or not.  She said patient has a walker and a bedside commode already, and does not need any other equipment. CSW to continue to follow patient's progress throughout discharge planning.   Expected Discharge Plan: [place] Barriers to Discharge: Continued Medical Work up  Expected Discharge Plan and Services Expected Discharge Plan: [place]   Discharge Planning Services: CM Consult   Living arrangements for the past 2 months: Single Family Home                                       Social Determinants of Health (SDOH) Interventions    Readmission Risk Interventions No flowsheet data found.

## 2019-11-28 NOTE — Progress Notes (Signed)
First void after foley disconted. 252ml clood tinted urine no clotts.

## 2019-11-28 NOTE — Progress Notes (Signed)
Pt became hypotensive with a manual BP of 82/42. Pt lethargic and falls asleep in between words. MD notified. New orders placed.   >>500cc bolus given. Pt BP responded well to fluid and mental status returned back to baseline.   Will continue to monitor.

## 2019-11-28 NOTE — Progress Notes (Signed)
[facility] Progress Note  Name: [name] MRN: [id number] DOB: [birth year]  Chief Complaint:  Low sodium at PCP   Subjective:  Foley was d/c'd on 9/30.  Had 850 mL uop charted.  Got lasix 60 mg IV once on 9/30.  Daily weights not available.  Dog is at home and coming later this afternoon.  He is hoping to see his son and his caregiver has talked with nursing staff to see if possible.  Per his caregiver still some shortness of breath.  They've been "playing cards" this AM but there aren't really any rules to the game.  He was confused at home the week before admission and his confusion is worse today than yesterday   Review of systems:    He states shortness of breath is better; no chest pain  Denies vomiting; nausea is better  ------------ Background on consult:  [name] is a 86 y.o. male with a history of BPH, CKD, chronic combined systolic and diastolic CHF, and HTN who presented to the hospital after abnormal labs at his physician's office.  He was found to have a sodium of 112 on labs with PCP per report.  He received 1 L normal saline bolus in the ER.  Sodium went from 115 prebolus to 118 after the bolus.  He is on Lasix and metolazone as below.  Note that he has a history of prior hyponatremia per charting.  Baseline Cr near 2 recently.  He had the labs today at his PCP's office.  His daughter is at bedside and serves as Risk manager.  He has had some nausea and has gotten a PRN ordered.  His daughter states that she has noticed some confusion.  The patient states that he was taken off of his Lasix on Friday 9/24 as he was not eating well.  He has been trying to drink lots of Gatorade.  He initially tells me he has never had a suprapubic catheter and then recalls that yes at one point he did.     Intake/Output Summary (Last 24 hours) at 11/28/2019 1055 Last data filed at 11/28/2019 0600 Gross per 24 hour  Intake 740.01 ml  Output 850 ml  Net -109.99 ml     Vitals:  Vitals:   11/27/19 2016 11/27/19 2058 11/28/19 0510 11/28/19 0849  BP:  (!) 142/53 (!) 140/49   Pulse:  (!) 59 64   Resp:  (!) 24 (!) 24   Temp:  97.9 F (36.6 C) 98.2 F (36.8 C)   TempSrc:  Oral Oral   SpO2: 95% 98% 94% 94%  Weight:      Height:         Physical Exam:  General: elderly male in bed in NAD HEENT: NCAT Eyes: left eyelid a little swollen Neck: no JVD supple trachea midline Heart: S1S2 no rub Lungs: crackles as well as transmitted upper airway sounds  Abdomen: soft nontender obese habitus no suprapubic Extremities: no pitting edema appreciated Neuro:sleepy but conversant; oriented to person and states we are at someone's house and not sure of year.  hard of hearing provides hx  Psych normal mood and affect GU foley is out clearing hematuria   Medications reviewed   Labs:  BMP Latest Ref Rng & Units 11/28/2019 11/27/2019 11/27/2019  Glucose 70 - 99 mg/dL 134(H) - 147(H)  BUN 8 - 23 mg/dL 71(H) - 65(H)  Creatinine 0.61 - 1.24 mg/dL 1.99(H) - 1.89(H)  BUN/Creat Ratio 10 - 24 - - -  Sodium 135 - 145 mmol/L 126(L) 125(L) 123(L)  Potassium 3.5 - 5.1 mmol/L 3.6 - 3.7  Chloride 98 - 111 mmol/L 87(L) - 86(L)  CO2 22 - 32 mmol/L 27 - 26  Calcium 8.9 - 10.3 mg/dL 8.6(L) - 8.8(L)     Assessment/Plan:   #Hyponatremia - Improving on presentation to ER and s/p 1 L bolus normal saline and NS at 50 /hr; NS was stopped when Na reached 122 per order.  tolvaptan 7.5 mg once on 9/29.  Got lasix 60 mg IV once on 9/30 - lasix 80 mg IV once and tolvaptan 15 mg once   - check TSH - noted BMP is ordered every 8 hours  # Urinary retention - hx BPH on finasteride - foley is out - monitor for retention  # CKD stage 4 - baseline Cr is 2    - they request follow-up in office with [facility] on discharge    #HTN  - follow on current regimen  # AMS - hyponatremia is improving - may have component of delirium   # Chronic combined systolic and  diastolic CHF  - noted on outpatient diuretic regimen of 80 mg BID and metolazone twice a week (had been very swollen 6-8 weeks ago per caregiver) - s/p gentle hydration and now s/p lasix once  - strict ins/outs and daily weights  # Hypokalemia  - repleted  # hypophos  - is on regular diet; s/p iv repletion; recheck not available - add on phos   Would continue inpatient monitoring   Claudia Desanctis, MD 11/28/2019 11:18 AM

## 2019-11-28 NOTE — Progress Notes (Signed)
PROGRESS NOTE    [name]   TTS:[id number]  DOB: 04-05-1922  DOA: 11/24/2019     4  PCP: [name], MD  CC: fatigue, hyponatremia  Hospital Course: Mr. [name] is a 86 yo CM with PMH hard of hearing, obesity, macular degeneration, hypothyroidism, hyponatremia, hypertension, gout, GERD, CHF, BPH, A. fib, heart block s/p pacer who presented to the hospital with complaints of fatigue. Symptoms had been persistent for approximately 1 week prior to hospitalization. He has a caregiver at home and also recently was evaluated by his PCP prior to admission and found to be hyponatremic. Outpatient remedies were unsuccessful and repeat sodium level had not improved and he was referred to the ER for further evaluation.  On work-up in the ER his sodium was 115. He was started on normal saline and BMPs were trended. Nephrology was also consulted while hospitalized.   Interval History:  No events overnight. Caretaker bedside again this morning.  Told an amazing story to me this morning about how he came to establish [name].  He asked for his temazepam to be given at 7pm as well.   Old records reviewed in assessment of this patient  ROS: Constitutional: negative for chills and fevers, Respiratory: negative for cough, Cardiovascular: negative for chest pain and Gastrointestinal: negative for abdominal pain  Assessment & Plan: * Hyponatremia - recurrent problem in past as well (historically on HCTZ per note review), now per med rec is on lasix PO 80 mg BID and metolazone 2.5 mg on Sun/Wed; possible that he is overdiuresed as noted with dilute urine in setting of diuretics - serum osmo 274 and urine osmo 311 -Tolvaptan ordered x1 per nephrology on 9/29. Repeat dose again on 10/1 - repeat dose of lasix also ordered per nephrology on 10/1, will follow up responses - nephrology following, greatly appreciate assistance - mild improvement/stability of BMP, will continue BMP on q8h  checks - continue I&Os  Chronic systolic CHF (congestive heart failure) (HCC) -Given large amount of Lasix dose at home, will repeat echo to reevaluate EF - EF 45-50%, indeterminate diastology grade - would expect to be able to reduce home lasix dose at discharge  Acute renal failure superimposed on stage 3b chronic kidney disease (HCC) -Baseline creatinine around 2 -Creatinine is 2.55 on admission and improved with fluids -Continue trending BMP -Patient plans to establish outpatient with nephrology at discharge  Atrial fibrillation ([place]) -Continue bisoprolol - resume eliquis; monitor hematuria and Hgb (mild decrease but also received IVF)  BPH (benign prostatic hyperplasia) -Continue finasteride  Hypothyroidism -Continue Synthroid  Essential hypertension -Continue bisoprolol, hydralazine, Imdur -Continue holding irbesartan  Normocytic anemia -No evidence of bleeding -Follow-up iron studies: normal   Hypokalemia -Replete and recheck as needed   Antimicrobials: None  DVT prophylaxis: Eliquis Code Status: DNR Family Communication: Daughter bedside Disposition Plan: Status is: Inpatient  Remains inpatient appropriate because:IV treatments appropriate due to intensity of illness or inability to take PO and Inpatient level of care appropriate due to severity of illness   Dispo: The patient is from: Home              Anticipated d/c is to: Home              Anticipated d/c date is: 2 days              Patient currently is not medically stable to d/c.  Objective: Blood pressure (!) 140/49, pulse 64, temperature 98.2 F (36.8 C), temperature source Oral, resp.  rate (!) 24, height 6' (1.829 m), weight 101.3 kg, SpO2 94 %.  Examination: General appearance: Pleasant elderly man resting in bed with daughter bedside and he is in no distress.  Head: Normocephalic, without obvious abnormality, atraumatic Eyes: EOMI Lungs: clear to auscultation bilaterally, minor crackles  in bases Heart: regular rate and rhythm and S1, S2 normal Abdomen: normal findings: bowel sounds normal and soft, non-tender Extremities: Trace lower extremity edema Skin: mobility and turgor normal Neurologic: Grossly normal  Consultants:   Nephrology  Procedures:   None  Data Reviewed: I have personally reviewed following labs and imaging studies Results for orders placed or performed during the hospital encounter of 11/24/19 (from the past 24 hour(s))  Magnesium     Status: Abnormal   Collection Time: 11/28/19  4:59 AM  Result Value Ref Range   Magnesium 2.5 (H) 1.7 - 2.4 mg/dL  CBC with Differential/Platelet     Status: Abnormal   Collection Time: 11/28/19  4:59 AM  Result Value Ref Range   WBC 12.8 (H) 4.0 - 10.5 K/uL   RBC 3.11 (L) 4.22 - 5.81 MIL/uL   Hemoglobin 10.0 (L) 13.0 - 17.0 g/dL   HCT 28.7 (L) 39 - 52 %   MCV 92.3 80.0 - 100.0 fL   MCH 32.2 26.0 - 34.0 pg   MCHC 34.8 30.0 - 36.0 g/dL   RDW 14.1 11.5 - 15.5 %   Platelets 176 150 - 400 K/uL   nRBC 0.0 0.0 - 0.2 %   Neutrophils Relative % 75 %   Neutro Abs 9.7 (H) 1.7 - 7.7 K/uL   Lymphocytes Relative 13 %   Lymphs Abs 1.7 0.7 - 4.0 K/uL   Monocytes Relative 10 %   Monocytes Absolute 1.2 (H) 0 - 1 K/uL   Eosinophils Relative 1 %   Eosinophils Absolute 0.1 0 - 0 K/uL   Basophils Relative 1 %   Basophils Absolute 0.1 0 - 0 K/uL   Immature Granulocytes 0 %   Abs Immature Granulocytes 0.04 0.00 - 0.07 K/uL  Basic metabolic panel     Status: Abnormal   Collection Time: 11/28/19  4:59 AM  Result Value Ref Range   Sodium 126 (L) 135 - 145 mmol/L   Potassium 3.6 3.5 - 5.1 mmol/L   Chloride 87 (L) 98 - 111 mmol/L   CO2 27 22 - 32 mmol/L   Glucose, Bld 134 (H) 70 - 99 mg/dL   BUN 71 (H) 8 - 23 mg/dL   Creatinine, Ser 1.99 (H) 0.61 - 1.24 mg/dL   Calcium 8.6 (L) 8.9 - 10.3 mg/dL   GFR calc non Af Amer 28 (L) >60 mL/min   GFR calc Af Amer 32 (L) >60 mL/min   Anion gap 12 5 - 15    Recent Results (from the  past 240 hour(s))  Respiratory Panel by RT PCR (Flu A&B, Covid) - Nasopharyngeal Swab     Status: None   Collection Time: 11/24/19 12:16 PM   Specimen: Nasopharyngeal Swab  Result Value Ref Range Status   SARS Coronavirus 2 by RT PCR NEGATIVE NEGATIVE Final    Comment: (NOTE) SARS-CoV-2 target nucleic acids are NOT DETECTED.  The SARS-CoV-2 RNA is generally detectable in upper respiratoy specimens during the acute phase of infection. The lowest concentration of SARS-CoV-2 viral copies this assay can detect is 131 copies/mL. A negative result does not preclude SARS-Cov-2 infection and should not be used as the sole basis for treatment or other patient management  decisions. A negative result may occur with  improper specimen collection/handling, submission of specimen other than nasopharyngeal swab, presence of viral mutation(s) within the areas targeted by this assay, and inadequate number of viral copies (<131 copies/mL). A negative result must be combined with clinical observations, patient history, and epidemiological information. The expected result is Negative.  Fact Sheet for Patients:  PinkCheek.be  Fact Sheet for Healthcare Providers:  GravelBags.it  This test is no t yet approved or cleared by the [place] FDA and  has been authorized for detection and/or diagnosis of SARS-CoV-2 by FDA under an Emergency Use Authorization (EUA). This EUA will remain  in effect (meaning this test can be used) for the duration of the COVID-19 declaration under Section 564(b)(1) of the Act, 21 U.S.C. section 360bbb-3(b)(1), unless the authorization is terminated or revoked sooner.     Influenza A by PCR NEGATIVE NEGATIVE Final   Influenza B by PCR NEGATIVE NEGATIVE Final    Comment: (NOTE) The Xpert Xpress SARS-CoV-2/FLU/RSV assay is intended as an aid in  the diagnosis of influenza from Nasopharyngeal swab specimens and    should not be used as a sole basis for treatment. Nasal washings and  aspirates are unacceptable for Xpert Xpress SARS-CoV-2/FLU/RSV  testing.  Fact Sheet for Patients: PinkCheek.be  Fact Sheet for Healthcare Providers: GravelBags.it  This test is not yet approved or cleared by the [place] FDA and  has been authorized for detection and/or diagnosis of SARS-CoV-2 by  FDA under an Emergency Use Authorization (EUA). This EUA will remain  in effect (meaning this test can be used) for the duration of the  Covid-19 declaration under Section 564(b)(1) of the Act, 21  U.S.C. section 360bbb-3(b)(1), unless the authorization is  terminated or revoked. Performed at [facility], [place] [address] [place], [place] [address]      Radiology Studies: No results found. DG CHEST PORT 1 VIEW  Final Result      Scheduled Meds: . allopurinol  100 mg Oral Daily  . apixaban  2.5 mg Oral BID  . bisoprolol  10 mg Oral BID  . Chlorhexidine Gluconate Cloth  6 each Topical Daily  . finasteride  5 mg Oral Daily  . furosemide  80 mg Intravenous Once  . hydrALAZINE  50 mg Oral BID  . isosorbide mononitrate  60 mg Oral Daily  . levothyroxine  112 mcg Oral Q0600  . mometasone-formoterol  2 puff Inhalation BID  . sodium chloride  3 mL Nebulization BID  . temazepam  15 mg Oral Daily  . tolvaptan  15 mg Oral Once   PRN Meds: acetaminophen **OR** acetaminophen, ondansetron **OR** ondansetron (ZOFRAN) IV, prochlorperazine, senna-docusate Continuous Infusions:     LOS: 4 days  Time spent: Greater than 50% of the 35 minute visit was spent in counseling/coordination of care for the patient as laid out in the A&P.   Dwyane Dee, MD [facility] 11/28/2019, 11:54 AM  Contact via secure chat.  To contact the attending provider between 7A-7P or the covering provider during after hours 7P-7A, please log into the web  site [email or web address] and access using universal [place] password for that web site. If you do not have the password, please call the hospital operator.

## 2019-11-29 ENCOUNTER — Inpatient Hospital Stay (HOSPITAL_COMMUNITY): Payer: Medicare Other

## 2019-11-29 LAB — CBC WITH DIFFERENTIAL/PLATELET
Abs Immature Granulocytes: 0.06 10*3/uL (ref 0.00–0.07)
Basophils Absolute: 0.1 10*3/uL (ref 0.0–0.1)
Basophils Relative: 0 %
Eosinophils Absolute: 0.1 10*3/uL (ref 0.0–0.5)
Eosinophils Relative: 1 %
HCT: 28.2 % — ABNORMAL LOW (ref 39.0–52.0)
Hemoglobin: 9.7 g/dL — ABNORMAL LOW (ref 13.0–17.0)
Immature Granulocytes: 1 %
Lymphocytes Relative: 14 %
Lymphs Abs: 1.7 10*3/uL (ref 0.7–4.0)
MCH: 32.4 pg (ref 26.0–34.0)
MCHC: 34.4 g/dL (ref 30.0–36.0)
MCV: 94.3 fL (ref 80.0–100.0)
Monocytes Absolute: 1.1 10*3/uL — ABNORMAL HIGH (ref 0.1–1.0)
Monocytes Relative: 9 %
Neutro Abs: 8.8 10*3/uL — ABNORMAL HIGH (ref 1.7–7.7)
Neutrophils Relative %: 75 %
Platelets: 181 10*3/uL (ref 150–400)
RBC: 2.99 MIL/uL — ABNORMAL LOW (ref 4.22–5.81)
RDW: 14.2 % (ref 11.5–15.5)
WBC: 11.9 10*3/uL — ABNORMAL HIGH (ref 4.0–10.5)
nRBC: 0 % (ref 0.0–0.2)

## 2019-11-29 LAB — RENAL FUNCTION PANEL
Albumin: 3.3 g/dL — ABNORMAL LOW (ref 3.5–5.0)
Anion gap: 13 (ref 5–15)
BUN: 71 mg/dL — ABNORMAL HIGH (ref 8–23)
CO2: 29 mmol/L (ref 22–32)
Calcium: 8.9 mg/dL (ref 8.9–10.3)
Chloride: 89 mmol/L — ABNORMAL LOW (ref 98–111)
Creatinine, Ser: 1.84 mg/dL — ABNORMAL HIGH (ref 0.61–1.24)
GFR calc Af Amer: 35 mL/min — ABNORMAL LOW (ref >60)
GFR calc non Af Amer: 30 mL/min — ABNORMAL LOW (ref >60)
Glucose, Bld: 200 mg/dL — ABNORMAL HIGH (ref 70–99)
Phosphorus: 3.4 mg/dL (ref 2.5–4.6)
Potassium: 3.6 mmol/L (ref 3.5–5.1)
Sodium: 131 mmol/L — ABNORMAL LOW (ref 135–145)

## 2019-11-29 LAB — BASIC METABOLIC PANEL
Anion gap: 11 (ref 5–15)
Anion gap: 15 (ref 5–15)
Anion gap: 15 (ref 5–15)
BUN: 74 mg/dL — ABNORMAL HIGH (ref 8–23)
BUN: 73 mg/dL — ABNORMAL HIGH (ref 8–23)
BUN: 73 mg/dL — ABNORMAL HIGH (ref 8–23)
CO2: 30 mmol/L (ref 22–32)
CO2: 29 mmol/L (ref 22–32)
CO2: 32 mmol/L (ref 22–32)
Calcium: 8.8 mg/dL — ABNORMAL LOW (ref 8.9–10.3)
Calcium: 8.9 mg/dL (ref 8.9–10.3)
Calcium: 9.2 mg/dL (ref 8.9–10.3)
Chloride: 88 mmol/L — ABNORMAL LOW (ref 98–111)
Chloride: 87 mmol/L — ABNORMAL LOW (ref 98–111)
Chloride: 87 mmol/L — ABNORMAL LOW (ref 98–111)
Creatinine, Ser: 1.88 mg/dL — ABNORMAL HIGH (ref 0.61–1.24)
Creatinine, Ser: 1.78 mg/dL — ABNORMAL HIGH (ref 0.61–1.24)
Creatinine, Ser: 1.87 mg/dL — ABNORMAL HIGH (ref 0.61–1.24)
GFR calc Af Amer: 34 mL/min — ABNORMAL LOW (ref >60)
GFR calc Af Amer: 37 mL/min — ABNORMAL LOW (ref >60)
GFR calc Af Amer: 35 mL/min — ABNORMAL LOW (ref >60)
GFR calc non Af Amer: 30 mL/min — ABNORMAL LOW (ref >60)
GFR calc non Af Amer: 32 mL/min — ABNORMAL LOW (ref >60)
GFR calc non Af Amer: 30 mL/min — ABNORMAL LOW (ref >60)
Glucose, Bld: 141 mg/dL — ABNORMAL HIGH (ref 70–99)
Glucose, Bld: 171 mg/dL — ABNORMAL HIGH (ref 70–99)
Glucose, Bld: 160 mg/dL — ABNORMAL HIGH (ref 70–99)
Potassium: 3.7 mmol/L (ref 3.5–5.1)
Potassium: 3.7 mmol/L (ref 3.5–5.1)
Potassium: 3.5 mmol/L (ref 3.5–5.1)
Sodium: 129 mmol/L — ABNORMAL LOW (ref 135–145)
Sodium: 131 mmol/L — ABNORMAL LOW (ref 135–145)
Sodium: 134 mmol/L — ABNORMAL LOW (ref 135–145)

## 2019-11-29 LAB — MAGNESIUM: Magnesium: 2.5 mg/dL — ABNORMAL HIGH (ref 1.7–2.4)

## 2019-11-29 MED ORDER — FUROSEMIDE 10 MG/ML IJ SOLN
80.0000 mg | Freq: Once | INTRAMUSCULAR | Status: AC
  Administered 2019-11-30: 80 mg via INTRAVENOUS
  Filled 2019-11-29: qty 8

## 2019-11-29 MED ORDER — FUROSEMIDE 10 MG/ML IJ SOLN
80.0000 mg | Freq: Once | INTRAMUSCULAR | Status: AC
  Administered 2019-11-29: 80 mg via INTRAVENOUS
  Filled 2019-11-29: qty 8

## 2019-11-29 MED ORDER — NITROGLYCERIN 0.4 MG SL SUBL
0.4000 mg | SUBLINGUAL_TABLET | SUBLINGUAL | Status: DC | PRN
  Administered 2019-11-29: 0.4 mg via SUBLINGUAL

## 2019-11-29 MED ORDER — MORPHINE SULFATE (PF) 2 MG/ML IV SOLN
1.0000 mg | Freq: Once | INTRAVENOUS | Status: AC
  Administered 2019-11-30: 1 mg via INTRAVENOUS
  Filled 2019-11-29: qty 1

## 2019-11-29 MED ORDER — NITROGLYCERIN 0.4 MG SL SUBL
SUBLINGUAL_TABLET | SUBLINGUAL | Status: AC
  Administered 2019-11-29: 0.4 mg
  Filled 2019-11-29: qty 1

## 2019-11-29 NOTE — Progress Notes (Signed)
[facility] Progress Note  Name: [name] MRN: [id number] DOB: 1922/08/20  Chief Complaint:  Low sodium at PCP   Subjective:  Had 1.2 liters UOP over 10/1. Got lasix 80 mg IV once and tolvaptan 15 mg on 10/1.  Spoke with his caregiver at bedside.  He has had another lasix 80 mg IV this morning for shortness of breath.   He has been intermittently confused but overall a little better mentally   Review of systems:    He states shortness of breath is better; no chest pain  Denies any more n/v  ------------ Background on consult:  [name] is a 86 y.o. male with a history of BPH, CKD, chronic combined systolic and diastolic CHF, and HTN who presented to the hospital after abnormal labs at his physician's office.  He was found to have a sodium of 112 on labs with PCP per report.  He received 1 L normal saline bolus in the ER.  Sodium went from 115 prebolus to 118 after the bolus.  He is on Lasix and metolazone as below.  Note that he has a history of prior hyponatremia per charting.  Baseline Cr near 2 recently.  He had the labs today at his PCP's office.  His daughter is at bedside and serves as Risk manager.  He has had some nausea and has gotten a PRN ordered.  His daughter states that she has noticed some confusion.  The patient states that he was taken off of his Lasix on Friday 9/24 as he was not eating well.  He has been trying to drink lots of Gatorade.  He initially tells me he has never had a suprapubic catheter and then recalls that yes at one point he did.     Intake/Output Summary (Last 24 hours) at 11/29/2019 1357 Last data filed at 11/29/2019 1300 Gross per 24 hour  Intake 300 ml  Output 1550 ml  Net -1250 ml    Vitals:  Vitals:   11/29/19 0800 11/29/19 0816 11/29/19 0845 11/29/19 1332  BP: (!) 153/101 (!) 163/89 (!) 153/70 138/64  Pulse:      Resp:      Temp:    98.1 F (36.7 C)  TempSrc:      SpO2:      Weight:      Height:          Physical Exam:   General: elderly male in bed in NAD HEENT: NCAT Neck: no JVD supple trachea midline Heart: S1S2 no rub Lungs: anteriorly with transmitted upper airway sounds; unlabored at rest  Abdomen: soft nontender obese habitus no suprapubic Extremities: no pitting edema appreciated Neuro:sleepy but conversant; oriented to month and year (two guesses for year) and states name and that we are at [facility].  hard of hearing provides hx  Psych no anxiety or agitation  GU foley is out clearing hematuria   Medications reviewed   Labs:  BMP Latest Ref Rng & Units 11/29/2019 11/29/2019 11/28/2019  Glucose 70 - 99 mg/dL 171(H) 141(H) 162(H)  BUN 8 - 23 mg/dL 73(H) 74(H) 79(H)  Creatinine 0.61 - 1.24 mg/dL 1.78(H) 1.88(H) 1.86(H)  BUN/Creat Ratio 10 - 24 - - -  Sodium 135 - 145 mmol/L 131(L) 129(L) 129(L)  Potassium 3.5 - 5.1 mmol/L 3.7 3.7 3.7  Chloride 98 - 111 mmol/L 87(L) 88(L) 88(L)  CO2 22 - 32 mmol/L 29 30 28   Calcium 8.9 - 10.3 mg/dL 8.9 8.8(L) 8.7(L)  Assessment/Plan:   #Hyponatremia - Improving on presentation to ER and s/p 1 L bolus normal saline and NS at 50 /hr; NS was stopped when Na reached 122 per order.  tolvaptan 7.5 mg once on 9/29.  Got lasix 60 mg IV once on 9/30 then had lasix 80 mg IV once and tolvaptan 15 mg once on 10/1  - Improving on current regimen  - lasix 80 mg IV earlier this am and plan for another 80 mg IV tonight and in AM - reassess transition to PO lasix soon  - TSH slightly up at 8.286 - levothyroxine per primary team  - renal panel in AM -discontinued every 8 hour BMP's  # Urinary retention - hx BPH on finasteride - foley is out - monitor for retention  # CKD stage 4 - baseline Cr is 2    - they request follow-up in office with [facility] on discharge    #HTN  - follow on current regimen  # AMS - hyponatremia is improving - may have component of delirium   # Chronic combined systolic and diastolic CHF  - noted on  outpatient diuretic regimen of 80 mg BID and metolazone twice a week (had been very swollen 6-8 weeks ago per caregiver) - lasix as above  - strict ins/outs and daily weights  # Hypokalemia  - repleted  # hypophos  - is on regular diet; s/p iv repletion; resolved  Would continue inpatient monitoring   Eric Desanctis, MD 11/29/2019 2:20 PM

## 2019-11-29 NOTE — Progress Notes (Signed)
PROGRESS NOTE    KAICEN DESENA   LGX:[id number]  DOB: Jun 30, 1922  DOA: 11/24/2019     5  PCP: [name], MD  CC: fatigue, hyponatremia  Hospital Course: Mr. Mcparland is a 86 yo CM with PMH hard of hearing, obesity, macular degeneration, hypothyroidism, hyponatremia, hypertension, gout, GERD, CHF, BPH, A. fib, heart block s/p pacer who presented to the hospital with complaints of fatigue. Symptoms had been persistent for approximately 1 week prior to hospitalization. He has a caregiver at home and also recently was evaluated by his PCP prior to admission and found to be hyponatremic. Outpatient remedies were unsuccessful and repeat sodium level had not improved and he was referred to the ER for further evaluation.  On work-up in the ER his sodium was 115. He was started on normal saline and BMPs were trended. Nephrology was also consulted while hospitalized.   Interval History:  Patient was hypotensive yesterday afternoon and responded well to small 500 cc normal saline bolus. This morning he was short of breath, no chest pain and asking for nitroglycerin which he takes at home when he feels this way.  He took 2 with minimal relief. CXR was obtained which revealed pulmonary edema and atelectasis.  He was reordered a dose of Lasix as well as incentive spirometry.  He did improve with ongoing monitoring.  Mentation remained normal.  Old records reviewed in assessment of this patient  ROS: Constitutional: negative for chills and fevers, Respiratory: negative for cough, Cardiovascular: negative for chest pain and Gastrointestinal: negative for abdominal pain  Assessment & Plan: * Hyponatremia - recurrent problem in past as well (historically on HCTZ per note review), now per med rec is on lasix PO 80 mg BID and metolazone 2.5 mg on Sun/Wed; possible that he is overdiuresed as noted with dilute urine in setting of diuretics - serum osmo 274 and urine osmo 311 -Tolvaptan ordered x1 per  nephrology on 9/29. Repeat dose again on 10/1 - repeat dose of lasix also ordered per nephrology on 10/1, will follow up responses; seems to be improving  (had to give 500 cc NS bolus on 10/1 due to hypotension/lethargy which resolved after IVF) - nephrology following, greatly appreciate assistance - mild improvement/stability of BMP, will continue BMP on q8h checks - continue I&Os  Chronic systolic CHF (congestive heart failure) (HCC) -Given large amount of Lasix dose at home, will repeat echo to reevaluate EF - EF 45-50%, indeterminate diastology grade - would expect to be able to reduce home lasix dose at discharge - some SOB/worsening pulm edema (and atelectasis) on 10/2 (of note, gave patient 500 cc NS bolus on 10/1 late afternoon due to hypoTN/lethargy, which improved after IVF) - repeating dose of lasix 80 mg IV this am; CXR shows edema/atelectasis  Acute renal failure superimposed on stage 3b chronic kidney disease ([place]) -Baseline creatinine around 2 -Creatinine is 2.55 on admission and improved with fluids -Continue trending BMP -Patient plans to establish outpatient with nephrology at discharge  Atrial fibrillation ([place]) -Continue bisoprolol (holding for now with soft BP and borderline bradycardia) - resume eliquis; monitor hematuria and Hgb (mild decrease but also received IVF)  BPH (benign prostatic hyperplasia) -Continue finasteride  Hypothyroidism -Continue Synthroid  Essential hypertension - intermittent hypoTN at this time;  Holding meds temp and will resume as able  - will use PRN if becomes elevated as well -Hold bisoprolol, hydralazine, Imdur, irbesartan  Normocytic anemia -No evidence of bleeding -Follow-up iron studies: normal   Hypokalemia -Replete  and recheck as needed   Antimicrobials: None  DVT prophylaxis: Eliquis Code Status: DNR Family Communication: Caretaker bedside Disposition Plan: Status is: Inpatient  Remains inpatient appropriate  because:IV treatments appropriate due to intensity of illness or inability to take PO and Inpatient level of care appropriate due to severity of illness   Dispo: The patient is from: Home              Anticipated d/c is to: Home              Anticipated d/c date is: 2 days              Patient currently is not medically stable to d/c.  Objective: Blood pressure (!) 153/70, pulse 64, temperature 97.9 F (36.6 C), temperature source Oral, resp. rate 19, height 6' (1.829 m), weight 100 kg, SpO2 98 %.  Examination: General appearance: Pleasant elderly man appearing more comfortable now after treatments and resting in bed with caretaker bedside Head: Normocephalic, without obvious abnormality, atraumatic Eyes: EOMI Lungs: Bibasilar crackles Heart: regular rate and rhythm and S1, S2 normal Abdomen: normal findings: bowel sounds normal and soft, non-tender Extremities: Trace lower extremity edema Skin: mobility and turgor normal Neurologic: Grossly normal  Consultants:   Nephrology  Procedures:   None  Data Reviewed: I have personally reviewed following labs and imaging studies Results for orders placed or performed during the hospital encounter of 11/24/19 (from the past 24 hour(s))  Basic metabolic panel     Status: Abnormal   Collection Time: 11/28/19  1:24 PM  Result Value Ref Range   Sodium 127 (L) 135 - 145 mmol/L   Potassium 3.4 (L) 3.5 - 5.1 mmol/L   Chloride 88 (L) 98 - 111 mmol/L   CO2 27 22 - 32 mmol/L   Glucose, Bld 203 (H) 70 - 99 mg/dL   BUN 72 (H) 8 - 23 mg/dL   Creatinine, Ser 1.96 (H) 0.61 - 1.24 mg/dL   Calcium 8.4 (L) 8.9 - 10.3 mg/dL   GFR calc non Af Amer 28 (L) >60 mL/min   GFR calc Af Amer 33 (L) >60 mL/min   Anion gap 12 5 - 15  TSH     Status: Abnormal   Collection Time: 11/28/19  1:24 PM  Result Value Ref Range   TSH 8.286 (H) 0.350 - 4.500 uIU/mL  Phosphorus     Status: None   Collection Time: 11/28/19  1:24 PM  Result Value Ref Range    Phosphorus 3.2 2.5 - 4.6 mg/dL  Basic metabolic panel     Status: Abnormal   Collection Time: 11/28/19 10:38 PM  Result Value Ref Range   Sodium 129 (L) 135 - 145 mmol/L   Potassium 3.7 3.5 - 5.1 mmol/L   Chloride 88 (L) 98 - 111 mmol/L   CO2 28 22 - 32 mmol/L   Glucose, Bld 162 (H) 70 - 99 mg/dL   BUN 79 (H) 8 - 23 mg/dL   Creatinine, Ser 1.86 (H) 0.61 - 1.24 mg/dL   Calcium 8.7 (L) 8.9 - 10.3 mg/dL   GFR calc non Af Amer 30 (L) >60 mL/min   GFR calc Af Amer 35 (L) >60 mL/min   Anion gap 13 5 - 15  Magnesium     Status: Abnormal   Collection Time: 11/29/19  5:14 AM  Result Value Ref Range   Magnesium 2.5 (H) 1.7 - 2.4 mg/dL  CBC with Differential/Platelet     Status: Abnormal   Collection  Time: 11/29/19  5:14 AM  Result Value Ref Range   WBC 11.9 (H) 4.0 - 10.5 K/uL   RBC 2.99 (L) 4.22 - 5.81 MIL/uL   Hemoglobin 9.7 (L) 13.0 - 17.0 g/dL   HCT 28.2 (L) 39 - 52 %   MCV 94.3 80.0 - 100.0 fL   MCH 32.4 26.0 - 34.0 pg   MCHC 34.4 30.0 - 36.0 g/dL   RDW 14.2 11.5 - 15.5 %   Platelets 181 150 - 400 K/uL   nRBC 0.0 0.0 - 0.2 %   Neutrophils Relative % 75 %   Neutro Abs 8.8 (H) 1.7 - 7.7 K/uL   Lymphocytes Relative 14 %   Lymphs Abs 1.7 0.7 - 4.0 K/uL   Monocytes Relative 9 %   Monocytes Absolute 1.1 (H) 0 - 1 K/uL   Eosinophils Relative 1 %   Eosinophils Absolute 0.1 0 - 0 K/uL   Basophils Relative 0 %   Basophils Absolute 0.1 0 - 0 K/uL   Immature Granulocytes 1 %   Abs Immature Granulocytes 0.06 0.00 - 0.07 K/uL  Basic metabolic panel     Status: Abnormal   Collection Time: 11/29/19  5:14 AM  Result Value Ref Range   Sodium 129 (L) 135 - 145 mmol/L   Potassium 3.7 3.5 - 5.1 mmol/L   Chloride 88 (L) 98 - 111 mmol/L   CO2 30 22 - 32 mmol/L   Glucose, Bld 141 (H) 70 - 99 mg/dL   BUN 74 (H) 8 - 23 mg/dL   Creatinine, Ser 1.88 (H) 0.61 - 1.24 mg/dL   Calcium 8.8 (L) 8.9 - 10.3 mg/dL   GFR calc non Af Amer 30 (L) >60 mL/min   GFR calc Af Amer 34 (L) >60 mL/min   Anion  gap 11 5 - 15    Recent Results (from the past 240 hour(s))  Respiratory Panel by RT PCR (Flu A&B, Covid) - Nasopharyngeal Swab     Status: None   Collection Time: 11/24/19 12:16 PM   Specimen: Nasopharyngeal Swab  Result Value Ref Range Status   SARS Coronavirus 2 by RT PCR NEGATIVE NEGATIVE Final    Comment: (NOTE) SARS-CoV-2 target nucleic acids are NOT DETECTED.  The SARS-CoV-2 RNA is generally detectable in upper respiratoy specimens during the acute phase of infection. The lowest concentration of SARS-CoV-2 viral copies this assay can detect is 131 copies/mL. A negative result does not preclude SARS-Cov-2 infection and should not be used as the sole basis for treatment or other patient management decisions. A negative result may occur with  improper specimen collection/handling, submission of specimen other than nasopharyngeal swab, presence of viral mutation(s) within the areas targeted by this assay, and inadequate number of viral copies (<131 copies/mL). A negative result must be combined with clinical observations, patient history, and epidemiological information. The expected result is Negative.  Fact Sheet for Patients:  PinkCheek.be  Fact Sheet for Healthcare Providers:  GravelBags.it  This test is no t yet approved or cleared by the [place] FDA and  has been authorized for detection and/or diagnosis of SARS-CoV-2 by FDA under an Emergency Use Authorization (EUA). This EUA will remain  in effect (meaning this test can be used) for the duration of the COVID-19 declaration under Section 564(b)(1) of the Act, 21 U.S.C. section 360bbb-3(b)(1), unless the authorization is terminated or revoked sooner.     Influenza A by PCR NEGATIVE NEGATIVE Final   Influenza B by PCR NEGATIVE NEGATIVE Final  Comment: (NOTE) The Xpert Xpress SARS-CoV-2/FLU/RSV assay is intended as an aid in  the diagnosis of influenza  from Nasopharyngeal swab specimens and  should not be used as a sole basis for treatment. Nasal washings and  aspirates are unacceptable for Xpert Xpress SARS-CoV-2/FLU/RSV  testing.  Fact Sheet for Patients: PinkCheek.be  Fact Sheet for Healthcare Providers: GravelBags.it  This test is not yet approved or cleared by the [place] FDA and  has been authorized for detection and/or diagnosis of SARS-CoV-2 by  FDA under an Emergency Use Authorization (EUA). This EUA will remain  in effect (meaning this test can be used) for the duration of the  Covid-19 declaration under Section 564(b)(1) of the Act, 21  U.S.C. section 360bbb-3(b)(1), unless the authorization is  terminated or revoked. Performed at [facility], [place] [address] [place], [place] [address]      Radiology Studies: DG CHEST PORT 1 VIEW  Result Date: 11/29/2019 CLINICAL DATA:  Dyspnea, CHF EXAM: PORTABLE CHEST 1 VIEW COMPARISON:  11/24/2019 chest radiograph. FINDINGS: Stable configuration of 2 lead left subclavian pacemaker. Stable cardiomediastinal silhouette with mild cardiomegaly. No pneumothorax. No pleural effusion. Mild pulmonary edema appears slightly worsened. Mild bibasilar atelectasis. IMPRESSION: 1. Mild congestive heart failure, slightly worsened. 2. Mild bibasilar atelectasis. Electronically Signed   By: Ilona Sorrel M.D.   On: 11/29/2019 09:22   DG CHEST PORT 1 VIEW  Final Result    DG CHEST PORT 1 VIEW  Final Result      Scheduled Meds: . allopurinol  100 mg Oral Daily  . apixaban  2.5 mg Oral BID  . Chlorhexidine Gluconate Cloth  6 each Topical Daily  . finasteride  5 mg Oral Daily  . levothyroxine  112 mcg Oral Q0600  . mometasone-formoterol  2 puff Inhalation BID  . sodium chloride  3 mL Nebulization BID  . temazepam  15 mg Oral Daily   PRN Meds: acetaminophen **OR** acetaminophen, alum & mag hydroxide-simeth,  ondansetron **OR** ondansetron (ZOFRAN) IV, prochlorperazine, senna-docusate Continuous Infusions:     LOS: 5 days  Time spent: Greater than 50% of the 35 minute visit was spent in counseling/coordination of care for the patient as laid out in the A&P.   Dwyane Dee, MD [facility] 11/29/2019, 12:40 PM  Contact via secure chat.  To contact the attending provider between 7A-7P or the covering provider during after hours 7P-7A, please log into the web site [email or web address] and access using universal [place] password for that web site. If you do not have the password, please call the hospital operator.

## 2019-11-29 NOTE — Progress Notes (Signed)
Physical Therapy Treatment Patient Details Name: [name] MRN: [id number] DOB: 1923-02-21 Today's Date: 11/29/2019    History of Present Illness 86 yo male admitted with hyponatremia. Hx of Sz, CHF, CKD, A fib, macular degeneration, pacemaker, obesity, gout    PT Comments    Pt with wet breath sounds and dyspnea on exertion upon arrival to room, pt eager to get OOB to improve breathing. Pt required min assist for bed mobility and transfer OOB to recliner, with multiple productive coughs throughout mobility and improved breath sounds afterwards. Pt performed LE exercise in recliner, and PT encouraged continued exercise while up upon PT exit. Pt's daughter in room, very supportive. Will continue to follow acutely.     Follow Up Recommendations  Home health PT;Supervision/Assistance - 24 hour     Equipment Recommendations  None recommended by PT    Recommendations for Other Services       Precautions / Restrictions Precautions Precautions: Fall Restrictions Weight Bearing Restrictions: No    Mobility  Bed Mobility Overal bed mobility: Needs Assistance Bed Mobility: Supine to Sit     Supine to sit: Min assist;HOB elevated     General bed mobility comments: min assist for translating LEs to EOB, trunk elevation, and scooting to EOB.  Transfers Overall transfer level: Needs assistance Equipment used: Rolling walker (2 wheeled) Transfers: Sit to/from [facility] Sit to Stand: Min assist Stand pivot transfers: Min assist       General transfer comment: min assist for power up, steadying, and slow eccentric lower into recliner. Pt taking small pivotal steps to reach recliner with PT assisting with RW navigation.  Ambulation/Gait             General Gait Details: NT - pt fatigued   Stairs             Wheelchair Mobility    Modified Rankin (Stroke Patients Only)       Balance Overall balance assessment: Needs  assistance Sitting-balance support: Feet supported Sitting balance-Leahy Scale: Fair     Standing balance support: During functional activity;Bilateral upper extremity supported Standing balance-Leahy Scale: Poor Standing balance comment: reliant on external assist                            Cognition Arousal/Alertness: Awake/alert Behavior During Therapy: WFL for tasks assessed/performed Overall Cognitive Status: Within Functional Limits for tasks assessed                                 General Comments: pleasant affect, funny      Exercises General Exercises - Lower Extremity Long Arc Quad: AROM;Both;10 reps;Seated    General Comments General comments (skin integrity, edema, etc.): gurgling breath sounds upon arrival to room, improved when upright at EOB and in recliner. SpO2 minimum on RA during transfer 86%, placed on 2LO2 to recover.      Pertinent Vitals/Pain Pain Assessment: No/denies pain    Home Living                      Prior Function            PT Goals (current goals can now be found in the care plan section) Acute Rehab PT Goals Patient Stated Goal: to get strength back. to return home. PT Goal Formulation: With patient/family Time For Goal Achievement: 12/10/19 Potential to Achieve Goals:  Good Progress towards PT goals: Progressing toward goals    Frequency    Min 3X/week      PT Plan Current plan remains appropriate    Co-evaluation              AM-PAC PT "6 Clicks" Mobility   Outcome Measure  Help needed turning from your back to your side while in a flat bed without using bedrails?: A Little Help needed moving from lying on your back to sitting on the side of a flat bed without using bedrails?: A Little Help needed moving to and from a bed to a chair (including a wheelchair)?: A Little Help needed standing up from a chair using your arms (e.g., wheelchair or bedside chair)?: A Little Help needed  to walk in hospital room?: A Little Help needed climbing 3-5 steps with a railing? : A Lot 6 Click Score: 17    End of Session Equipment Utilized During Treatment: Gait belt Activity Tolerance: Patient tolerated treatment well Patient left: in chair;with call bell/phone within reach;with family/visitor present Nurse Communication: Mobility status PT Visit Diagnosis: Muscle weakness (generalized) (M62.81);Difficulty in walking, not elsewhere classified (R26.2)     Time: 1500-1520 PT Time Calculation (min) (ACUTE ONLY): 20 min  Charges:  $Therapeutic Activity: 8-22 mins                     Analina Filla E, PT Acute Rehabilitation Services Pager [phone]  Office [phone]    Elnora Quizon D Elonda Husky 11/29/2019, 3:50 PM

## 2019-11-30 DIAGNOSIS — G9341 Metabolic encephalopathy: Secondary | ICD-10-CM

## 2019-11-30 DIAGNOSIS — D72829 Elevated white blood cell count, unspecified: Secondary | ICD-10-CM

## 2019-11-30 LAB — BASIC METABOLIC PANEL
Anion gap: 15 (ref 5–15)
BUN: 71 mg/dL — ABNORMAL HIGH (ref 8–23)
CO2: 29 mmol/L (ref 22–32)
Calcium: 9.2 mg/dL (ref 8.9–10.3)
Chloride: 90 mmol/L — ABNORMAL LOW (ref 98–111)
Creatinine, Ser: 1.64 mg/dL — ABNORMAL HIGH (ref 0.61–1.24)
GFR calc Af Amer: 41 mL/min — ABNORMAL LOW (ref >60)
GFR calc non Af Amer: 35 mL/min — ABNORMAL LOW (ref >60)
Glucose, Bld: 142 mg/dL — ABNORMAL HIGH (ref 70–99)
Potassium: 3.5 mmol/L (ref 3.5–5.1)
Sodium: 134 mmol/L — ABNORMAL LOW (ref 135–145)

## 2019-11-30 LAB — CBC WITH DIFFERENTIAL/PLATELET
Abs Immature Granulocytes: 0.07 10*3/uL (ref 0.00–0.07)
Basophils Absolute: 0.1 10*3/uL (ref 0.0–0.1)
Basophils Relative: 0 %
Eosinophils Absolute: 0.1 10*3/uL (ref 0.0–0.5)
Eosinophils Relative: 0 %
HCT: 28.7 % — ABNORMAL LOW (ref 39.0–52.0)
Hemoglobin: 9.8 g/dL — ABNORMAL LOW (ref 13.0–17.0)
Immature Granulocytes: 0 %
Lymphocytes Relative: 10 %
Lymphs Abs: 1.8 10*3/uL (ref 0.7–4.0)
MCH: 32.2 pg (ref 26.0–34.0)
MCHC: 34.1 g/dL (ref 30.0–36.0)
MCV: 94.4 fL (ref 80.0–100.0)
Monocytes Absolute: 1.2 10*3/uL — ABNORMAL HIGH (ref 0.1–1.0)
Monocytes Relative: 7 %
Neutro Abs: 14 10*3/uL — ABNORMAL HIGH (ref 1.7–7.7)
Neutrophils Relative %: 83 %
Platelets: 210 10*3/uL (ref 150–400)
RBC: 3.04 MIL/uL — ABNORMAL LOW (ref 4.22–5.81)
RDW: 14.3 % (ref 11.5–15.5)
WBC: 17.1 10*3/uL — ABNORMAL HIGH (ref 4.0–10.5)
nRBC: 0 % (ref 0.0–0.2)

## 2019-11-30 LAB — T4, FREE: Free T4: 1.19 ng/dL — ABNORMAL HIGH (ref 0.61–1.12)

## 2019-11-30 LAB — MAGNESIUM: Magnesium: 2.6 mg/dL — ABNORMAL HIGH (ref 1.7–2.4)

## 2019-11-30 MED ORDER — HYDRALAZINE HCL 25 MG PO TABS: 25.0000 mg | ORAL_TABLET | Freq: Three times a day (TID) | ORAL | 3 refills | Status: AC

## 2019-11-30 MED ORDER — FUROSEMIDE 40 MG PO TABS: 80.0000 mg | ORAL_TABLET | Freq: Two times a day (BID) | ORAL | Status: DC

## 2019-11-30 MED ORDER — HYDRALAZINE HCL 50 MG PO TABS
50.0000 mg | ORAL_TABLET | Freq: Two times a day (BID) | ORAL | Status: DC
  Administered 2019-11-30: 50 mg via ORAL
  Filled 2019-11-30: qty 1

## 2019-11-30 MED ORDER — POTASSIUM CHLORIDE CRYS ER 10 MEQ PO TBCR: 10.0000 meq | EXTENDED_RELEASE_TABLET | Freq: Every day | ORAL | Status: DC

## 2019-11-30 NOTE — Discharge Summary (Signed)
Physician Discharge Summary  PRIMITIVO MERKEY YWV:[id number] DOB: 1922/10/03 DOA: 11/24/2019  PCP: [name], MD  Admit date: 11/24/2019 Discharge date: 11/30/2019  Admitted From: Home Disposition:  Home Discharging physician: Dwyane Dee, MD  Recommendations for Outpatient Follow-up:  1. Follow up with nephrology 2. Review BP, add back further meds if elevated (see below) 3. Repeat TSH in ~4 weeks   Patient discharged to Home in Discharge Condition: stable CODE STATUS: DNR Diet recommendation:  Diet Orders (From admission, onward)    Start     Ordered   11/30/19 0000  Diet - low sodium heart healthy        11/30/19 1457   11/24/19 1724  Diet regular Room service appropriate? Yes; Fluid consistency: Thin  Diet effective now       Question Answer Comment  Room service appropriate? Yes   Fluid consistency: Thin      11/24/19 1724          Hospital Course: Mr. Lancon is a 86 yo CM with PMH hard of hearing, obesity, macular degeneration, hypothyroidism, hyponatremia, hypertension, gout, GERD, CHF, BPH, A. fib, heart block s/p pacer who presented to the hospital with complaints of fatigue. Symptoms had been persistent for approximately 1 week prior to hospitalization. He has a caregiver at home and also recently was evaluated by his PCP prior to admission and found to be hyponatremic. Outpatient remedies were unsuccessful and repeat sodium level had not improved and he was referred to the ER for further evaluation.  On work-up in the ER his sodium was 115. He was started on normal saline and BMPs were trended. Nephrology was also consulted while hospitalized. He was initially given IVF with close monitoring and echo was repeated as well (EF 45-50%, indeterminate diastology); akinetic apex and mid inferoseptal segment is hypokinetic, otherwise remainder read as normal.  Na levels slowly improved and he was transitioned off IVF and back to lasix IV. He did have some volume  overload and SOB with pulm edema/atelectasis that responded well to ongoing doses of lasix.  His Na had improved to 134 at time of discharge.   At discharge his BP regimen was also modified as he was hypotensive on his home regimen in the hospital. Discontinued bisoprolol 10 mg BID, discontinued irbesartan 150 mg daily, discontinued imdur 60 mg daily. Changed hydralazine from 50 mg BID to 25 mg TID.  He may need resumption of possibly irbesartan or imdur at followup depending on BP response.   Diuretic regimen modified: Discontinued metolazone. Continued on home regimen on lasix 80 mg BID.   TSH elevated. FT4 was essentially normal (1.19, mildly high). Tt3 pending at discharge. Likely TSH is elevated in acute illness and needs repeat TSH in about 4 weeks. No change to home dose of Synthroid 112 mcg daily.    * Hyponatremia - recurrent problem in past as well (historically on HCTZ per note review), now per med rec is on lasix PO 80 mg BID and metolazone 2.5 mg on Sun/Wed; possible that he is overdiuresed as noted with dilute urine in setting of diuretics - serum osmo 274 and urine osmo 311 -Tolvaptan ordered x1 per nephrology on 9/29. Repeat dose again on 10/1 - repeat dose of lasix also ordered per nephrology on 10/1, will follow up responses; seems to be improving  (had to give 500 cc NS bolus on 10/1 due to hypotension/lethargy which resolved after IVF) - nephrology following, greatly appreciate assistance - Na now improving further; responded well  to lasix past 24 hours with increased UOP - Na 134 at discharge - d/c metolazone at discharge and resume on home lasix 80 mg BID   Chronic systolic CHF (congestive heart failure) ([place]) -Given large amount of Lasix dose at home, will repeat echo to reevaluate EF - EF 45-50%, indeterminate diastology grade - some SOB/worsening pulm edema (and atelectasis) on 10/2 (of note, gave patient 500 cc NS bolus on 10/1 late afternoon due to hypoTN/lethargy,  which improved after IVF) - has responded well to lasix 80 IV over the past 24 hrs with increased UOP; will discuss home dose of lasix with nephrology prior to d/c  -See hyponatremia for diuretic regimen  Acute renal failure superimposed on stage 3b chronic kidney disease ([place]) -Baseline creatinine around 2 -Creatinine is 2.55 on admission and improved with fluids -Creatinine 1.64 at discharge, BUN 71 -Continue trending BMP -Patient plans to establish outpatient with nephrology at discharge  Leukocytosis - steady uptrend over past few days; no bands noted; low suspicion for infection at this time; did have hematuria however on Eliquis as well. Does have atelectasis on CXR which may be causing reactive response as well - continue to monitor for s/s infection and if fever will get cultures, but no indication for empiric abx at this time  Hypothyroidism -Continue Synthroid - TSH 8.28 (previously 4.75 a year ago); possibly elevated in acute illness - check FT4 (mildly elevated but certainly not low) and TT3 (pending at discharge) - no adjustment to Synthroid - repeat TSH in 4 weeks  Atrial fibrillation (HCC) -Held bisoprolol (holding for now with soft BP and borderline bradycardia) - resume eliquis; monitor hematuria and Hgb (mild decrease but also received IVF) -Bisoprolol not resumed at discharge.  Heart rate did not rebound/become tachycardic -At follow-up visit if rate not controlled, can resume at likely lower dose  Acute metabolic encephalopathy-resolved as of 11/30/2019 - likely multifactorial at this time with underlying elevated BUN contributing as well as 86 years/hospitalization and at risk for delirium  - he has been pretty AO with proper mentation during my discussions but caregiver does note intermittent periods of confusion; will have to keep watching this and address as able; trend of BUN varies with looking back ranging 40s-70s this past year  BPH (benign prostatic  hyperplasia) -Continue finasteride  Essential hypertension - intermittent hypoTN at this time;  Holding meds temp and will resume as able  -Hold bisoprolol, Imdur, irbesartan - resuming hydralazine on 10/3; assess BP response = controlled - may need adjustment further at outpt follow up visit   Normocytic anemia -No evidence of bleeding -Follow-up iron studies: normal   Hypokalemia -Replete and recheck as needed    The patient's chronic medical conditions were treated accordingly per the patient's home medication regimen except as noted.  On day of discharge, patient was felt deemed stable for discharge. Patient/family member advised to call PCP or come back to ER if needed.   Discharge Diagnoses:   Principal Diagnosis: Hyponatremia  Active Hospital Problems   Diagnosis Date Noted  . Hyponatremia 11/24/2019    Priority: High  . Acute renal failure superimposed on stage 3b chronic kidney disease ([place]) 11/25/2019    Priority: Medium  . Chronic systolic CHF (congestive heart failure) ([place]) 11/25/2019    Priority: Medium  . Leukocytosis 11/30/2019    Priority: Low  . Hypothyroidism 11/25/2019    Priority: Low  . Atrial fibrillation ([place]) 07/18/2011    Priority: Low  . Hypokalemia 11/25/2019  .  Normocytic anemia 11/25/2019  . Essential hypertension 11/25/2019  . BPH (benign prostatic hyperplasia) 11/25/2019    Resolved Hospital Problems   Diagnosis Date Noted Date Resolved  . Acute metabolic encephalopathy 37/90/2409 11/30/2019    Priority: Low    Discharge Instructions    Diet - low sodium heart healthy   Complete by: As directed    Increase activity slowly   Complete by: As directed      Allergies as of 11/30/2019      Reactions   Hctz [hydrochlorothiazide]    Hyponatremia      Medication List    STOP taking these medications   azithromycin 250 MG tablet Commonly known as: ZITHROMAX   bisoprolol 10 MG tablet Commonly known as: ZEBETA   ipratropium  0.03 % nasal spray Commonly known as: ATROVENT   irbesartan 150 MG tablet Commonly known as: AVAPRO   isosorbide mononitrate 60 MG 24 hr tablet Commonly known as: IMDUR   metolazone 2.5 MG tablet Commonly known as: ZAROXOLYN     TAKE these medications   allopurinol 100 MG tablet Commonly known as: ZYLOPRIM Take 100 mg by mouth daily.   budesonide-formoterol 160-4.5 MCG/ACT inhaler Commonly known as: SYMBICORT Inhale 2 puffs into the lungs 2 (two) times daily.   Eliquis 2.5 MG Tabs tablet Generic drug: apixaban Take 2.5 mg by mouth 2 (two) times daily.   finasteride 5 MG tablet Commonly known as: PROSCAR Take 5 mg by mouth daily.   fluticasone 50 MCG/ACT nasal spray Commonly known as: FLONASE Place 2 sprays into both nostrils daily.   furosemide 80 MG tablet Commonly known as: LASIX TAKE 1 TABLET BY MOUTH  TWICE DAILY   hydrALAZINE 25 MG tablet Commonly known as: APRESOLINE Take 1 tablet (25 mg total) by mouth 3 (three) times daily. What changed:   medication strength  how much to take  when to take this   levothyroxine 112 MCG tablet Commonly known as: SYNTHROID Take 112 mcg by mouth daily before breakfast.   nitroGLYCERIN 0.4 MG SL tablet Commonly known as: NITROSTAT PLACE 1 TABLET UNDER THE TONGUE EVERY 5 MINUTES AS NEEDED FOR CHEST PAIN   omeprazole 20 MG capsule Commonly known as: PRILOSEC Take 20 mg by mouth at bedtime.   OneTouch Delica Lancets 73Z Misc USE TO SELF MONITOR BLOOD GLUCOSE TWICE DAILY   OneTouch Verio test strip Generic drug: glucose blood USE TO SELF MONITOR BLOOD GLUCOSE TWICE DAILY   polyethylene glycol 17 g packet Commonly known as: MIRALAX / GLYCOLAX Take 17 g by mouth daily.   psyllium 0.52 g capsule Commonly known as: REGULOID Take 0.52 g by mouth daily.   simethicone 80 MG chewable tablet Commonly known as: Gas-X Chew 1 tablet (80 mg total) by mouth every 6 (six) hours as needed (bloating).   sodium chloride  HYPERTONIC 3 % nebulizer solution Take by nebulization in the morning and at bedtime.   temazepam 15 MG capsule Commonly known as: RESTORIL Take 15 mg by mouth at bedtime.   traMADol 50 MG tablet Commonly known as: ULTRAM Take 1 tablet (50 mg total) by mouth every 6 (six) hours as needed for moderate pain. Post-operatively.   Zofran 8 MG tablet Generic drug: ondansetron Take 8 mg by mouth every 8 (eight) hours as needed for nausea or vomiting.       Follow-up Information    [name], MD. Schedule an appointment as soon as possible for a visit in 1 week(s).   Specialty: Internal Medicine  Contact information: [place] [address] [phone]        Claudia Desanctis, MD. Schedule an appointment as soon as possible for a visit in 2 week(s).   Specialty: Nephrology Contact information: [address] [place] [address] [phone]              Allergies  Allergen Reactions  . Hctz [Hydrochlorothiazide]     Hyponatremia     Consultations: Nephrology  Discharge Exam: BP (!) 134/48   Pulse 70   Temp (!) 97.1 F (36.2 C) (Oral)   Resp 18   Ht 6' (1.829 m)   Wt 98.8 kg   SpO2 97%   BMI 29.54 kg/m  General appearance: Pleasant elderly man appearing more comfortable now after treatments and resting in bed with caretaker bedside Head: Normocephalic, without obvious abnormality, atraumatic Eyes: EOMI Lungs: minimal crackles; mostly clear now Heart: regular rate and rhythm and S1, S2 normal Abdomen: normal findings: bowel sounds normal and soft, non-tender Extremities: Trace lower extremity edema Skin: mobility and turgor normal Neurologic: Grossly normal  The results of significant diagnostics from this hospitalization (including imaging, microbiology, ancillary and laboratory) are listed below for reference.   Microbiology: Recent Results (from the past 240 hour(s))  Respiratory Panel by RT PCR (Flu A&B, Covid) - Nasopharyngeal Swab      Status: None   Collection Time: 11/24/19 12:16 PM   Specimen: Nasopharyngeal Swab  Result Value Ref Range Status   SARS Coronavirus 2 by RT PCR NEGATIVE NEGATIVE Final    Comment: (NOTE) SARS-CoV-2 target nucleic acids are NOT DETECTED.  The SARS-CoV-2 RNA is generally detectable in upper respiratoy specimens during the acute phase of infection. The lowest concentration of SARS-CoV-2 viral copies this assay can detect is 131 copies/mL. A negative result does not preclude SARS-Cov-2 infection and should not be used as the sole basis for treatment or other patient management decisions. A negative result may occur with  improper specimen collection/handling, submission of specimen other than nasopharyngeal swab, presence of viral mutation(s) within the areas targeted by this assay, and inadequate number of viral copies (<131 copies/mL). A negative result must be combined with clinical observations, patient history, and epidemiological information. The expected result is Negative.  Fact Sheet for Patients:  PinkCheek.be  Fact Sheet for Healthcare Providers:  GravelBags.it  This test is no t yet approved or cleared by the [place] FDA and  has been authorized for detection and/or diagnosis of SARS-CoV-2 by FDA under an Emergency Use Authorization (EUA). This EUA will remain  in effect (meaning this test can be used) for the duration of the COVID-19 declaration under Section 564(b)(1) of the Act, 21 U.S.C. section 360bbb-3(b)(1), unless the authorization is terminated or revoked sooner.     Influenza A by PCR NEGATIVE NEGATIVE Final   Influenza B by PCR NEGATIVE NEGATIVE Final    Comment: (NOTE) The Xpert Xpress SARS-CoV-2/FLU/RSV assay is intended as an aid in  the diagnosis of influenza from Nasopharyngeal swab specimens and  should not be used as a sole basis for treatment. Nasal washings and  aspirates are  unacceptable for Xpert Xpress SARS-CoV-2/FLU/RSV  testing.  Fact Sheet for Patients: PinkCheek.be  Fact Sheet for Healthcare Providers: GravelBags.it  This test is not yet approved or cleared by the [place] FDA and  has been authorized for detection and/or diagnosis of SARS-CoV-2 by  FDA under an Emergency Use Authorization (EUA). This EUA will remain  in effect (meaning this test  can be used) for the duration of the  Covid-19 declaration under Section 564(b)(1) of the Act, 21  U.S.C. section 360bbb-3(b)(1), unless the authorization is  terminated or revoked. Performed at [facility], [place] [address], [place],  [address]      Labs: BNP (last 3 results) No results for input(s): BNP in the last 8760 hours. Basic Metabolic Panel: Recent Labs  Lab 11/26/19 0457 11/26/19 0457 11/26/19 1035 11/26/19 1637 11/27/19 0438 11/27/19 0947 11/28/19 0459 11/28/19 0459 11/28/19 1324 11/28/19 2238 11/29/19 0514 11/29/19 1236 11/29/19 1448 11/29/19 2108 11/30/19 0618  NA 123*   < > 120*   < > 123*  123*   < > 126*   < > 127*   < > 129* 131* 131* 134* 134*  K 3.2*   < > 3.2*  --  3.7  --  3.6   < > 3.4*   < > 3.7 3.7 3.6 3.5 3.5  CL 83*   < > 82*  --  86*  --  87*   < > 88*   < > 88* 87* 89* 87* 90*  CO2 28   < > 25  --  26  --  27   < > 27   < > 30 29 29  32 29  GLUCOSE 141*   < > 193*  --  147*  --  134*   < > 203*   < > 141* 171* 200* 160* 142*  BUN 70*   < > 69*  --  65*  --  71*   < > 72*   < > 74* 73* 71* 73* 71*  CREATININE 2.14*   < > 2.08*  --  1.89*  --  1.99*   < > 1.96*   < > 1.88* 1.78* 1.84* 1.87* 1.64*  CALCIUM 8.8*   < > 8.6*  --  8.8*  --  8.6*   < > 8.4*   < > 8.8* 8.9 8.9 9.2 9.2  MG 2.5*  --   --   --  2.7*  --  2.5*  --   --   --  2.5*  --   --   --  2.6*  PHOS 2.7  --  2.7  --  1.9*  --   --   --  3.2  --   --   --  3.4  --   --    < > = values in this interval not  displayed.   Liver Function Tests: Recent Labs  Lab 11/24/19 1308 11/24/19 2130 11/25/19 2113 11/26/19 0457 11/26/19 1035 11/27/19 0438 11/29/19 1448  AST 28  --   --   --   --   --   --   ALT 26  --   --   --   --   --   --   ALKPHOS 62  --   --   --   --   --   --   BILITOT 1.3*  --   --   --   --   --   --   PROT 6.8  --   --   --   --   --   --   ALBUMIN 4.0   < > 3.4* 3.6 3.6 3.4* 3.3*   < > = values in this interval not displayed.   No results for input(s): LIPASE, AMYLASE in the last 168 hours. No results for input(s): AMMONIA in  the last 168 hours. CBC: Recent Labs  Lab 11/24/19 1308 11/24/19 1308 11/25/19 0310 11/27/19 0438 11/28/19 0459 11/29/19 0514 11/30/19 0618  WBC 12.4*   < > 13.6* 14.7* 12.8* 11.9* 17.1*  NEUTROABS 9.8*  --   --  11.8* 9.7* 8.8* 14.0*  HGB 11.2*   < > 11.0* 9.9* 10.0* 9.7* 9.8*  HCT 30.6*   < > 30.1* 28.4* 28.7* 28.2* 28.7*  MCV 88.2   < > 89.1 90.7 92.3 94.3 94.4  PLT 191   < > 187 175 176 181 210   < > = values in this interval not displayed.   Cardiac Enzymes: No results for input(s): CKTOTAL, CKMB, CKMBINDEX, TROPONINI in the last 168 hours. BNP: Invalid input(s): POCBNP CBG: No results for input(s): GLUCAP in the last 168 hours. D-Dimer No results for input(s): DDIMER in the last 72 hours. Hgb A1c No results for input(s): HGBA1C in the last 72 hours. Lipid Profile No results for input(s): CHOL, HDL, LDLCALC, TRIG, CHOLHDL, LDLDIRECT in the last 72 hours. Thyroid function studies Recent Labs    11/28/19 1324  TSH 8.286*   Anemia work up No results for input(s): VITAMINB12, FOLATE, FERRITIN, TIBC, IRON, RETICCTPCT in the last 72 hours. Urinalysis    Component Value Date/Time   COLORURINE YELLOW 11/24/2019 1336   APPEARANCEUR CLEAR 11/24/2019 1336   LABSPEC 1.011 11/24/2019 1336   PHURINE 5.0 11/24/2019 1336   GLUCOSEU NEGATIVE 11/24/2019 1336   HGBUR SMALL (A) 11/24/2019 1336   BILIRUBINUR NEGATIVE 11/24/2019  1336   KETONESUR NEGATIVE 11/24/2019 1336   PROTEINUR NEGATIVE 11/24/2019 1336   UROBILINOGEN 0.2 11/04/2012 1955   NITRITE NEGATIVE 11/24/2019 1336   LEUKOCYTESUR NEGATIVE 11/24/2019 1336   Sepsis Labs Invalid input(s): PROCALCITONIN,  WBC,  LACTICIDVEN Microbiology Recent Results (from the past 240 hour(s))  Respiratory Panel by RT PCR (Flu A&B, Covid) - Nasopharyngeal Swab     Status: None   Collection Time: 11/24/19 12:16 PM   Specimen: Nasopharyngeal Swab  Result Value Ref Range Status   SARS Coronavirus 2 by RT PCR NEGATIVE NEGATIVE Final    Comment: (NOTE) SARS-CoV-2 target nucleic acids are NOT DETECTED.  The SARS-CoV-2 RNA is generally detectable in upper respiratoy specimens during the acute phase of infection. The lowest concentration of SARS-CoV-2 viral copies this assay can detect is 131 copies/mL. A negative result does not preclude SARS-Cov-2 infection and should not be used as the sole basis for treatment or other patient management decisions. A negative result may occur with  improper specimen collection/handling, submission of specimen other than nasopharyngeal swab, presence of viral mutation(s) within the areas targeted by this assay, and inadequate number of viral copies (<131 copies/mL). A negative result must be combined with clinical observations, patient history, and epidemiological information. The expected result is Negative.  Fact Sheet for Patients:  PinkCheek.be  Fact Sheet for Healthcare Providers:  GravelBags.it  This test is no t yet approved or cleared by the [place] FDA and  has been authorized for detection and/or diagnosis of SARS-CoV-2 by FDA under an Emergency Use Authorization (EUA). This EUA will remain  in effect (meaning this test can be used) for the duration of the COVID-19 declaration under Section 564(b)(1) of the Act, 21 U.S.C. section 360bbb-3(b)(1), unless the  authorization is terminated or revoked sooner.     Influenza A by PCR NEGATIVE NEGATIVE Final   Influenza B by PCR NEGATIVE NEGATIVE Final    Comment: (NOTE) The Xpert Xpress SARS-CoV-2/FLU/RSV assay is  intended as an aid in  the diagnosis of influenza from Nasopharyngeal swab specimens and  should not be used as a sole basis for treatment. Nasal washings and  aspirates are unacceptable for Xpert Xpress SARS-CoV-2/FLU/RSV  testing.  Fact Sheet for Patients: PinkCheek.be  Fact Sheet for Healthcare Providers: GravelBags.it  This test is not yet approved or cleared by the [place] FDA and  has been authorized for detection and/or diagnosis of SARS-CoV-2 by  FDA under an Emergency Use Authorization (EUA). This EUA will remain  in effect (meaning this test can be used) for the duration of the  Covid-19 declaration under Section 564(b)(1) of the Act, 21  U.S.C. section 360bbb-3(b)(1), unless the authorization is  terminated or revoked. Performed at [facility], [place] [address] [place], [place] [address]     Procedures/Studies: DG Chest 2 View  Result Date: 11/04/2019 CLINICAL DATA:  Bronchitis EXAM: CHEST - 2 VIEW COMPARISON:  09/16/2019, 11/14/2016 FINDINGS: Frontal and lateral views of the chest demonstrate dual lead pacer unchanged. Cardiac silhouette is stable. Chronic interstitial scarring throughout the lungs unchanged since 2018. No acute airspace disease, effusion, or pneumothorax. No acute bony abnormalities. IMPRESSION: 1. Stable exam, no acute process. Electronically Signed   By: Randa Ngo M.D.   On: 11/04/2019 16:15   DG CHEST PORT 1 VIEW  Result Date: 11/29/2019 CLINICAL DATA:  Dyspnea, CHF EXAM: PORTABLE CHEST 1 VIEW COMPARISON:  11/24/2019 chest radiograph. FINDINGS: Stable configuration of 2 lead left subclavian pacemaker. Stable cardiomediastinal silhouette with mild cardiomegaly. No  pneumothorax. No pleural effusion. Mild pulmonary edema appears slightly worsened. Mild bibasilar atelectasis. IMPRESSION: 1. Mild congestive heart failure, slightly worsened. 2. Mild bibasilar atelectasis. Electronically Signed   By: Ilona Sorrel M.D.   On: 11/29/2019 09:22   DG CHEST PORT 1 VIEW  Result Date: 11/24/2019 CLINICAL DATA:  Seizure EXAM: PORTABLE CHEST 1 VIEW COMPARISON:  11/04/2019, 11/13/2017, 09/16/2019 FINDINGS: Left-sided pacing device as before. Enlarged cardiomediastinal silhouette with mild vascular congestion. Mild diffuse chronic interstitial opacity. No consolidation or effusion. No pneumothorax. IMPRESSION: Cardiomegaly with mild vascular congestion. No confluent airspace disease. Electronically Signed   By: Donavan Foil M.D.   On: 11/24/2019 18:40   ECHOCARDIOGRAM COMPLETE  Result Date: 11/26/2019    ECHOCARDIOGRAM REPORT   Patient Name:   NAHSHON REICH [facility] Date of Exam: 11/26/2019 Medical Rec #:  [id number]         Height:       72.0 in Accession #:    [id number]        Weight:       223.3 lb Date of Birth:  03-Feb-1923          BSA:          2.233 m Patient Age:    86 years          BP:           148/63 mmHg Patient Gender: M                 HR:           64 bpm. Exam Location:  Inpatient Procedure: 2D Echo Indications:    dyspnea  History:        Patient has no prior history of Echocardiogram examinations.                 Pacemaker; Risk Factors:Former Smoker and Hypertension. Hx of  ablation.  Sonographer:    Jannett Celestine RDCS (AE) Referring Phys: [place]  Sonographer Comments: Restricted mobility. off axis apical windows. unable to reposition patient. exam performed supine IMPRESSIONS  1. Left ventricular ejection fraction, by estimation, is 45 to 50%. The left ventricle has mildly decreased function. The left ventricle has no regional wall motion abnormalities. There is mild left ventricular hypertrophy. Left ventricular diastolic parameters are  indeterminate.  2. Right ventricular systolic function is normal. The right ventricular size is mildly enlarged. There is moderately elevated pulmonary artery systolic pressure. The estimated right ventricular systolic pressure is 26.2 mmHg.  3. The mitral valve is degenerative. Mild mitral valve regurgitation. No evidence of mitral stenosis. Moderate mitral annular calcification.  4. The aortic valve is normal in structure. Aortic valve regurgitation is mild. No aortic stenosis is present.  5. Aortic dilatation noted. There is borderline dilatation at the level of the sinuses of Valsalva, measuring 38 mm.  6. The inferior vena cava is normal in size with greater than 50% respiratory variability, suggesting right atrial pressure of 3 mmHg. FINDINGS  Left Ventricle: Left ventricular ejection fraction, by estimation, is 45 to 50%. The left ventricle has mildly decreased function. The left ventricle has no regional wall motion abnormalities. The left ventricular internal cavity size was normal in size. There is mild left ventricular hypertrophy. Abnormal (paradoxical) septal motion, consistent with RV pacemaker. Left ventricular diastolic parameters are indeterminate.  LV Wall Scoring: The entire apex is akinetic. The mid inferoseptal segment is hypokinetic. The basal anteroseptal segment, basal inferolateral segment, basal anterolateral segment, basal anterior segment, basal inferior segment, and basal inferoseptal segment are normal. Right Ventricle: The right ventricular size is mildly enlarged. No increase in right ventricular wall thickness. Right ventricular systolic function is normal. There is moderately elevated pulmonary artery systolic pressure. The tricuspid regurgitant velocity is 3.66 m/s, and with an assumed right atrial pressure of 3 mmHg, the estimated right ventricular systolic pressure is 03.5 mmHg. Left Atrium: Left atrial size was normal in size. Right Atrium: Right atrial size was normal in size.  Pericardium: There is no evidence of pericardial effusion. Mitral Valve: The mitral valve is degenerative in appearance. There is mild thickening of the mitral valve leaflet(s). There is mild calcification of the mitral valve leaflet(s). Moderate mitral annular calcification. Mild mitral valve regurgitation. No evidence of mitral valve stenosis. Tricuspid Valve: The tricuspid valve is normal in structure. Tricuspid valve regurgitation is trivial. No evidence of tricuspid stenosis. Aortic Valve: The aortic valve is normal in structure. Aortic valve regurgitation is mild. No aortic stenosis is present. Pulmonic Valve: The pulmonic valve was normal in structure. Pulmonic valve regurgitation is trivial. No evidence of pulmonic stenosis. Aorta: Aortic dilatation noted. There is borderline dilatation at the level of the sinuses of Valsalva, measuring 38 mm. Venous: The inferior vena cava is normal in size with greater than 50% respiratory variability, suggesting right atrial pressure of 3 mmHg. IAS/Shunts: No atrial level shunt detected by color flow Doppler. Additional Comments: A pacer wire is visualized.  LEFT VENTRICLE PLAX 2D LVIDd:         4.50 cm LVIDs:         3.10 cm LV PW:         1.30 cm LV IVS:        1.40 cm LVOT diam:     2.30 cm LV SV:         54 LV SV Index:   24 LVOT Area:  4.15 cm  RIGHT VENTRICLE RV S prime:     8.70 cm/s TAPSE (M-mode): 1.5 cm LEFT ATRIUM           Index       RIGHT ATRIUM           Index LA diam:      3.60 cm 1.61 cm/m  RA Area:     19.90 cm LA Vol (A4C): 39.5 ml 17.69 ml/m RA Volume:   49.50 ml  22.17 ml/m  AORTIC VALVE LVOT Vmax:   59.40 cm/s LVOT Vmean:  37.900 cm/s LVOT VTI:    0.130 m  AORTA Ao Root diam: 3.80 cm MITRAL VALVE               TRICUSPID VALVE MV Area (PHT): 2.32 cm    TR Peak grad:   53.6 mmHg MV Decel Time: 327 msec    TR Vmax:        366.00 cm/s MV E velocity: 79.70 cm/s                            SHUNTS                            Systemic VTI:  0.13 m                             Systemic Diam: 2.30 cm Candee Furbish MD Electronically signed by Candee Furbish MD Signature Date/Time: 11/26/2019/11:31:27 AM    Final      Time coordinating discharge: Over 30 minutes    Dwyane Dee, MD  [facility] 11/30/2019, 2:58 PM Pager: Secure chat  If 7PM-7AM, please contact night-coverage [email or web address] Password [email or web address]

## 2019-11-30 NOTE — Assessment & Plan Note (Signed)
-   likely multifactorial at this time with underlying elevated BUN contributing as well as advanced age/hospitalization and at risk for delirium  - he has been pretty AO with proper mentation during my discussions but caregiver does note intermittent periods of confusion; will have to keep watching this and address as able; trend of BUN varies with looking back ranging 40s-70s this past year

## 2019-11-30 NOTE — TOC Transition Note (Signed)
Transition of Care [facility]) - CM/SW Discharge Note   Patient Details  Name: JAIKOB BORGWARDT MRN: [id number] Date of Birth: January 09, 1923  Transition of Care [facility]) CM/SW Contact:  [name], LCSW Phone Number: 11/30/2019, 3:39 PM   Clinical Narrative:   Patient to dc with home health. Home health arranged with [facility].     Final next level of care: [place] Barriers to Discharge: No Barriers Identified   Patient Goals and CMS Choice Patient states their goals for this hospitalization and ongoing recovery are:: go home CMS Medicare.gov Compare Post Acute Care list provided to:: Patient Represenative (must comment) Choice offered to / list presented to : Adult Children  Discharge Placement                       Discharge Plan and Services   Discharge Planning Services: CM Consult            DME Arranged: N/A DME Agency: NA       HH Arranged: PT HH Agency: [place] Date [facility] Agency Contacted: 11/30/19 Time [place]: [place] Representative spoke with at [place]: [place] Determinants of Health ([place]) Interventions     Readmission Risk Interventions No flowsheet data found.

## 2019-11-30 NOTE — Progress Notes (Signed)
[facility] Progress Note  Name: [name] MRN: [id number] DOB: Jul 27, 1922  Chief Complaint:  Low sodium at PCP   Subjective:  Had 1.5 liters UOP over 10/2.  He states he feels "1000% better".  His grandson is visiting at bedside.  He denies any difficulty urinating.    Review of systems:   He states shortness of breath is better; no chest pain  Denies any more n/v  ------------ Background on consult:  [name] is a 86 y.o. male with a history of BPH, CKD, chronic combined systolic and diastolic CHF, and HTN who presented to the hospital after abnormal labs at his physician's office.  He was found to have a sodium of 112 on labs with PCP per report.  He received 1 L normal saline bolus in the ER.  Sodium went from 115 prebolus to 118 after the bolus.  He is on Lasix and metolazone as below.  Note that he has a history of prior hyponatremia per charting.  Baseline Cr near 2 recently.  He had the labs today at his PCP's office.  His daughter is at bedside and serves as Risk manager.  He has had some nausea and has gotten a PRN ordered.  His daughter states that she has noticed some confusion.  The patient states that he was taken off of his Lasix on Friday 9/24 as he was not eating well.  He has been trying to drink lots of Gatorade.  He initially tells me he has never had a suprapubic catheter and then recalls that yes at one point he did.     Intake/Output Summary (Last 24 hours) at 11/30/2019 1401 Last data filed at 11/30/2019 1334 Gross per 24 hour  Intake 240 ml  Output 700 ml  Net -460 ml    Vitals:  Vitals:   11/30/19 0500 11/30/19 1018 11/30/19 1029 11/30/19 1309  BP: (!) 138/109 (!) 134/48    Pulse: 70     Resp: 18     Temp: 98.6 F (37 C)   (!) 97.1 F (36.2 C)  TempSrc: Oral   Oral  SpO2: 99%  94% 97%  Weight: 98.8 kg     Height:         Physical Exam:   General: elderly male in bed in NAD HEENT: NCAT Neck: no JVD supple trachea  midline Heart: S1S2 no rub Lungs: anteriorly clear with transmitted upper airway sounds; unlabored at rest on room air Abdomen: soft nontender obese habitus no suprapubic Extremities: no pitting edema appreciated Neuro: awake and conversant; oriented to person, time, location.  hard of hearing provides hx  Psych no anxiety or agitation  GU foley is out    Medications reviewed   Labs:  BMP Latest Ref Rng & Units 11/30/2019 11/29/2019 11/29/2019  Glucose 70 - 99 mg/dL 142(H) 160(H) 200(H)  BUN 8 - 23 mg/dL 71(H) 73(H) 71(H)  Creatinine 0.61 - 1.24 mg/dL 1.64(H) 1.87(H) 1.84(H)  BUN/Creat Ratio 10 - 24 - - -  Sodium 135 - 145 mmol/L 134(L) 134(L) 131(L)  Potassium 3.5 - 5.1 mmol/L 3.5 3.5 3.6  Chloride 98 - 111 mmol/L 90(L) 87(L) 89(L)  CO2 22 - 32 mmol/L 29 32 29  Calcium 8.9 - 10.3 mg/dL 9.2 9.2 8.9     Assessment/Plan:   #Hyponatremia - Improving on presentation to ER and s/p 1 L bolus normal saline and NS at 50 /hr; NS was stopped when Na reached 122 per order.  tolvaptan 7.5  mg once on 9/29.  Got lasix 60 mg IV once on 9/30 then had lasix 80 mg IV once and tolvaptan 15 mg once on 10/1.  Later lasix 80 mg IV BID on 10/2 and got lasix 80 mg IV once on 10/3  - Improving on current regimen  - lasix 80 mg PO BID to start back on 10/4.  Defer metolazone for now - may need to add back per trends and symptoms- will assess at follow-up - reassess transition to PO lasix soon  - TSH slightly up at 8.286 - levothyroxine per primary team   # Urinary retention - hx BPH on finasteride - foley is out - monitor for retention  # CKD stage 4 - baseline Cr is 2    - they request follow-up in office with [facility] on discharge   # HTN  - controlled on current regimen - would discharge on same   # AMS - hyponatremia is improving - may have component of delirium   # Chronic combined systolic and diastolic CHF  - noted on outpatient diuretic regimen of 80 mg BID and metolazone twice  a week (had been very swollen 6-8 weeks ago per caregiver) - diuretics as above  - strict ins/outs and daily weights  # Hypokalemia  - will start potassium 10 meq daily for now - doesn't appear was on at home  # hypophos  - is on regular diet; s/p iv repletion; resolved  Disposition per primary team.  Nephrology will set up hospital follow-up at [facility] as well as routine CKD care.  I spoke with his daughter [name] to update her.  She has asked that we call his caregiver at [phone] [name]) to set up his hospital follow-up appt    Claudia Desanctis, MD 11/30/2019 2:28 PM

## 2019-11-30 NOTE — Progress Notes (Signed)
[name] left in no distress. AVS documentation was provided to patient, the grandson [name], and the homecare caregiver. Medication changes were explained, and AVS printout was explained. The Caregiver and the grandson stated that they had no further questions.

## 2019-11-30 NOTE — Assessment & Plan Note (Signed)
-   steady uptrend over past few days; no bands noted; low suspicion for infection at this time; did have hematuria however on Eliquis as well. Does have atelectasis on CXR which may be causing reactive response as well - continue to monitor for s/s infection and if fever will get cultures, but no indication for empiric abx at this time

## 2019-12-01 LAB — T3: T3, Total: 34 ng/dL — ABNORMAL LOW (ref 71–180)

## 2019-12-12 ENCOUNTER — Other Ambulatory Visit: Payer: Self-pay | Admitting: Pulmonary Disease

## 2019-12-15 ENCOUNTER — Other Ambulatory Visit: Payer: Self-pay | Admitting: Nurse Practitioner

## 2019-12-16 ENCOUNTER — Telehealth: Payer: Self-pay | Admitting: Pulmonary Disease

## 2019-12-16 MED ORDER — IPRATROPIUM-ALBUTEROL 0.5-2.5 (3) MG/3ML IN SOLN: 3.0000 mL | Freq: Four times a day (QID) | RESPIRATORY_TRACT | 3 refills | Status: DC | PRN

## 2019-12-16 NOTE — Telephone Encounter (Signed)
Spoke with [name] with Hospice. States that pt is having issues with increased shortness of breath in the evenings. Hospice is asking if Dr. Vaughan Browner would prescribe a nebulized medication for his to use prn when this happens.  Dr. Vaughan Browner - please advise. Thanks.

## 2019-12-16 NOTE — Telephone Encounter (Signed)
LMTCB x1 for Almyra Free.

## 2019-12-16 NOTE — Telephone Encounter (Signed)
Spoke with Almyra Free to let her know that Dr. Vaughan Browner gave [place] the OK to send in RX for Duoneb every 6 hours as needed. She verified preferred pharmacy. States that patient already has nebulizer machine. RX sent in. Nothing further needed at this time.

## 2019-12-16 NOTE — Telephone Encounter (Signed)
Yes. Please send in prescription for duo nebs every 6 hours as needed for dyspnea, wheezing

## 2019-12-17 ENCOUNTER — Ambulatory Visit: Payer: Medicare Other | Admitting: Primary Care

## 2019-12-19 ENCOUNTER — Other Ambulatory Visit: Payer: Self-pay | Admitting: *Deleted

## 2019-12-19 MED ORDER — FLUTICASONE PROPIONATE 50 MCG/ACT NA SUSP: 2.0000 | Freq: Every day | NASAL | 2 refills | Status: DC

## 2019-12-31 ENCOUNTER — Telehealth: Payer: Self-pay | Admitting: Pulmonary Disease

## 2019-12-31 MED ORDER — AZITHROMYCIN 250 MG PO TABS: ORAL_TABLET | ORAL | 0 refills | Status: AC

## 2019-12-31 NOTE — Telephone Encounter (Signed)
Please send in z pack

## 2019-12-31 NOTE — Telephone Encounter (Signed)
Called and spoke with Almyra Free from [facility] letting her know the info stated by Dr. Vaughan Browner and stated to her that we were going to send zpak abx to pharmacy for pt. Almyra Free verbalized understanding. Nothing further needed.

## 2019-12-31 NOTE — Telephone Encounter (Signed)
Spoke with Almyra Free with [facility]  Pt c/o cough with green sputum and increased DOE or lying flat x 2 days  His sats on ra at rest are 88-90% but 85% on exertion so he has been using his o2 2lpm more  He is not having any fevers or aches  Almyra Free asking for abx order  She wanted to let Dr Vaughan Browner know that they have started the pt on metalozone a few x per wk to help with fluid issue  Please advise, thanks!

## 2020-01-13 ENCOUNTER — Ambulatory Visit: Payer: Medicare Other | Admitting: Nurse Practitioner

## 2020-01-19 ENCOUNTER — Other Ambulatory Visit: Payer: Self-pay | Admitting: Pulmonary Disease

## 2020-01-28 DEATH — deceased

## 2020-02-03 ENCOUNTER — Ambulatory Visit: Payer: Medicare Other | Admitting: Podiatry

## 2020-03-21 ENCOUNTER — Other Ambulatory Visit: Payer: Self-pay | Admitting: Pulmonary Disease

## 2021-04-13 ENCOUNTER — Telehealth: Payer: Self-pay

## 2021-04-13 NOTE — Telephone Encounter (Signed)
Pt called to see when his appt with Dr. Stanford Breed is. Pt was at HD and very inaudible. I asked to have the HD nurse call us, as I do not see any upcoming appts or canceled appts, nor any referral. HD nurse with him will call us back.
